# Patient Record
Sex: Female | Born: 1995 | Race: White | Hispanic: No | Marital: Single | Smoking: Former smoker
Health system: Southern US, Community
[De-identification: ages and names within clinical notes are randomized; demographics above are authoritative.]

## PROBLEM LIST (undated history)

## (undated) VITALS — BP 103/64 | HR 104 | Temp 98.4°F | Resp 16 | Ht 63.5 in | Wt 119.0 lb

## (undated) DIAGNOSIS — F32A Depression, unspecified: Secondary | ICD-10-CM

## (undated) DIAGNOSIS — K219 Gastro-esophageal reflux disease without esophagitis: Secondary | ICD-10-CM

## (undated) DIAGNOSIS — J45909 Unspecified asthma, uncomplicated: Secondary | ICD-10-CM

## (undated) DIAGNOSIS — F329 Major depressive disorder, single episode, unspecified: Secondary | ICD-10-CM

## (undated) DIAGNOSIS — N809 Endometriosis, unspecified: Secondary | ICD-10-CM

## (undated) DIAGNOSIS — T7840XA Allergy, unspecified, initial encounter: Secondary | ICD-10-CM

## (undated) HISTORY — PX: MYRINGOTOMY: SUR874

## (undated) HISTORY — PX: ADENOIDECTOMY: SUR15

## (undated) HISTORY — PX: WRIST SURGERY: SHX841

## (undated) HISTORY — PX: TONSILLECTOMY AND ADENOIDECTOMY: SUR1326

---
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
## 2016-01-20 DIAGNOSIS — Z7189 Other specified counseling: Secondary | ICD-10-CM

## 2016-01-20 HISTORY — DX: Other specified counseling: Z71.89

## 2016-03-12 DIAGNOSIS — O99333 Smoking (tobacco) complicating pregnancy, third trimester: Secondary | ICD-10-CM

## 2016-03-12 DIAGNOSIS — O99323 Drug use complicating pregnancy, third trimester: Secondary | ICD-10-CM

## 2016-03-12 DIAGNOSIS — O283 Abnormal ultrasonic finding on antenatal screening of mother: Secondary | ICD-10-CM

## 2016-03-12 HISTORY — DX: Drug use complicating pregnancy, third trimester: O99.323

## 2016-03-12 HISTORY — DX: Smoking (tobacco) complicating pregnancy, third trimester: O99.333

## 2016-03-12 HISTORY — DX: Abnormal ultrasonic finding on antenatal screening of mother: O28.3

## 2017-08-18 DIAGNOSIS — F419 Anxiety disorder, unspecified: Secondary | ICD-10-CM

## 2017-08-18 HISTORY — DX: Anxiety disorder, unspecified: F41.9

## 2019-03-15 ENCOUNTER — Other Ambulatory Visit: Payer: Self-pay

## 2019-03-15 ENCOUNTER — Encounter (HOSPITAL_COMMUNITY): Payer: Self-pay

## 2019-03-15 ENCOUNTER — Emergency Department (HOSPITAL_COMMUNITY)
Admission: EM | Admit: 2019-03-15 | Discharge: 2019-03-15 | Disposition: A | Discharge status: Home / Self Care | Payer: PRIVATE HEALTH INSURANCE | Attending: Emergency Medicine | Admitting: Emergency Medicine

## 2019-03-15 DIAGNOSIS — F17210 Nicotine dependence, cigarettes, uncomplicated: Secondary | ICD-10-CM | POA: Insufficient documentation

## 2019-03-15 DIAGNOSIS — A6000 Herpesviral infection of urogenital system, unspecified: Secondary | ICD-10-CM

## 2019-03-15 DIAGNOSIS — N760 Acute vaginitis: Secondary | ICD-10-CM | POA: Insufficient documentation

## 2019-03-15 DIAGNOSIS — B9689 Other specified bacterial agents as the cause of diseases classified elsewhere: Secondary | ICD-10-CM

## 2019-03-15 DIAGNOSIS — J45909 Unspecified asthma, uncomplicated: Secondary | ICD-10-CM | POA: Insufficient documentation

## 2019-03-15 DIAGNOSIS — Z79899 Other long term (current) drug therapy: Secondary | ICD-10-CM | POA: Insufficient documentation

## 2019-03-15 LAB — URINALYSIS, ROUTINE W REFLEX MICROSCOPIC
Bilirubin Urine: NEGATIVE
Glucose, UA: NEGATIVE mg/dL
Ketones, ur: NEGATIVE mg/dL
Nitrite: NEGATIVE
Protein, ur: NEGATIVE mg/dL
Specific Gravity, Urine: 1.016 (ref 1.005–1.030)
pH: 6 (ref 5.0–8.0)

## 2019-03-15 LAB — WET PREP, GENITAL
Sperm: NONE SEEN
Trich, Wet Prep: NONE SEEN
Yeast Wet Prep HPF POC: NONE SEEN

## 2019-03-15 LAB — POC URINE PREG, ED: Preg Test, Ur: NEGATIVE

## 2019-03-15 MED ORDER — CEFTRIAXONE SODIUM 250 MG IJ SOLR
250.0000 mg | Freq: Once | INTRAMUSCULAR | Status: AC
Start: 2019-03-15 — End: 2019-03-15
  Administered 2019-03-15: 250 mg via INTRAMUSCULAR
  Filled 2019-03-15: qty 250

## 2019-03-15 MED ORDER — METRONIDAZOLE 500 MG PO TABS: 500.0000 mg | ORAL_TABLET | Freq: Two times a day (BID) | ORAL | 0 refills | Status: DC

## 2019-03-15 MED ORDER — LIDOCAINE HCL (PF) 1 % IJ SOLN
INTRAMUSCULAR | Status: AC
  Administered 2019-03-15: 0.9 mL
  Filled 2019-03-15: qty 5

## 2019-03-15 MED ORDER — AZITHROMYCIN 250 MG PO TABS
1000.0000 mg | ORAL_TABLET | Freq: Once | ORAL | Status: AC
  Administered 2019-03-15: 1000 mg via ORAL
  Filled 2019-03-15: qty 4

## 2019-03-15 MED ORDER — ACYCLOVIR 400 MG PO TABS: 400.0000 mg | ORAL_TABLET | Freq: Four times a day (QID) | ORAL | 0 refills | Status: DC

## 2019-03-15 NOTE — Discharge Instructions (Signed)
Your rash is likely due to genital herpes.  Take acyclovir as prescribed for the full duration. Take flagyl for bacterial vaginosis.  Avoid sexual activity until symptoms resolved.

## 2019-03-15 NOTE — ED Triage Notes (Signed)
Pt from home c/o vaginal irritation, anal itching; started period Sunday, then next am vaginal noted irritation, bumps, burning while urinating; cold chills, migraine x 2 days; pt endorses darker than normal urine; denies sick contacts, denies cough

## 2019-03-15 NOTE — ED Notes (Signed)
Patient verbalizes understanding of discharge instructions. Opportunity for questioning and answers were provided. Pt discharged from ED. 

## 2019-03-15 NOTE — ED Provider Notes (Signed)
[facility] Provider Note   CSN: [id number] Arrival date & time: 03/15/19  1111     History   Chief Complaint Chief Complaint  Patient presents with  . Vaginal Itching    HPI Tracy Guzman is a 23 y.o. female.     The history is provided by the patient. No language interpreter was used.  Vaginal Itching     23 year old female presenting with complaints of vaginal discomfort.  Patient report for the past 2 to 3 days she has noticed some irritation around her vaginal area in her rectal area.  States she noticed several bumps as long as the itching sensation.  She also endorsed occasional bouts of cold chills and mild throbbing headache.  She is currently on her menstrual.  And did use a different types of tampons and pads.  She does not complain of any fever, nausea, vomiting, diarrhea, light/sound sensitivity, URI symptoms, burning with urination or rectal bleeding.  She is currently sexually active and using protection each time.  She however has had 5 different sexual partners within the past 6 months.  She is a G2 P2.  She did report remote history of pelvic inflammatory disease.   Past Medical History:  Diagnosis Date  . Allergy    Smoke, dust, Pollen  . Asthma   . Depression   . Endometriosis   . GERD (gastroesophageal reflux disease)     Patient Active Problem List   Diagnosis Date Noted  . MDD (major depressive disorder), recurrent episode, moderate (HCC) 08/16/2013  . PTSD (post-traumatic stress disorder) 08/15/2013  . Oppositional defiant disorder 06/06/2012  . Suicidal ideation 02/12/2012  . Polysubstance abuse (HCC) 02/12/2012  . BACK PAIN 10/16/2010  . ASTHMA 06/23/2010  . GERD 06/23/2010    Past Surgical History:  Procedure Laterality Date  . MYRINGOTOMY    . TONSILLECTOMY AND ADENOIDECTOMY  as infant  . WRIST SURGERY  age 23     OB History   No obstetric history on file.      Home Medications     Prior to Admission medications   Medication Sig Start Date End Date Taking? Authorizing Provider  ARIPiprazole (ABILIFY) 5 MG tablet Take 1 tablet (5 mg total) by mouth at bedtime. 08/22/13   Winson, Louie BunKim B, NP  doxycycline (ORACEA) 40 MG capsule Take 1 capsule (40 mg total) by mouth daily with breakfast. Please return home supply to patient/family.Patient may resume home supply. 08/22/13   Winson, Louie BunKim B, NP  mometasone-formoterol (DULERA) 100-5 MCG/ACT AERO Inhale 2 puffs into the lungs 2 (two) times daily.    [provider]  ondansetron (ZOFRAN ODT) 4 MG disintegrating tablet Take 1 tablet (4 mg total) by mouth every 8 (eight) hours as needed for nausea or vomiting. 05/14/14   Renne CriglerGeiple, Joshua, PA-C  venlafaxine (EFFEXOR) 75 MG tablet Take 1 tablet (75 mg total) by mouth daily with breakfast. 08/22/13   Winson, Louie BunKim B, NP    Family History Family History  Problem Relation Age of Onset  . Depression Mother   . Depression Maternal Grandmother   . Drug abuse Cousin   . Drug abuse Maternal Uncle     Social History Social History   Tobacco Use  . Smoking status: Current Every Day Smoker    Packs/day: 1.00    Years: 1.00    Pack years: 1.00    Types: Cigarettes  . Smokeless tobacco: Never Used  Substance Use Topics  . Alcohol  use: Yes    Comment: occasional  . Drug use: Not Currently    Types: Other-see comments, MDMA (Ecstacy)     Allergies   Lamotrigine   Review of Systems Review of Systems  All other systems reviewed and are negative.    Physical Exam Updated Vital Signs BP 116/71 (BP Location: Right Arm)   Pulse 80   Temp 98.4 F (36.9 C) (Oral)   Resp 16   Ht 5\' 4"  (1.626 m)   Wt 72.6 kg   LMP 03/12/2019   SpO2 98%   BMI 27.46 kg/m   Physical Exam Vitals signs and nursing note reviewed.  Constitutional:      General: She is not in acute distress.    Appearance: She is well-developed. She is not ill-appearing.  HENT:     Head: Atraumatic.  Eyes:      Conjunctiva/sclera: Conjunctivae normal.  Neck:     Musculoskeletal: Neck supple.  Cardiovascular:     Rate and Rhythm: Normal rate and regular rhythm.     Pulses: Normal pulses.     Heart sounds: Normal heart sounds.  Pulmonary:     Effort: Pulmonary effort is normal.     Breath sounds: Normal breath sounds.  Abdominal:     Palpations: Abdomen is soft.     Tenderness: There is no abdominal tenderness.  Genitourinary:    Comments: Chaperone present during exam.  No inguinal lymphadenopathy or inguinal hernia noted.  Multiple vesicular lesion noted around the vaginal labia region bilaterally mildly tender to palpation.  No significant discomfort with speculum insertion.  Cervical os is visualized and closed.  Dystrophic skin changes noted about the with friable skin changes.  Small amount of blood noted in vaginal vault. Mild discharge.  On bimanual examination, bilateral adnexal tenderness without cervical motion tenderness Skin:    Findings: No rash.  Neurological:     Mental Status: She is alert.      ED Treatments / Results  Labs (all labs ordered are listed, but only abnormal results are displayed) Labs Reviewed  WET PREP, GENITAL - Abnormal; Notable for the following components:      Result Value   Clue Cells Wet Prep HPF POC PRESENT (*)    WBC, Wet Prep HPF POC MANY (*)    All other components within normal limits  URINALYSIS, ROUTINE W REFLEX MICROSCOPIC - Abnormal; Notable for the following components:   Hgb urine dipstick LARGE (*)    Leukocytes,Ua SMALL (*)    Bacteria, UA RARE (*)    All other components within normal limits  RPR  HIV ANTIBODY (ROUTINE TESTING W REFLEX)  POC URINE PREG, ED  GC/CHLAMYDIA PROBE AMP ([place]) NOT AT [facility]    EKG None  Radiology No results found.  Procedures Procedures (including critical care time)  Medications Ordered in ED Medications  cefTRIAXone (ROCEPHIN) injection 250 mg (250 mg Intramuscular Given 03/15/19  1333)  azithromycin (ZITHROMAX) tablet 1,000 mg (1,000 mg Oral Given 03/15/19 1332)  lidocaine (PF) (XYLOCAINE) 1 % injection (0.9 mLs  Given 03/15/19 1334)     Initial Impression / Assessment and Plan / ED Course  I have reviewed the triage vital signs and the nursing notes.  Pertinent labs & imaging results that were available during my care of the patient were reviewed by me and considered in my medical decision making (see chart for details).        BP 106/71 (BP Location: Right Arm)   Pulse 69  Temp 98.4 F (36.9 C) (Oral)   Resp 16   Ht 5\' 4"  (1.626 m)   Wt 72.6 kg   LMP 03/12/2019   SpO2 98%   BMI 27.46 kg/m    Final Clinical Impressions(s) / ED Diagnoses   Final diagnoses:  Bacterial vaginosis  Genital herpes simplex, unspecified site    ED Discharge Orders         Ordered    acyclovir (ZOVIRAX) 400 MG tablet  4 times daily     03/15/19 1415    metroNIDAZOLE (FLAGYL) 500 MG tablet  2 times daily     03/15/19 1415         12:57 PM Patient here complaining of vaginal irritation and bumps around her vaginal and rectal region.  She is currently using a new pad for her menstruation which may contribute to her irritation.  Patient however has risky sexual behaviors as she has had 5 sexual partners within the past 6 months.  Therefore, will perform cover examination and evaluate for potential STI  1:13 PM Patient has multiple vesicular lesions that labial region concerning for genital herpes.  She also has discomfort on examination.  Will treat for potential STI with Rocephin and Zithromax.  Patient discharged home with a prescription for the treatment for genital herpes.   Fayrene Helperran, Stacie Templin, PA-C 03/15/19 1417    Gerhard MunchLockwood, Robert, MD 03/18/19 [phone]

## 2019-03-16 LAB — RPR: RPR Ser Ql: NONREACTIVE

## 2019-03-16 LAB — HIV ANTIBODY (ROUTINE TESTING W REFLEX): HIV Screen 4th Generation wRfx: NONREACTIVE

## 2019-03-17 LAB — GC/CHLAMYDIA PROBE AMP (~~LOC~~) NOT AT ARMC
Chlamydia: POSITIVE — AB
Neisseria Gonorrhea: NEGATIVE

## 2019-07-19 ENCOUNTER — Emergency Department (HOSPITAL_COMMUNITY): Admission: EM | Admit: 2019-07-19 | Discharge: 2019-07-19 | Discharge status: Against Medical Advice | Payer: Self-pay

## 2019-07-19 ENCOUNTER — Other Ambulatory Visit: Payer: Self-pay

## 2019-07-30 ENCOUNTER — Other Ambulatory Visit: Payer: Self-pay

## 2019-07-30 ENCOUNTER — Encounter (HOSPITAL_COMMUNITY): Payer: Self-pay | Admitting: Emergency Medicine

## 2019-07-30 ENCOUNTER — Emergency Department (HOSPITAL_COMMUNITY)
Admission: EM | Admit: 2019-07-30 | Discharge: 2019-07-31 | Disposition: A | Discharge status: Home / Self Care | Payer: Self-pay | Attending: Emergency Medicine | Admitting: Emergency Medicine

## 2019-07-30 DIAGNOSIS — E860 Dehydration: Secondary | ICD-10-CM | POA: Insufficient documentation

## 2019-07-30 DIAGNOSIS — R1115 Cyclical vomiting syndrome unrelated to migraine: Secondary | ICD-10-CM | POA: Insufficient documentation

## 2019-07-30 DIAGNOSIS — F17210 Nicotine dependence, cigarettes, uncomplicated: Secondary | ICD-10-CM | POA: Insufficient documentation

## 2019-07-30 DIAGNOSIS — Z79899 Other long term (current) drug therapy: Secondary | ICD-10-CM | POA: Insufficient documentation

## 2019-07-30 DIAGNOSIS — K5900 Constipation, unspecified: Secondary | ICD-10-CM | POA: Insufficient documentation

## 2019-07-30 DIAGNOSIS — J45909 Unspecified asthma, uncomplicated: Secondary | ICD-10-CM | POA: Insufficient documentation

## 2019-07-30 LAB — I-STAT BETA HCG BLOOD, ED (MC, WL, AP ONLY): I-stat hCG, quantitative: <5 m[IU]/mL (ref <5)

## 2019-07-30 LAB — COMPREHENSIVE METABOLIC PANEL
ALT: 19 U/L (ref 0–44)
AST: 19 U/L (ref 15–41)
Albumin: 4.6 g/dL (ref 3.5–5.0)
Alkaline Phosphatase: 56 U/L (ref 38–126)
Anion gap: 14 (ref 5–15)
BUN: <5 mg/dL — ABNORMAL LOW (ref 6–20)
CO2: 25 mmol/L (ref 22–32)
Calcium: 9.9 mg/dL (ref 8.9–10.3)
Chloride: 99 mmol/L (ref 98–111)
Creatinine, Ser: 0.9 mg/dL (ref 0.44–1.00)
GFR calc Af Amer: >60 mL/min (ref >60)
GFR calc non Af Amer: >60 mL/min (ref >60)
Glucose, Bld: 104 mg/dL — ABNORMAL HIGH (ref 70–99)
Potassium: 3.7 mmol/L (ref 3.5–5.1)
Sodium: 138 mmol/L (ref 135–145)
Total Bilirubin: 1.9 mg/dL — ABNORMAL HIGH (ref 0.3–1.2)
Total Protein: 7.7 g/dL (ref 6.5–8.1)

## 2019-07-30 LAB — CBC
HCT: 48.2 % — ABNORMAL HIGH (ref 36.0–46.0)
Hemoglobin: 15.9 g/dL — ABNORMAL HIGH (ref 12.0–15.0)
MCH: 26.2 pg (ref 26.0–34.0)
MCHC: 33 g/dL (ref 30.0–36.0)
MCV: 79.4 fL — ABNORMAL LOW (ref 80.0–100.0)
Platelets: 273 10*3/uL (ref 150–400)
RBC: 6.07 MIL/uL — ABNORMAL HIGH (ref 3.87–5.11)
RDW: 13.9 % (ref 11.5–15.5)
WBC: 5.1 10*3/uL (ref 4.0–10.5)
nRBC: 0 % (ref 0.0–0.2)

## 2019-07-30 LAB — LIPASE, BLOOD: Lipase: 31 U/L (ref 11–51)

## 2019-07-30 MED ORDER — SODIUM CHLORIDE 0.9 % IV BOLUS
1000.0000 mL | Freq: Once | INTRAVENOUS | Status: AC
  Administered 2019-07-30: 1000 mL via INTRAVENOUS

## 2019-07-30 MED ORDER — CAPSAICIN 0.025 % EX CREA
TOPICAL_CREAM | Freq: Two times a day (BID) | CUTANEOUS | Status: DC
  Filled 2019-07-30 (×2): qty 60

## 2019-07-30 MED ORDER — METOCLOPRAMIDE HCL 5 MG/ML IJ SOLN
10.0000 mg | Freq: Once | INTRAMUSCULAR | Status: AC
  Administered 2019-07-30: 10 mg via INTRAVENOUS
  Filled 2019-07-30: qty 2

## 2019-07-30 MED ORDER — DIPHENHYDRAMINE HCL 50 MG/ML IJ SOLN
12.5000 mg | Freq: Once | INTRAMUSCULAR | Status: AC
  Administered 2019-07-30: 12.5 mg via INTRAVENOUS
  Filled 2019-07-30: qty 1

## 2019-07-30 MED ORDER — SODIUM CHLORIDE 0.9% FLUSH: 3.0000 mL | Freq: Once | INTRAVENOUS | Status: DC

## 2019-07-30 NOTE — ED Triage Notes (Addendum)
C/o constipation, nausea, and vomiting x 2 weeks.  Reports generalized abd pain x 4 days.  Used enema this morning without results.

## 2019-07-30 NOTE — ED Provider Notes (Signed)
[facility] Provider Note   CSN: [id number] Arrival date & time: 07/30/19  1851     History   Chief Complaint Chief Complaint  Patient presents with  . Abdominal Pain  . Constipation    HPI Tracy FootmanKaitlyn Doebler is a 23 y.o. female with a hx of GERD, asthma, polysubstance abuse presents to the Emergency Department complaining of gradual, persistent, progressively worsening constipation onset 3 weeks ago.  Pt reports taking laxative and stool softeners, but is producing only mucous. Pt reports associated vomiting for the last 2+ weeks. She reports she cannot keep any food down.  Pt reports hx of pyelonephritis and completed abx yesterday.  Pt reports NBNB emesis approx 6 times per day.  Pt attempted enema this AM without BM.  She reports lower abd pain onset 3 days ago.  She denies hx of this, but does state hx of IBS.  Pt denies EtOH, any drug usage.  Pt reports 1ppd of cigarettes.  She reports she used to smoke marijuana but stopped 3 weeks ago. Nothing seems to make her symptoms better.  She denies fever, chills, headache, neck pain, chest pain, diarrhea, weakness, dizziness, syncope, dysuria.    History reviewed patient was [facility] on 07/20/2019 and 07/22/2019.  At that time she was diagnosed with pyelonephritis and cannabinoid hyperemesis.  She had a CT stone study on 07/20/2019 which showed a tiny nonobstructing renal stone on the right.  On 07/22/2019 she had a pelvic ultrasound and Doppler which did not show ovarian torsion, TOA or ovarian cyst.  UDS on 07/22/2019 showed THC and opiates.  She was given Rocephin in the emergency department and discharged as she had no additional vomiting.     The history is provided by the patient. No language interpreter was used.    Past Medical History:  Diagnosis Date  . Allergy    Smoke, dust, Pollen  . Asthma   . Depression   . Endometriosis   . GERD  (gastroesophageal reflux disease)     Patient Active Problem List   Diagnosis Date Noted  . MDD (major depressive disorder), recurrent episode, moderate (HCC) 08/16/2013  . PTSD (post-traumatic stress disorder) 08/15/2013  . Oppositional defiant disorder 06/06/2012  . Suicidal ideation 02/12/2012  . Polysubstance abuse (HCC) 02/12/2012  . BACK PAIN 10/16/2010  . ASTHMA 06/23/2010  . GERD 06/23/2010    Past Surgical History:  Procedure Laterality Date  . MYRINGOTOMY    . TONSILLECTOMY AND ADENOIDECTOMY  as infant  . WRIST SURGERY  age 23     OB History   No obstetric history on file.      Home Medications    Prior to Admission medications   Medication Sig Start Date End Date Taking? Authorizing Provider  acyclovir (ZOVIRAX) 400 MG tablet Take 1 tablet (400 mg total) by mouth 4 (four) times daily. 03/15/19   Fayrene Helperran, [place], PA-C  ARIPiprazole (ABILIFY) 5 MG tablet Take 1 tablet (5 mg total) by mouth at bedtime. 08/22/13   Winson, Louie BunKim B, NP  doxycycline (ORACEA) 40 MG capsule Take 1 capsule (40 mg total) by mouth daily with breakfast. Please return home supply to patient/family.Patient may resume home supply. 08/22/13   Winson, Louie BunKim B, NP  metoCLOPramide (REGLAN) 10 MG tablet Take 1 tablet (10 mg total) by mouth every 6 (six) hours as needed for nausea or vomiting. 07/31/19   Zonnique Norkus, Dahlia ClientHannah, PA-C  metroNIDAZOLE (FLAGYL) 500 MG tablet Take 1 tablet (  500 mg total) by mouth 2 (two) times daily. 03/15/19   Fayrene Helper, PA-C  mometasone-formoterol (DULERA) 100-5 MCG/ACT AERO Inhale 2 puffs into the lungs 2 (two) times daily.    [provider]  omeprazole (PRILOSEC) 20 MG capsule Take 1 capsule (20 mg total) by mouth daily. 07/31/19   Jaydee Conran, Dahlia Client, PA-C  ondansetron (ZOFRAN ODT) 4 MG disintegrating tablet Take 1 tablet (4 mg total) by mouth every 8 (eight) hours as needed for nausea or vomiting. 05/14/14   Renne Crigler, PA-C  promethazine (PHENERGAN) 25 MG suppository  Place 1 suppository (25 mg total) rectally every 6 (six) hours as needed for nausea or vomiting. 07/31/19   Peirce Deveney, Dahlia Client, PA-C  sucralfate (CARAFATE) 1 g tablet Take 1 tablet (1 g total) by mouth 4 (four) times daily -  with meals and at bedtime. 07/31/19   Iam Lipson, Dahlia Client, PA-C  venlafaxine (EFFEXOR) 75 MG tablet Take 1 tablet (75 mg total) by mouth daily with breakfast. 08/22/13   Jolene Schimke, NP    Family History Family History  Problem Relation Age of Onset  . Depression Mother   . Depression Maternal Grandmother   . Drug abuse Cousin   . Drug abuse Maternal Uncle     Social History Social History   Tobacco Use  . Smoking status: Current Every Day Smoker    Packs/day: 1.00    Years: 1.00    Pack years: 1.00    Types: Cigarettes  . Smokeless tobacco: Never Used  Substance Use Topics  . Alcohol use: Yes    Comment: occasional  . Drug use: Not Currently    Types: Other-see comments, MDMA (Ecstacy)     Allergies   Lamotrigine   Review of Systems Review of Systems  Constitutional: Negative for appetite change, diaphoresis, fatigue, fever and unexpected weight change.  HENT: Negative for mouth sores.   Eyes: Negative for visual disturbance.  Respiratory: Negative for cough, chest tightness, shortness of breath and wheezing.   Cardiovascular: Negative for chest pain.  Gastrointestinal: Positive for abdominal pain, constipation, nausea and vomiting. Negative for diarrhea.  Endocrine: Negative for polydipsia, polyphagia and polyuria.  Genitourinary: Negative for dysuria, frequency, hematuria and urgency.  Musculoskeletal: Negative for back pain and neck stiffness.  Skin: Negative for rash.  Allergic/Immunologic: Negative for immunocompromised state.  Neurological: Negative for syncope, light-headedness and headaches.  Hematological: Does not bruise/bleed easily.  Psychiatric/Behavioral: Negative for sleep disturbance. The patient is not nervous/anxious.       Physical Exam Updated Vital Signs BP 116/70   Pulse 69   Temp 98.8 F (37.1 C) (Oral)   Resp 17   LMP 07/09/2019   SpO2 100%   Physical Exam Vitals signs and nursing note reviewed.  Constitutional:      General: She is not in acute distress.    Appearance: She is not diaphoretic.  HENT:     Head: Normocephalic.  Eyes:     General: No scleral icterus.    Conjunctiva/sclera: Conjunctivae normal.  Neck:     Musculoskeletal: Normal range of motion.  Cardiovascular:     Rate and Rhythm: Normal rate and regular rhythm.     Pulses: Normal pulses.          Radial pulses are 2+ on the right side and 2+ on the left side.  Pulmonary:     Effort: No tachypnea, accessory muscle usage, prolonged expiration, respiratory distress or retractions.     Breath sounds: No stridor.  Comments: Equal chest rise. No increased work of breathing. Abdominal:     General: There is no distension.     Palpations: Abdomen is soft.     Tenderness: There is no abdominal tenderness. There is no right CVA tenderness, left CVA tenderness, guarding or rebound. Negative signs include Murphy's sign and McBurney's sign.  Musculoskeletal:     Comments: Moves all extremities equally and without difficulty.  Skin:    General: Skin is warm and dry.     Capillary Refill: Capillary refill takes less than 2 seconds.  Neurological:     Mental Status: She is alert.     GCS: GCS eye subscore is 4. GCS verbal subscore is 5. GCS motor subscore is 6.     Comments: Speech is clear and goal oriented.  Psychiatric:        Mood and Affect: Mood normal.      ED Treatments / Results  Labs (all labs ordered are listed, but only abnormal results are displayed) Labs Reviewed  COMPREHENSIVE METABOLIC PANEL - Abnormal; Notable for the following components:      Result Value   Glucose, Bld 104 (*)    BUN <5 (*)    Total Bilirubin 1.9 (*)    All other components within normal limits  CBC - Abnormal; Notable for  the following components:   RBC 6.07 (*)    Hemoglobin 15.9 (*)    HCT 48.2 (*)    MCV 79.4 (*)    All other components within normal limits  URINALYSIS, ROUTINE W REFLEX MICROSCOPIC - Abnormal; Notable for the following components:   Color, Urine AMBER (*)    APPearance HAZY (*)    Ketones, ur 80 (*)    Protein, ur 100 (*)    All other components within normal limits  RAPID URINE DRUG SCREEN, HOSP PERFORMED - Abnormal; Notable for the following components:   Tetrahydrocannabinol POSITIVE (*)    All other components within normal limits  LIPASE, BLOOD  I-STAT BETA HCG BLOOD, ED ([facility], [facility], [facility] ONLY)     Radiology Dg Abd Acute W/chest  Result Date: 07/31/2019 CLINICAL DATA:  Abdominal pain with constipation and vomiting EXAM: DG ABDOMEN ACUTE W/ 1V CHEST COMPARISON:  None. FINDINGS: There is no evidence of dilated bowel loops or free intraperitoneal air. No radiopaque calculi or other significant radiographic abnormality is seen. Heart size and mediastinal contours are within normal limits. Both lungs are clear. IMPRESSION: Negative abdominal radiographs.  No acute cardiopulmonary disease. Electronically Signed   By: Deatra Robinson M.D.   On: 07/31/2019 00:40    Procedures Procedures (including critical care time)  Medications Ordered in ED Medications  sodium chloride flush (NS) 0.9 % injection 3 mL (3 mLs Intravenous Not Given 07/31/19 0005)  capsaicin (ZOSTRIX) 0.025 % cream ( Topical Not Given 07/31/19 0040)  metoCLOPramide (REGLAN) injection 10 mg (10 mg Intravenous Given 07/30/19 2350)  diphenhydrAMINE (BENADRYL) injection 12.5 mg (12.5 mg Intravenous Given 07/30/19 2351)  sodium chloride 0.9 % bolus 1,000 mL (0 mLs Intravenous Stopped 07/31/19 0057)  sodium chloride 0.9 % bolus 500 mL (0 mLs Intravenous Stopped 07/31/19 0203)     Initial Impression / Assessment and Plan / ED Course  I have reviewed the triage vital signs and the nursing notes.  Pertinent labs & imaging  results that were available during my care of the patient were reviewed by me and considered in my medical decision making (see chart for details).  Clinical Course as of  Jul 31 203  Mon Jul 31, 2019  0045 Linward Foster. Will give additional fluids  Ketones, ur(!): 80 [HM]  0048 Proteinuria.  Noted on previous UAs  Protein(!): 100 [HM]  0048 Tetrahydrocannabinol(!): POSITIVE [HM]  0048 hemoconcentrated  Hemoglobin(!): 15.9 [HM]  0049 WNL  Potassium: 3.7 [HM]  0105 Plain films without evidence of bowel obstruction.  Chest x-ray clear.  I personally evaluated these images.  DG Abd Acute W/Chest [HM]  0202 Patient has tolerated p.o. without difficulty.  On repeat exam her abdomen is soft and nontender.   [HM]    Clinical Course User Index [HM] Shahira Fiske, Jarrett Soho, [place]        Patient presents with complaints of constipation and emesis for several weeks.  She has been evaluated several times in the last few weeks at [facility].  On exam, abdomen soft and nontender.  Highly doubt diverticulitis, colitis, appendicitis, cholecystitis.  Initial labs are overall reassuring.  No electrolyte abnormalities, elevation in AST ALT or elevated anion gap.  Doubt cholecystitis, appendicitis.  Patient does appear to be hemoconcentrated.  Will give fluids.  No leukocytosis.  Patient is not pregnant.  UA and UDS pending.  Plain films without evidence of bowel obstruction.  Clinical picture not consistent with SBO.  I personally evaluated the images.  2:04 AM UA without evidence of urinary tract infection.  No flank pain to suggest recurrent pyelonephritis.  UDS is positive for THC.  Long discussion with patient about cannabinoid induced hyperemesis versus other causes of cyclic vomiting.  She has received fluids here in the emergency department.  No emesis while under my care.  She has tolerated p.o. without difficulty.  Also discussed slowly advancing diet.  She will need GI  follow-up.  Discussed reasons to return to the emergency department.  Final Clinical Impressions(s) / ED Diagnoses   Final diagnoses:  Cyclical vomiting  Dehydration    ED Discharge Orders         Ordered    metoCLOPramide (REGLAN) 10 MG tablet  Every 6 hours PRN     07/31/19 0201    omeprazole (PRILOSEC) 20 MG capsule  Daily     07/31/19 0201    promethazine (PHENERGAN) 25 MG suppository  Every 6 hours PRN     07/31/19 0201    sucralfate (CARAFATE) 1 g tablet  3 times daily with meals & bedtime     07/31/19 0201           Jamari Moten, Jarrett Soho, PA-C 07/31/19 0205    Fatima Blank, MD 07/31/19 [phone]

## 2019-07-31 ENCOUNTER — Emergency Department (HOSPITAL_COMMUNITY): Payer: Self-pay

## 2019-07-31 LAB — URINALYSIS, ROUTINE W REFLEX MICROSCOPIC
Bacteria, UA: NONE SEEN
Bilirubin Urine: NEGATIVE
Glucose, UA: NEGATIVE mg/dL
Hgb urine dipstick: NEGATIVE
Ketones, ur: 80 mg/dL — AB
Leukocytes,Ua: NEGATIVE
Nitrite: NEGATIVE
Protein, ur: 100 mg/dL — AB
Specific Gravity, Urine: 1.029 (ref 1.005–1.030)
pH: 6 (ref 5.0–8.0)

## 2019-07-31 LAB — RAPID URINE DRUG SCREEN, HOSP PERFORMED
Amphetamines: NOT DETECTED
Barbiturates: NOT DETECTED
Benzodiazepines: NOT DETECTED
Cocaine: NOT DETECTED
Opiates: NOT DETECTED
Tetrahydrocannabinol: POSITIVE — AB

## 2019-07-31 MED ORDER — SUCRALFATE 1 G PO TABS: 1.0000 g | ORAL_TABLET | Freq: Three times a day (TID) | ORAL | 0 refills | Status: DC

## 2019-07-31 MED ORDER — SODIUM CHLORIDE 0.9 % IV BOLUS
500.0000 mL | Freq: Once | INTRAVENOUS | Status: AC
  Administered 2019-07-31: 500 mL via INTRAVENOUS

## 2019-07-31 MED ORDER — OMEPRAZOLE 20 MG PO CPDR: 20.0000 mg | DELAYED_RELEASE_CAPSULE | Freq: Every day | ORAL | 0 refills | Status: DC

## 2019-07-31 MED ORDER — PROMETHAZINE HCL 25 MG RE SUPP: 25.0000 mg | Freq: Four times a day (QID) | RECTAL | 0 refills | Status: DC | PRN

## 2019-07-31 MED ORDER — METOCLOPRAMIDE HCL 10 MG PO TABS: 10.0000 mg | ORAL_TABLET | Freq: Four times a day (QID) | ORAL | 0 refills | Status: DC | PRN

## 2019-07-31 NOTE — Discharge Instructions (Signed)
1. Medications: Phenergan suppository, Reglan, Carafate, omeprazole, usual home medications 2. Treatment: rest, drink plenty of fluids, advance diet slowly starting with clear liquids for several days 3. Follow Up: Please followup with your primary doctor in 2 days for discussion of your diagnoses and further evaluation after today's visit; if you do not have a primary care doctor use the resource guide provided to find one; Please return to the ER for persistent vomiting, high fevers or worsening symptoms

## 2019-09-19 ENCOUNTER — Other Ambulatory Visit: Payer: Self-pay

## 2019-09-19 ENCOUNTER — Ambulatory Visit
Admission: EM | Admit: 2019-09-19 | Discharge: 2019-09-19 | Disposition: A | Discharge status: Home / Self Care | Payer: Self-pay | Attending: Emergency Medicine | Admitting: Emergency Medicine

## 2019-09-19 ENCOUNTER — Encounter: Payer: Self-pay | Admitting: Emergency Medicine

## 2019-09-19 DIAGNOSIS — F17200 Nicotine dependence, unspecified, uncomplicated: Secondary | ICD-10-CM

## 2019-09-19 DIAGNOSIS — H65194 Other acute nonsuppurative otitis media, recurrent, right ear: Secondary | ICD-10-CM

## 2019-09-19 DIAGNOSIS — Z20822 Contact with and (suspected) exposure to covid-19: Secondary | ICD-10-CM

## 2019-09-19 DIAGNOSIS — R0981 Nasal congestion: Secondary | ICD-10-CM

## 2019-09-19 MED ORDER — OFLOXACIN 0.3 % OT SOLN: 3.0000 [drp] | Freq: Two times a day (BID) | OTIC | 0 refills | Status: AC

## 2019-09-19 MED ORDER — AMOXICILLIN-POT CLAVULANATE 875-125 MG PO TABS: 1.0000 | ORAL_TABLET | Freq: Two times a day (BID) | ORAL | 0 refills | Status: DC

## 2019-09-19 MED ORDER — FLUCONAZOLE 200 MG PO TABS: 200.0000 mg | ORAL_TABLET | Freq: Once | ORAL | 0 refills | Status: AC

## 2019-09-19 NOTE — ED Triage Notes (Signed)
Pt presents to [facility] for assessment of right ear pain x 3 days with discharge, and this morning states she noted blood.  Pt c/o sore throat, nasal congestion x 4 days.

## 2019-09-19 NOTE — ED Provider Notes (Signed)
[facility]    CSN: [id number] Arrival date & time: 09/19/19  0956      History   Chief Complaint Chief Complaint  Patient presents with  . URI    HPI Tracy Guzman is a 24 y.o. female with history of allergies, asthma, history of ear infections presenting for right ear pain for last 3 days.  Has had preceding sore throat, nasal congestion for last 4 days.  Patient taking OTC medications with adequate relief of symptoms.  Patient states she put a cotton ball in her right ear last night, noticed blood this morning.  Does endorse mild decreased hearing right as compared to left.  Denies trauma, recent travel, prolonged water exposure.  No fever, chills, cough, shortness of breath.  No known Covid contacts.   Past Medical History:  Diagnosis Date  . Allergy    Smoke, dust, Pollen  . Asthma   . Depression   . Endometriosis   . GERD (gastroesophageal reflux disease)     Patient Active Problem List   Diagnosis Date Noted  . MDD (major depressive disorder), recurrent episode, moderate ([place]) 08/16/2013  . PTSD (post-traumatic stress disorder) 08/15/2013  . Oppositional defiant disorder 06/06/2012  . Suicidal ideation 02/12/2012  . Polysubstance abuse ([place]) 02/12/2012  . BACK PAIN 10/16/2010  . ASTHMA 06/23/2010  . GERD 06/23/2010    Past Surgical History:  Procedure Laterality Date  . MYRINGOTOMY    . TONSILLECTOMY AND ADENOIDECTOMY  as infant  . WRIST SURGERY  age 36    OB History   No obstetric history on file.      Home Medications    Prior to Admission medications   Medication Sig Start Date End Date Taking? Authorizing Provider  acyclovir (ZOVIRAX) 400 MG tablet Take 1 tablet (400 mg total) by mouth 4 (four) times daily. 03/15/19   Domenic Moras, PA-C  amoxicillin-clavulanate (AUGMENTIN) 875-125 MG tablet Take 1 tablet by mouth every 12 (twelve) hours. 09/19/19   Hall-Potvin, [place], PA-C  ARIPiprazole (ABILIFY) 5 MG tablet Take 1 tablet (5 mg  total) by mouth at bedtime. 08/22/13   Winson, Manus Rudd, NP  fluconazole (DIFLUCAN) 200 MG tablet Take 1 tablet (200 mg total) by mouth once for 1 dose. May repeat in 72 hours if needed 09/19/19 09/19/19  Hall-Potvin, [place], PA-C  metoCLOPramide (REGLAN) 10 MG tablet Take 1 tablet (10 mg total) by mouth every 6 (six) hours as needed for nausea or vomiting. 07/31/19   Muthersbaugh, Jarrett Soho, PA-C  mometasone-formoterol (DULERA) 100-5 MCG/ACT AERO Inhale 2 puffs into the lungs 2 (two) times daily.    [provider]  ofloxacin (FLOXIN) 0.3 % OTIC solution Place 3 drops into the right ear 2 (two) times daily for 7 days. 09/19/19 09/26/19  Hall-Potvin, [place], PA-C  omeprazole (PRILOSEC) 20 MG capsule Take 1 capsule (20 mg total) by mouth daily. 07/31/19   Muthersbaugh, Jarrett Soho, PA-C  ondansetron (ZOFRAN ODT) 4 MG disintegrating tablet Take 1 tablet (4 mg total) by mouth every 8 (eight) hours as needed for nausea or vomiting. 05/14/14   Carlisle Cater, PA-C  promethazine (PHENERGAN) 25 MG suppository Place 1 suppository (25 mg total) rectally every 6 (six) hours as needed for nausea or vomiting. 07/31/19   Muthersbaugh, Jarrett Soho, PA-C  sucralfate (CARAFATE) 1 g tablet Take 1 tablet (1 g total) by mouth 4 (four) times daily -  with meals and at bedtime. 07/31/19   Muthersbaugh, Jarrett Soho, PA-C  venlafaxine (EFFEXOR) 75 MG tablet Take 1 tablet (75  mg total) by mouth daily with breakfast. 08/22/13   Jolene Schimke, NP    Family History Family History  Problem Relation Age of Onset  . Depression Mother   . Depression Maternal Grandmother   . Drug abuse Cousin   . Drug abuse Maternal Uncle     Social History Social History   Tobacco Use  . Smoking status: Current Every Day Smoker    Packs/day: 1.00    Years: 1.00    Pack years: 1.00    Types: Cigarettes  . Smokeless tobacco: Never Used  Substance Use Topics  . Alcohol use: Yes    Comment: occasional  . Drug use: Not Currently    Types:  Other-see comments, MDMA (Ecstacy)     Allergies   Lamotrigine   Review of Systems As per HPI   Physical Exam Triage Vital Signs ED Triage Vitals [09/19/19 1005]  Enc Vitals Group     BP 108/74     Pulse Rate 85     Resp 16     Temp 97.8 F (36.6 C)     Temp Source Temporal     SpO2 99 %     Weight      Height      Head Circumference      Peak Flow      Pain Score 6     Pain Loc      Pain Edu?      Excl. in GC?    No data found.  Updated Vital Signs BP 108/74 (BP Location: Left Arm)   Pulse 85   Temp 97.8 F (36.6 C) (Temporal)   Resp 16   LMP 09/01/2019   SpO2 99%   Visual Acuity Right Eye Distance:   Left Eye Distance:   Bilateral Distance:    Right Eye Near:   Left Eye Near:    Bilateral Near:     Physical Exam Constitutional:      General: She is not in acute distress.    Appearance: She is normal weight. She is not ill-appearing or diaphoretic.  HENT:     Head: Normocephalic and atraumatic.     Jaw: There is normal jaw occlusion. No tenderness or pain on movement.     Right Ear: Hearing and external ear normal. No tenderness. No mastoid tenderness.     Left Ear: Hearing, tympanic membrane, ear canal and external ear normal. No tenderness. No mastoid tenderness.     Ears:     Comments: Negative tragal tenderness bilaterally.  EAC with moderate edema, erythema.  Does have curd-like discharge.  Unable to visualize TM.    Nose: Nose normal. No nasal deformity, septal deviation or nasal tenderness.     Right Turbinates: Not swollen or pale.     Left Turbinates: Not swollen or pale.     Right Sinus: No maxillary sinus tenderness or frontal sinus tenderness.     Left Sinus: No maxillary sinus tenderness or frontal sinus tenderness.     Comments: Negative sinus tenderness bilaterally    Mouth/Throat:     Lips: Pink. No lesions.     Mouth: Mucous membranes are moist. No injury.     Pharynx: Oropharynx is clear. Uvula midline. No posterior  oropharyngeal erythema or uvula swelling.     Comments: no tonsillar exudate or hypertrophy Eyes:     General: No scleral icterus.    Conjunctiva/sclera: Conjunctivae normal.     Pupils: Pupils are equal, round, and reactive to  light.  Neck:     Comments: Right-sided LAD Cardiovascular:     Rate and Rhythm: Normal rate and regular rhythm.  Pulmonary:     Effort: Pulmonary effort is normal. No respiratory distress.     Breath sounds: No wheezing.  Musculoskeletal:     Cervical back: Normal range of motion and neck supple. Tenderness present. No muscular tenderness.  Lymphadenopathy:     Cervical: Cervical adenopathy present.  Neurological:     Mental Status: She is alert and oriented to person, place, and time.      UC Treatments / Results  Labs (all labs ordered are listed, but only abnormal results are displayed) Labs Reviewed  NOVEL CORONAVIRUS, NAA    EKG   Radiology No results found.  Procedures Procedures (including critical care time)  Medications Ordered in UC Medications - No data to display  Initial Impression / Assessment and Plan / UC Course  I have reviewed the triage vital signs and the nursing notes.  Pertinent labs & imaging results that were available during my care of the patient were reviewed by me and considered in my medical decision making (see chart for details).     Patient afebrile, nontoxic.  Due to nasal congestion, sore throat Covid testing was obtained-patient to quarantine till results are back.  H&P more concerning for AOM vs AOE: We will cover for both with suspected perforated TM as outlined below.  Patient does not PCP to follow-up with: Provided contact information as well as return precautions.  Patient verbalized understanding and is agreeable to plan. Final Clinical Impressions(s) / UC Diagnoses   Final diagnoses:  Nasal congestion  Other recurrent acute nonsuppurative otitis media of right ear     Discharge Instructions      Use ear drops as directed. Take antibiotic with food. May take first diflucan tab w/ last dose of antibiotic & repeat in 3 days if needed. Return for worsening pain, swelling, fever.    ED Prescriptions    Medication Sig Dispense Auth. Provider   ofloxacin (FLOXIN) 0.3 % OTIC solution Place 3 drops into the right ear 2 (two) times daily for 7 days. 5 mL Hall-Potvin, [place], PA-C   amoxicillin-clavulanate (AUGMENTIN) 875-125 MG tablet Take 1 tablet by mouth every 12 (twelve) hours. 14 tablet Hall-Potvin, [place], PA-C   fluconazole (DIFLUCAN) 200 MG tablet Take 1 tablet (200 mg total) by mouth once for 1 dose. May repeat in 72 hours if needed 2 tablet Hall-Potvin, [place], PA-C     PDMP not reviewed this encounter.   Hall-Potvin, [place], [place] 09/19/19 1028

## 2019-09-19 NOTE — Discharge Instructions (Addendum)
Use ear drops as directed. Take antibiotic with food. May take first diflucan tab w/ last dose of antibiotic & repeat in 3 days if needed. Return for worsening pain, swelling, fever.

## 2019-09-20 LAB — NOVEL CORONAVIRUS, NAA: SARS-CoV-2, NAA: NOT DETECTED

## 2019-10-23 ENCOUNTER — Other Ambulatory Visit: Payer: Self-pay

## 2019-10-23 ENCOUNTER — Emergency Department (HOSPITAL_COMMUNITY)
Admission: EM | Admit: 2019-10-23 | Discharge: 2019-10-23 | Disposition: A | Discharge status: Home / Self Care | Payer: Self-pay | Attending: Emergency Medicine | Admitting: Emergency Medicine

## 2019-10-23 ENCOUNTER — Encounter (HOSPITAL_COMMUNITY): Payer: Self-pay | Admitting: Emergency Medicine

## 2019-10-23 DIAGNOSIS — Z202 Contact with and (suspected) exposure to infections with a predominantly sexual mode of transmission: Secondary | ICD-10-CM | POA: Insufficient documentation

## 2019-10-23 DIAGNOSIS — N898 Other specified noninflammatory disorders of vagina: Secondary | ICD-10-CM | POA: Insufficient documentation

## 2019-10-23 DIAGNOSIS — F17210 Nicotine dependence, cigarettes, uncomplicated: Secondary | ICD-10-CM | POA: Insufficient documentation

## 2019-10-23 DIAGNOSIS — Z79899 Other long term (current) drug therapy: Secondary | ICD-10-CM | POA: Insufficient documentation

## 2019-10-23 LAB — URINALYSIS, ROUTINE W REFLEX MICROSCOPIC
Bacteria, UA: NONE SEEN
Bilirubin Urine: NEGATIVE
Glucose, UA: NEGATIVE mg/dL
Ketones, ur: NEGATIVE mg/dL
Leukocytes,Ua: NEGATIVE
Nitrite: NEGATIVE
Protein, ur: NEGATIVE mg/dL
Specific Gravity, Urine: 1.02 (ref 1.005–1.030)
pH: 9 — ABNORMAL HIGH (ref 5.0–8.0)

## 2019-10-23 LAB — WET PREP, GENITAL
Sperm: NONE SEEN
Trich, Wet Prep: NONE SEEN
Yeast Wet Prep HPF POC: NONE SEEN

## 2019-10-23 LAB — HIV ANTIBODY (ROUTINE TESTING W REFLEX): HIV Screen 4th Generation wRfx: NONREACTIVE

## 2019-10-23 LAB — POC URINE PREG, ED: Preg Test, Ur: NEGATIVE

## 2019-10-23 MED ORDER — DOXYCYCLINE HYCLATE 100 MG PO CAPS: 100.0000 mg | ORAL_CAPSULE | Freq: Two times a day (BID) | ORAL | 0 refills | Status: DC

## 2019-10-23 MED ORDER — STERILE WATER FOR INJECTION IJ SOLN
INTRAMUSCULAR | Status: AC
  Filled 2019-10-23: qty 10

## 2019-10-23 MED ORDER — CEFTRIAXONE SODIUM 500 MG IJ SOLR
500.0000 mg | Freq: Once | INTRAMUSCULAR | Status: AC
  Administered 2019-10-23: 500 mg via INTRAMUSCULAR
  Filled 2019-10-23: qty 500

## 2019-10-23 NOTE — ED Notes (Signed)
Patient verbalizes understanding of discharge instructions. Opportunity for questioning and answers were provided. Armband removed by staff, pt discharged from ED ambulatory to home.  

## 2019-10-23 NOTE — Discharge Instructions (Signed)
Please read and follow all provided instructions.  Your diagnoses today include:  1. STD exposure   2. Vaginal irritation     Tests performed today include:  Test for gonorrhea and chlamydia.   Wet prep - no trichomonas or yeast  Test for HIV and syphilis.   You will be notified by telephone with any positive results.   Vital signs. See below for your results today.   Medications:  For treatment of gonorrhea: You were treated with a rocephin (shot) today.   For treatment of chlamydia: Please hold the prescription for doxycycline until the results return.  If you test positive for chlamydia you should fill the doxycycline and take the entire prescription.  If you test negative, you can discard the prescription. You can check your results on the internet through your MyChart. Results typically return in 48 hours.   Home care instructions:  Read educational materials contained in this packet and follow any instructions provided.   You should tell your partners about your infection and avoid having sex for one week to allow time for the medicine to work.  Sexually transmitted disease testing also available at:   [facility] [place], [facility]  [address], [place], phone [phone] or [phone]    Monday - Friday, call for an appointment  Return instructions:   Please return to the Emergency Department if you experience worsening symptoms.   Please return if you have any other emergent concerns.  Additional Information:  Your vital signs today were: BP 108/71 (BP Location: Right Arm)   Pulse 80   Temp 98.3 F (36.8 C) (Oral)   Resp 16   Ht 5\' 4"  (1.626 m)   Wt 63.5 kg   SpO2 100%   BMI 24.03 kg/m  If your blood pressure (BP) was elevated above 135/85 this visit, please have this repeated by your doctor within one month. --------------

## 2019-10-23 NOTE — ED Triage Notes (Signed)
Pt reports exposure to STD and has burning when peeing. Vaginal redness and irration.

## 2019-10-23 NOTE — ED Provider Notes (Signed)
[facility] EMERGENCY DEPARTMENT Provider Note   CSN: [id number] Arrival date & time: 10/23/19  1453     History Chief Complaint  Patient presents with  . Exposure to STD    Tracy Guzman is a 24 y.o. female.  Patient presents with complaint of vaginal irritation and dysuria ongoing over the past several days after being told by a previous partner that they tested positive for sexually transmitted infection.  Patient does not know the specific infection.  She denies any significant vaginal discharge.  No fevers, abdominal pain, diarrhea.  No increased frequency urgency, hematuria.  Patient states that she was last tested for HIV and syphilis about 4 months ago and is requesting testing today.  The onset of this condition was acute. The course is constant. Aggravating factors: none. Alleviating factors: none.           Past Medical History:  Diagnosis Date  . Allergy    Smoke, dust, Pollen  . Asthma   . Depression   . Endometriosis   . GERD (gastroesophageal reflux disease)     Patient Active Problem List   Diagnosis Date Noted  . MDD (major depressive disorder), recurrent episode, moderate (HCC) 08/16/2013  . PTSD (post-traumatic stress disorder) 08/15/2013  . Oppositional defiant disorder 06/06/2012  . Suicidal ideation 02/12/2012  . Polysubstance abuse (HCC) 02/12/2012  . BACK PAIN 10/16/2010  . ASTHMA 06/23/2010  . GERD 06/23/2010    Past Surgical History:  Procedure Laterality Date  . MYRINGOTOMY    . TONSILLECTOMY AND ADENOIDECTOMY  as infant  . WRIST SURGERY  age 38     OB History   No obstetric history on file.     Family History  Problem Relation Age of Onset  . Depression Mother   . Depression Maternal Grandmother   . Drug abuse Cousin   . Drug abuse Maternal Uncle     Social History   Tobacco Use  . Smoking status: Current Every Day Smoker    Packs/day: 1.00    Years: 1.00    Pack years: 1.00    Types:  Cigarettes  . Smokeless tobacco: Never Used  Substance Use Topics  . Alcohol use: Yes    Comment: occasional  . Drug use: Not Currently    Types: Other-see comments, MDMA (Ecstacy)    Home Medications Prior to Admission medications   Medication Sig Start Date End Date Taking? Authorizing Provider  acyclovir (ZOVIRAX) 400 MG tablet Take 1 tablet (400 mg total) by mouth 4 (four) times daily. 03/15/19   Fayrene Helper, PA-C  amoxicillin-clavulanate (AUGMENTIN) 875-125 MG tablet Take 1 tablet by mouth every 12 (twelve) hours. 09/19/19   Hall-Potvin, [place], PA-C  ARIPiprazole (ABILIFY) 5 MG tablet Take 1 tablet (5 mg total) by mouth at bedtime. 08/22/13   Winson, Louie Bun, NP  metoCLOPramide (REGLAN) 10 MG tablet Take 1 tablet (10 mg total) by mouth every 6 (six) hours as needed for nausea or vomiting. 07/31/19   Muthersbaugh, Dahlia Client, PA-C  mometasone-formoterol (DULERA) 100-5 MCG/ACT AERO Inhale 2 puffs into the lungs 2 (two) times daily.    [provider]  omeprazole (PRILOSEC) 20 MG capsule Take 1 capsule (20 mg total) by mouth daily. 07/31/19   Muthersbaugh, Dahlia Client, PA-C  ondansetron (ZOFRAN ODT) 4 MG disintegrating tablet Take 1 tablet (4 mg total) by mouth every 8 (eight) hours as needed for nausea or vomiting. 05/14/14   Renne Crigler, PA-C  promethazine (PHENERGAN) 25 MG suppository Place 1 suppository (  25 mg total) rectally every 6 (six) hours as needed for nausea or vomiting. 07/31/19   Muthersbaugh, Dahlia Client, PA-C  sucralfate (CARAFATE) 1 g tablet Take 1 tablet (1 g total) by mouth 4 (four) times daily -  with meals and at bedtime. 07/31/19   Muthersbaugh, Dahlia Client, PA-C  venlafaxine (EFFEXOR) 75 MG tablet Take 1 tablet (75 mg total) by mouth daily with breakfast. 08/22/13   Jolene Schimke, NP    Allergies    Lamotrigine  Review of Systems   Review of Systems  Constitutional: Negative for fever.  HENT: Negative for sore throat.   Eyes: Negative for discharge.  Gastrointestinal:  Negative for rectal pain.  Genitourinary: Positive for dysuria and vaginal pain (irritation). Negative for frequency, genital sores, pelvic pain, vaginal bleeding and vaginal discharge.  Musculoskeletal: Negative for arthralgias.  Skin: Negative for rash.  Hematological: Negative for adenopathy.    Physical Exam Updated Vital Signs BP 108/71 (BP Location: Right Arm)   Pulse 80   Temp 98.3 F (36.8 C) (Oral)   Resp 16   Ht 5\' 4"  (1.626 m)   Wt 63.5 kg   SpO2 100%   BMI 24.03 kg/m   Physical Exam Vitals and nursing note reviewed. Exam conducted with a chaperone present.  Constitutional:      Appearance: She is well-developed.  HENT:     Head: Normocephalic and atraumatic.  Eyes:     General:        Right eye: No discharge.        Left eye: No discharge.     Conjunctiva/sclera: Conjunctivae normal.  Cardiovascular:     Rate and Rhythm: Normal rate and regular rhythm.     Heart sounds: Normal heart sounds.  Pulmonary:     Effort: Pulmonary effort is normal.     Breath sounds: Normal breath sounds.  Abdominal:     Palpations: Abdomen is soft.     Tenderness: There is no abdominal tenderness.  Genitourinary:    Exam position: Lithotomy position.     Labia:        Right: No rash, tenderness or lesion.        Left: No rash, tenderness or lesion.      Vagina: Bleeding present. No vaginal discharge or tenderness.     Cervix: Cervical bleeding present.     Uterus: Not tender.      Adnexa:        Right: No mass or tenderness.         Left: No mass or tenderness.    Musculoskeletal:     Cervical back: Normal range of motion and neck supple.  Skin:    General: Skin is warm and dry.  Neurological:     Mental Status: She is alert.     ED Results / Procedures / Treatments   Labs (all labs ordered are listed, but only abnormal results are displayed) Labs Reviewed  URINALYSIS, ROUTINE W REFLEX MICROSCOPIC - Abnormal; Notable for the following components:      Result  Value   APPearance CLOUDY (*)    pH 9.0 (*)    Hgb urine dipstick SMALL (*)    All other components within normal limits  WET PREP, GENITAL  RPR  HIV ANTIBODY (ROUTINE TESTING W REFLEX)  POC URINE PREG, ED  GC/CHLAMYDIA PROBE AMP ([place]) NOT AT [facility]    EKG None  Radiology No results found.  Procedures Procedures (including critical care time)  Medications Ordered in ED  Medications - No data to display  ED Course  I have reviewed the triage vital signs and the nursing notes.  Pertinent labs & imaging results that were available during my care of the patient were reviewed by me and considered in my medical decision making (see chart for details).  Patient seen and examined.  Will proceed with pelvic exam.    Vital signs reviewed and are as follows: BP 108/71 (BP Location: Right Arm)   Pulse 80   Temp 98.3 F (36.8 C) (Oral)   Resp 16   Ht 5\' 4"  (1.626 m)   Wt 63.5 kg   SpO2 100%   BMI 24.03 kg/m   5:01 PM Pelvic performed with NT Danielle chaperone.   5:32 PM wet prep without signs of trichomonas.  Exam is not really consistent with bacterial vaginosis and will not treat for this.  Patient encouraged to follow-up with chlamydia test and take Doxy if this is positive.  Otherwise follow-up with PCP/OB/GYN as needed for symptoms.      MDM Rules/Calculators/A&P                      Patient with vaginal irritation in setting of recent STD exposure.  Currently on her menstrual period.  No cervical motion tenderness or other signs of PID at this point.  Patient treated for gonorrhea.  Chlamydia testing pending.  No signs of trichomonas.  No abdominal tenderness.  Final Clinical Impression(s) / ED Diagnoses Final diagnoses:  STD exposure  Vaginal irritation    Rx / DC Orders ED Discharge Orders    None       Carlisle Cater, PA-C 10/23/19 1733    Wyvonnia Dusky, MD 10/24/19 1208

## 2019-10-24 LAB — RPR: RPR Ser Ql: NONREACTIVE

## 2019-10-25 LAB — GC/CHLAMYDIA PROBE AMP (~~LOC~~) NOT AT ARMC
Chlamydia: NEGATIVE
Neisseria Gonorrhea: NEGATIVE

## 2020-03-04 ENCOUNTER — Ambulatory Visit
Admission: EM | Admit: 2020-03-04 | Discharge: 2020-03-04 | Disposition: A | Discharge status: Home / Self Care | Payer: Self-pay | Attending: Emergency Medicine | Admitting: Emergency Medicine

## 2020-03-04 ENCOUNTER — Other Ambulatory Visit: Payer: Self-pay

## 2020-03-04 ENCOUNTER — Encounter: Payer: Self-pay | Admitting: Emergency Medicine

## 2020-03-04 DIAGNOSIS — Z7251 High risk heterosexual behavior: Secondary | ICD-10-CM | POA: Insufficient documentation

## 2020-03-04 DIAGNOSIS — N750 Cyst of Bartholin's gland: Secondary | ICD-10-CM | POA: Insufficient documentation

## 2020-03-04 MED ORDER — NAPROXEN 500 MG PO TABS: 500.0000 mg | ORAL_TABLET | Freq: Two times a day (BID) | ORAL | 0 refills | Status: DC

## 2020-03-04 NOTE — Discharge Instructions (Signed)
Naproxen 2 times a day with food. Sitz bath as discussed (or hot compress/rag) 5 times a day for 10 min. Return for worsening swelling, pain, fever, discharge.

## 2020-03-04 NOTE — ED Provider Notes (Signed)
[facility]    CSN: [id number] Arrival date & time: 03/04/20  1429      History   Chief Complaint Chief Complaint  Patient presents with  . Bump to Groin    HPI Tracy Guzman is a 24 y.o. female presenting for lump to right lower labia x4-5 days.  States it was preceded by colitis.  Patient denying pelvic pain, vaginal pain, open wound or discharge.  No vaginal discharge.  Patient does have unprotected intercourse: Wanting STI testing including HIV, syphilis.  Denies history thereof.  No urinary symptoms such as frequency, urgency.  No abdominal pain, back pain, fever.   Past Medical History:  Diagnosis Date  . Allergy    Smoke, dust, Pollen  . Asthma   . Depression   . Endometriosis   . GERD (gastroesophageal reflux disease)     Patient Active Problem List   Diagnosis Date Noted  . MDD (major depressive disorder), recurrent episode, moderate (HCC) 08/16/2013  . PTSD (post-traumatic stress disorder) 08/15/2013  . Oppositional defiant disorder 06/06/2012  . Suicidal ideation 02/12/2012  . Polysubstance abuse (HCC) 02/12/2012  . BACK PAIN 10/16/2010  . ASTHMA 06/23/2010  . GERD 06/23/2010    Past Surgical History:  Procedure Laterality Date  . MYRINGOTOMY    . TONSILLECTOMY AND ADENOIDECTOMY  as infant  . WRIST SURGERY  age 30    OB History   No obstetric history on file.      Home Medications    Prior to Admission medications   Medication Sig Start Date End Date Taking? Authorizing Provider  ARIPiprazole (ABILIFY) 5 MG tablet Take 1 tablet (5 mg total) by mouth at bedtime. 08/22/13   Winson, Louie Bun, NP  metoCLOPramide (REGLAN) 10 MG tablet Take 1 tablet (10 mg total) by mouth every 6 (six) hours as needed for nausea or vomiting. 07/31/19   Muthersbaugh, Dahlia Client, PA-C  mometasone-formoterol (DULERA) 100-5 MCG/ACT AERO Inhale 2 puffs into the lungs 2 (two) times daily.    [provider]  naproxen (NAPROSYN) 500 MG tablet Take 1  tablet (500 mg total) by mouth 2 (two) times daily. 03/04/20   Hall-Potvin, [place], PA-C  omeprazole (PRILOSEC) 20 MG capsule Take 1 capsule (20 mg total) by mouth daily. 07/31/19   Muthersbaugh, Dahlia Client, PA-C  ondansetron (ZOFRAN ODT) 4 MG disintegrating tablet Take 1 tablet (4 mg total) by mouth every 8 (eight) hours as needed for nausea or vomiting. 05/14/14   Renne Crigler, PA-C  sucralfate (CARAFATE) 1 g tablet Take 1 tablet (1 g total) by mouth 4 (four) times daily -  with meals and at bedtime. 07/31/19   Muthersbaugh, Dahlia Client, PA-C  venlafaxine (EFFEXOR) 75 MG tablet Take 1 tablet (75 mg total) by mouth daily with breakfast. 08/22/13   Winson, Louie Bun, NP  promethazine (PHENERGAN) 25 MG suppository Place 1 suppository (25 mg total) rectally every 6 (six) hours as needed for nausea or vomiting. 07/31/19 10/23/19  Muthersbaugh, Dahlia Client, PA-C    Family History Family History  Problem Relation Age of Onset  . Depression Mother   . Depression Maternal Grandmother   . Drug abuse Cousin   . Drug abuse Maternal Uncle     Social History Social History   Tobacco Use  . Smoking status: Current Every Day Smoker    Packs/day: 1.00    Years: 1.00    Pack years: 1.00    Types: Cigarettes  . Smokeless tobacco: Never Used  Substance Use Topics  . Alcohol use:  Yes    Comment: occasional  . Drug use: Not Currently    Types: Other-see comments, MDMA (Ecstacy)     Allergies   Lamotrigine   Review of Systems As per HPI   Physical Exam Triage Vital Signs ED Triage Vitals  Enc Vitals Group     BP 03/04/20 1440 122/79     Pulse Rate 03/04/20 1440 84     Resp 03/04/20 1440 16     Temp 03/04/20 1440 98.6 F (37 C)     Temp Source 03/04/20 1440 Oral     SpO2 03/04/20 1440 99 %     Weight --      Height --      Head Circumference --      Peak Flow --      Pain Score 03/04/20 1441 0     Pain Loc --      Pain Edu? --      Excl. in GC? --    No data found.  Updated Vital Signs BP  122/79 (BP Location: Left Arm)   Pulse 84   Temp 98.6 F (37 C) (Oral)   Resp 16   LMP 02/28/2020   SpO2 99%   Visual Acuity Right Eye Distance:   Left Eye Distance:   Bilateral Distance:    Right Eye Near:   Left Eye Near:    Bilateral Near:     Physical Exam Constitutional:      General: She is not in acute distress. HENT:     Head: Normocephalic and atraumatic.  Eyes:     General: No scleral icterus.    Pupils: Pupils are equal, round, and reactive to light.  Cardiovascular:     Rate and Rhythm: Normal rate.  Pulmonary:     Effort: Pulmonary effort is normal.  Abdominal:     General: Bowel sounds are normal.     Palpations: Abdomen is soft.     Tenderness: There is no abdominal tenderness. There is no right CVA tenderness, left CVA tenderness or guarding.  Genitourinary:    Comments: Right Bartholin cyst noted: Nontender. Skin:    Coloration: Skin is not jaundiced or pale.  Neurological:     Mental Status: She is alert and oriented to person, place, and time.      UC Treatments / Results  Labs (all labs ordered are listed, but only abnormal results are displayed) Labs Reviewed  HIV ANTIBODY (ROUTINE TESTING W REFLEX)  RPR  CERVICOVAGINAL ANCILLARY ONLY    EKG   Radiology No results found.  Procedures Procedures (including critical care time)  Medications Ordered in UC Medications - No data to display  Initial Impression / Assessment and Plan / UC Course  I have reviewed the triage vital signs and the nursing notes.  Pertinent labs & imaging results that were available during my care of the patient were reviewed by me and considered in my medical decision making (see chart for details).     Patient febrile, nontoxic in office today.  I&D not indicated as patient is without significant pain or swelling.  Reviewed supportive care as outlined below.  STI testing pending: We will treat if indicated.  Return precautions discussed, patient verbalized  understanding and is agreeable to plan. Final Clinical Impressions(s) / UC Diagnoses   Final diagnoses:  Cyst of right Bartholin's gland  Unprotected sex     Discharge Instructions     Naproxen 2 times a day with food. Sitz bath as discussed (  or hot compress/rag) 5 times a day for 10 min. Return for worsening swelling, pain, fever, discharge.    ED Prescriptions    Medication Sig Dispense Auth. Provider   naproxen (NAPROSYN) 500 MG tablet Take 1 tablet (500 mg total) by mouth 2 (two) times daily. 30 tablet Hall-Potvin, [place], PA-C     PDMP not reviewed this encounter.   Hall-Potvin, [place], [place] 03/04/20 1542

## 2020-03-04 NOTE — ED Triage Notes (Signed)
Pt presents to [facility] for assessment of a lump to her vaginal area 4-5 days ago.  Patient states she is sexually active.  Denies discharge, c/o mild pain.  Wants full STI testing, including blood work.

## 2020-03-05 ENCOUNTER — Ambulatory Visit
Admission: EM | Admit: 2020-03-05 | Discharge: 2020-03-05 | Disposition: A | Discharge status: Home / Self Care | Payer: Medicaid Other | Attending: Emergency Medicine | Admitting: Emergency Medicine

## 2020-03-05 DIAGNOSIS — A549 Gonococcal infection, unspecified: Secondary | ICD-10-CM

## 2020-03-05 LAB — CERVICOVAGINAL ANCILLARY ONLY
Chlamydia: NEGATIVE
Comment: NEGATIVE
Comment: NEGATIVE
Comment: NORMAL
Neisseria Gonorrhea: POSITIVE — AB
Trichomonas: POSITIVE — AB

## 2020-03-05 LAB — HIV ANTIBODY (ROUTINE TESTING W REFLEX): HIV Screen 4th Generation wRfx: NONREACTIVE

## 2020-03-05 LAB — RPR: RPR Ser Ql: NONREACTIVE

## 2020-03-05 MED ORDER — CEFTRIAXONE SODIUM 500 MG IJ SOLR
500.0000 mg | Freq: Once | INTRAMUSCULAR | Status: AC
  Administered 2020-03-05: 500 mg via INTRAMUSCULAR

## 2020-03-06 ENCOUNTER — Telehealth (HOSPITAL_COMMUNITY): Payer: Self-pay | Admitting: Orthopedic Surgery

## 2020-03-06 MED ORDER — METRONIDAZOLE 500 MG PO TABS: 500.0000 mg | ORAL_TABLET | Freq: Two times a day (BID) | ORAL | 0 refills | Status: DC

## 2020-03-10 IMAGING — DX DG ABDOMEN ACUTE W/ 1V CHEST
3 series · 3 of 3 positions shown · non-contrast
Comparison: None.

CLINICAL DATA: Abdominal pain with constipation and vomiting

EXAM:
DG ABDOMEN ACUTE W/ 1V CHEST

[chest pa]
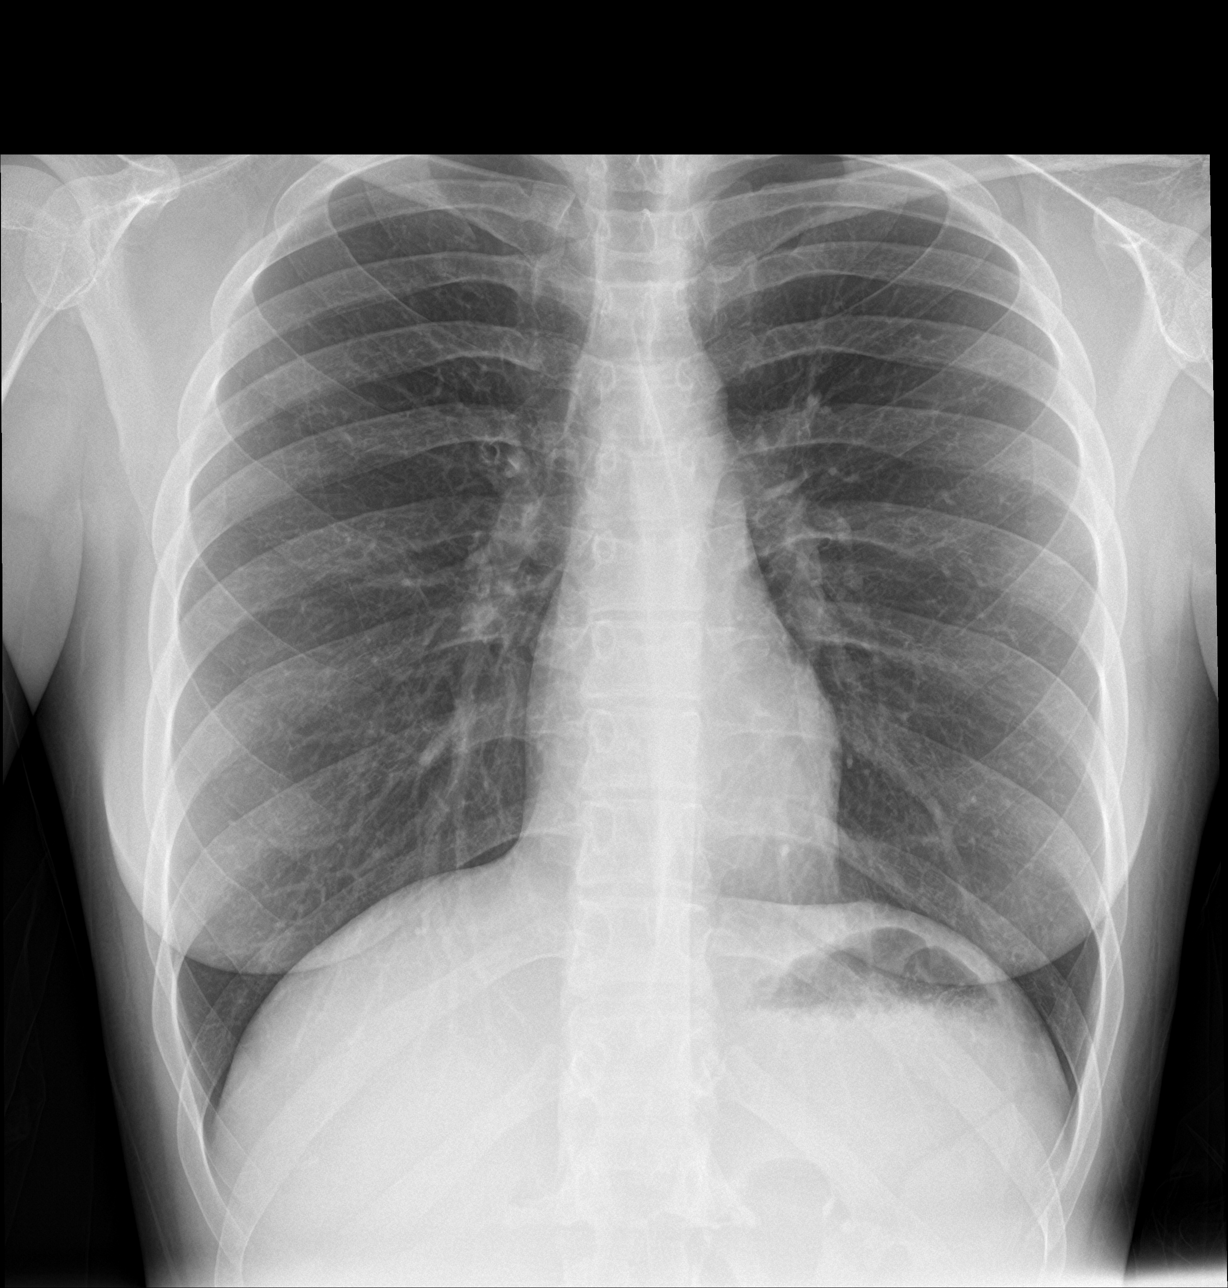

[abdomen erect]
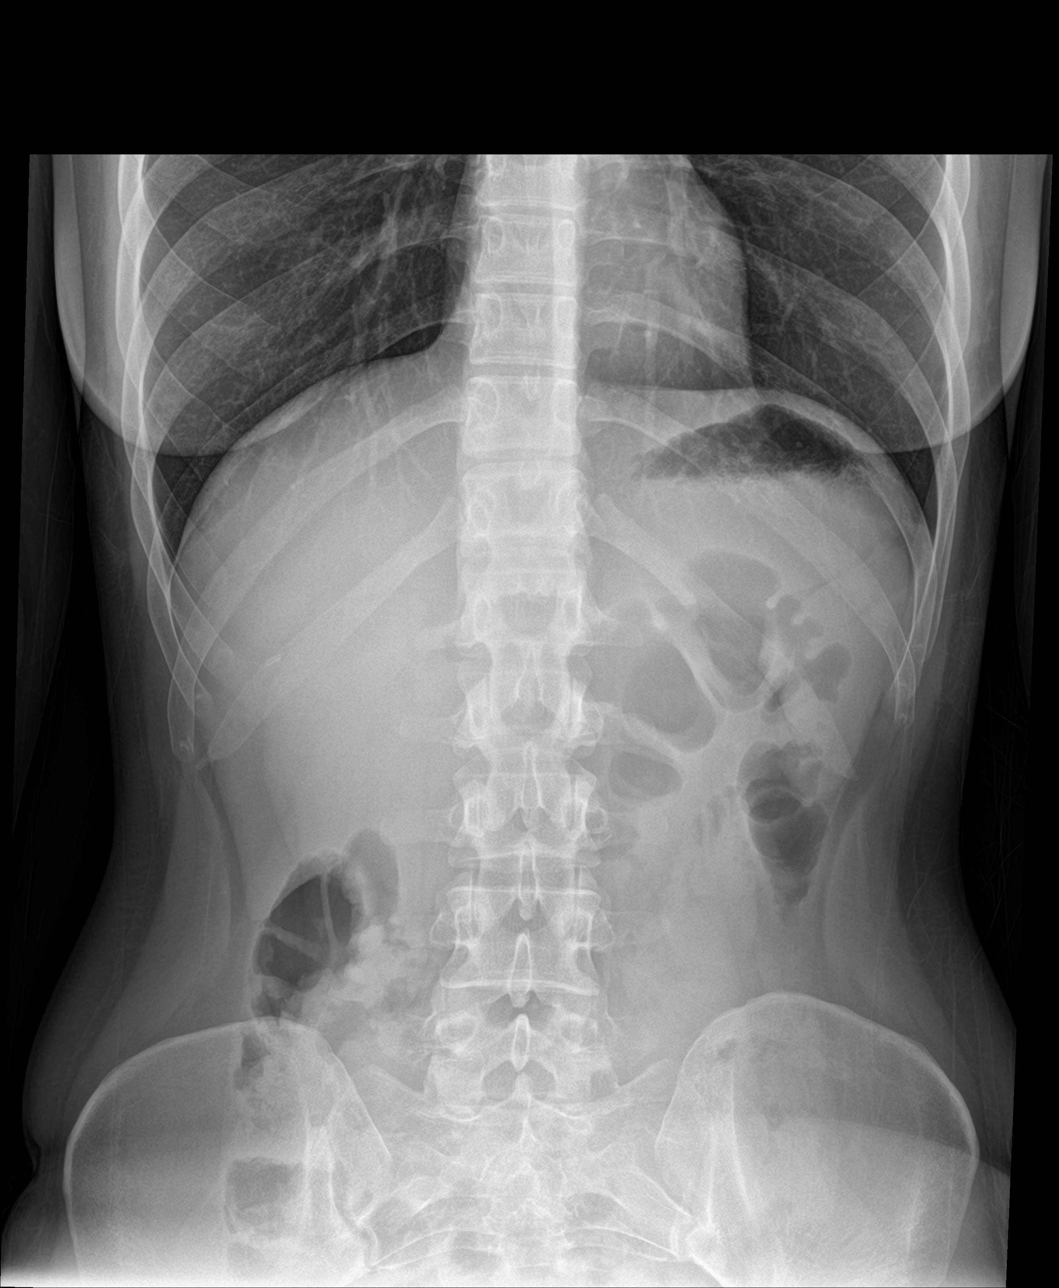

[abdomen supine]
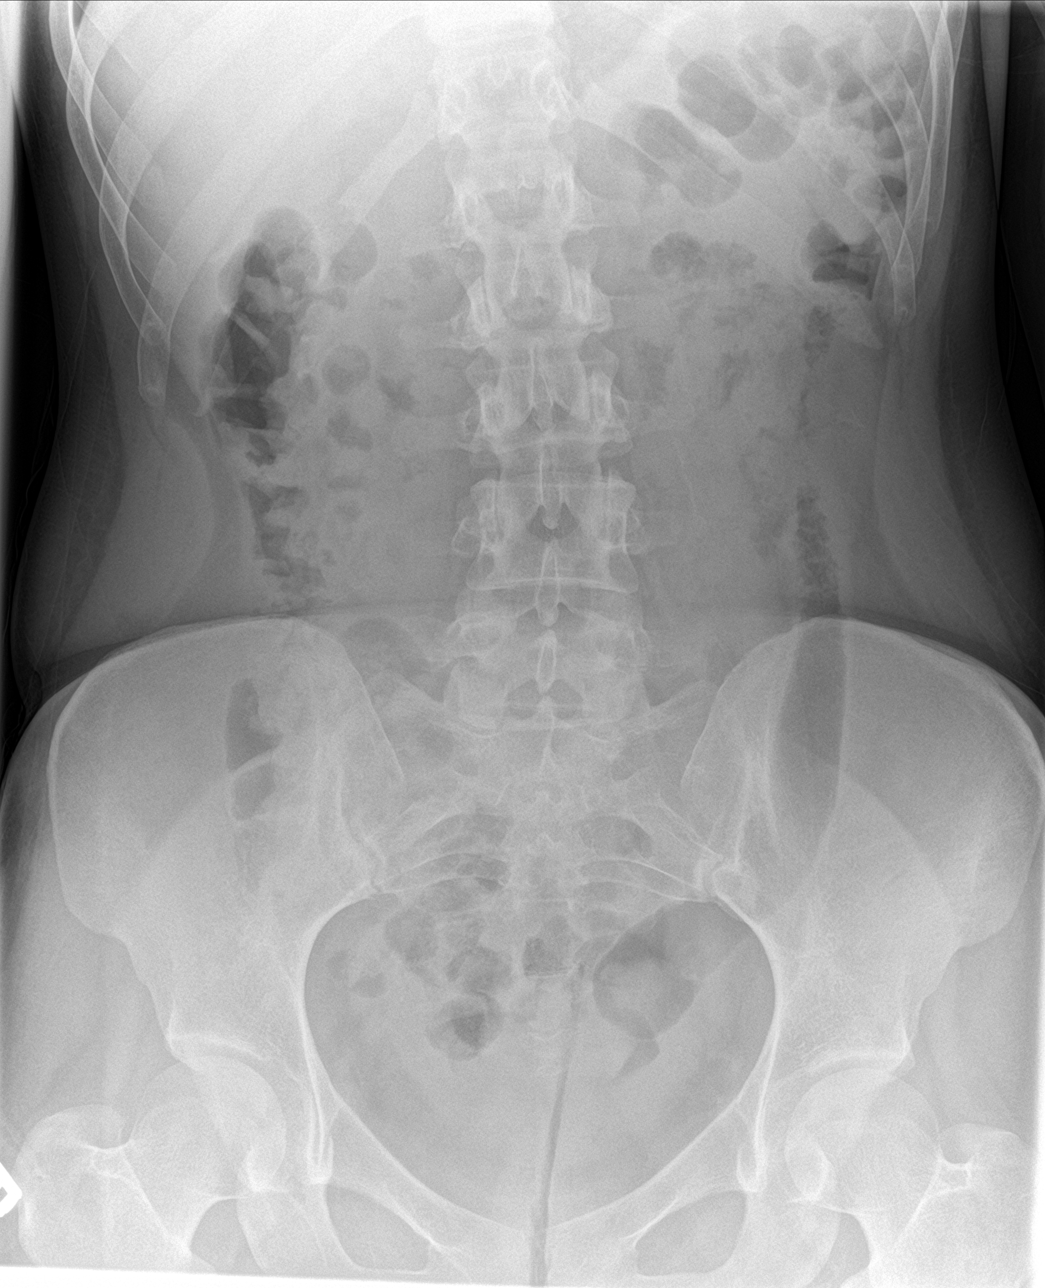

[3 of 3 positions shown; findings below may reference images not displayed]

FINDINGS: There is no evidence of dilated bowel loops or free intraperitoneal
air. No radiopaque calculi or other significant radiographic
abnormality is seen. Heart size and mediastinal contours are within
normal limits. Both lungs are clear.
IMPRESSION: Negative abdominal radiographs.  No acute cardiopulmonary disease.

## 2020-04-14 ENCOUNTER — Emergency Department (HOSPITAL_COMMUNITY)
Admission: EM | Admit: 2020-04-14 | Discharge: 2020-04-14 | Discharge status: Against Medical Advice | Payer: Medicaid Other

## 2020-04-14 NOTE — ED Notes (Signed)
Pt called for triage, no response. 

## 2020-04-14 NOTE — ED Notes (Signed)
Called patient, unable to locate at this time. 

## 2020-04-15 ENCOUNTER — Ambulatory Visit (HOSPITAL_COMMUNITY)
Admission: EM | Admit: 2020-04-15 | Discharge: 2020-04-15 | Disposition: A | Discharge status: Home / Self Care | Payer: Medicaid Other

## 2020-04-15 ENCOUNTER — Other Ambulatory Visit: Payer: Self-pay

## 2020-04-18 ENCOUNTER — Encounter (HOSPITAL_COMMUNITY): Payer: Self-pay

## 2020-04-18 ENCOUNTER — Ambulatory Visit (HOSPITAL_COMMUNITY)
Admission: RE | Admit: 2020-04-18 | Discharge: 2020-04-18 | Disposition: A | Discharge status: Home / Self Care | Payer: Self-pay | Source: Ambulatory Visit | Attending: Family Medicine | Admitting: Family Medicine

## 2020-04-18 ENCOUNTER — Other Ambulatory Visit: Payer: Self-pay

## 2020-04-18 VITALS — BP 116/71 | HR 99 | Temp 99.0°F | Resp 18

## 2020-04-18 DIAGNOSIS — Z3202 Encounter for pregnancy test, result negative: Secondary | ICD-10-CM

## 2020-04-18 DIAGNOSIS — Z1152 Encounter for screening for COVID-19: Secondary | ICD-10-CM

## 2020-04-18 DIAGNOSIS — R0981 Nasal congestion: Secondary | ICD-10-CM

## 2020-04-18 DIAGNOSIS — J069 Acute upper respiratory infection, unspecified: Secondary | ICD-10-CM | POA: Insufficient documentation

## 2020-04-18 DIAGNOSIS — R300 Dysuria: Secondary | ICD-10-CM | POA: Insufficient documentation

## 2020-04-18 DIAGNOSIS — Z20822 Contact with and (suspected) exposure to covid-19: Secondary | ICD-10-CM | POA: Insufficient documentation

## 2020-04-18 LAB — POCT URINALYSIS DIPSTICK, ED / UC
Glucose, UA: NEGATIVE mg/dL
Ketones, ur: NEGATIVE mg/dL
Nitrite: NEGATIVE
Protein, ur: 30 mg/dL — AB
Specific Gravity, Urine: 1.02 (ref 1.005–1.030)
Urobilinogen, UA: 1 mg/dL (ref 0.0–1.0)
pH: 7.5 (ref 5.0–8.0)

## 2020-04-18 LAB — POC URINE PREG, ED: Preg Test, Ur: NEGATIVE

## 2020-04-18 LAB — SARS CORONAVIRUS 2 (TAT 6-24 HRS): SARS Coronavirus 2: NEGATIVE

## 2020-04-18 MED ORDER — CEPHALEXIN 500 MG PO CAPS: 500.0000 mg | ORAL_CAPSULE | Freq: Two times a day (BID) | ORAL | 0 refills | Status: AC

## 2020-04-18 NOTE — ED Triage Notes (Signed)
Pt present with runny nose, sore throat, productive yellow cough, has not taken temp, but states has been "hot and cold on and off" xs 2-3 days. States has been around co-worker that was sick.  Denies chest pain, loss taste or smell, N, V, D. States has taken mucinex, with no relief.   Pt presents with dysuria, blood in urine, abdominal cramping, cyst on outside of vagina with soreness. xs 1 week.  Denies taken OTC medications. States would like to be tested for UTI and STI.

## 2020-04-18 NOTE — ED Provider Notes (Signed)
[facility]    CSN: [id number] Arrival date & time: 04/18/20  0847      History   Chief Complaint Chief Complaint  Patient presents with  . Nasal Congestion  . Dysuria    HPI Tracy Guzman is a 24 y.o. female.   Pt is a 24 year old female that presents with multiple complaints.  First complaint being nasal congestion, rhinorrhea, sore throat, productive cough, hot cold chills.  This is been for 2 to 3 days.  Around a coworker with similar symptoms recently.  Denies any chest pain, loss of taste or smell, nausea, vomiting, diarrhea.  Taking Mucinex  She is also having dysuria, hematuria, abdominal cramping.  She would like to be checked for STDs.  Also feels like she may have a urinary tract infection.  ROS per HPI      Past Medical History:  Diagnosis Date  . Allergy    Smoke, dust, Pollen  . Asthma   . Depression   . Endometriosis   . GERD (gastroesophageal reflux disease)     Patient Active Problem List   Diagnosis Date Noted  . MDD (major depressive disorder), recurrent episode, moderate (HCC) 08/16/2013  . PTSD (post-traumatic stress disorder) 08/15/2013  . Oppositional defiant disorder 06/06/2012  . Suicidal ideation 02/12/2012  . Polysubstance abuse (HCC) 02/12/2012  . BACK PAIN 10/16/2010  . ASTHMA 06/23/2010  . GERD 06/23/2010    Past Surgical History:  Procedure Laterality Date  . MYRINGOTOMY    . TONSILLECTOMY AND ADENOIDECTOMY  as infant  . WRIST SURGERY  age 68    OB History   No obstetric history on file.      Home Medications    Prior to Admission medications   Medication Sig Start Date End Date Taking? Authorizing Provider  ARIPiprazole (ABILIFY) 5 MG tablet Take 1 tablet (5 mg total) by mouth at bedtime. Patient not taking: Reported on 04/18/2020 08/22/13   [name] B, NP  cephALEXin (KEFLEX) 500 MG capsule Take 1 capsule (500 mg total) by mouth 2 (two) times daily for 5 days. 04/18/20 04/23/20  Dahlia Byes A, NP    metoCLOPramide (REGLAN) 10 MG tablet Take 1 tablet (10 mg total) by mouth every 6 (six) hours as needed for nausea or vomiting. 07/31/19   Muthersbaugh, Dahlia Client, PA-C  naproxen (NAPROSYN) 500 MG tablet Take 1 tablet (500 mg total) by mouth 2 (two) times daily. 03/04/20   Hall-Potvin, [place], PA-C  omeprazole (PRILOSEC) 20 MG capsule Take 1 capsule (20 mg total) by mouth daily. 07/31/19   Muthersbaugh, Dahlia Client, PA-C  ondansetron (ZOFRAN ODT) 4 MG disintegrating tablet Take 1 tablet (4 mg total) by mouth every 8 (eight) hours as needed for nausea or vomiting. 05/14/14   Renne Crigler, PA-C  sucralfate (CARAFATE) 1 g tablet Take 1 tablet (1 g total) by mouth 4 (four) times daily -  with meals and at bedtime. 07/31/19   Muthersbaugh, Dahlia Client, PA-C  venlafaxine (EFFEXOR) 75 MG tablet Take 1 tablet (75 mg total) by mouth daily with breakfast. Patient not taking: Reported on 04/18/2020 08/22/13   Jolene Schimke, NP  mometasone-formoterol (DULERA) 100-5 MCG/ACT AERO Inhale 2 puffs into the lungs 2 (two) times daily.  04/18/20  [provider]  promethazine (PHENERGAN) 25 MG suppository Place 1 suppository (25 mg total) rectally every 6 (six) hours as needed for nausea or vomiting. 07/31/19 10/23/19  Muthersbaugh, Dahlia Client, PA-C    Family History Family History  Problem Relation Age of Onset  .  Depression Mother   . Hyperlipidemia Mother   . Hypertension Mother   . Depression Maternal Grandmother   . Drug abuse Cousin   . Drug abuse Maternal Uncle   . Hypertension Father   . Hyperlipidemia Father     Social History Social History   Tobacco Use  . Smoking status: Current Every Day Smoker    Packs/day: 1.00    Years: 1.00    Pack years: 1.00    Types: Cigarettes  . Smokeless tobacco: Never Used  Substance Use Topics  . Alcohol use: Yes    Comment: occasional  . Drug use: Not Currently    Types: Other-see comments, MDMA (Ecstacy)     Allergies   Lamotrigine   Review of  Systems Review of Systems   Physical Exam Triage Vital Signs ED Triage Vitals  Enc Vitals Group     BP 04/18/20 0915 116/71     Pulse Rate 04/18/20 0915 99     Resp 04/18/20 0915 18     Temp 04/18/20 0915 99 F (37.2 C)     Temp Source 04/18/20 0915 Oral     SpO2 04/18/20 0915 100 %     Weight --      Height --      Head Circumference --      Peak Flow --      Pain Score 04/18/20 0911 7     Pain Loc --      Pain Edu? --      Excl. in GC? --    No data found.  Updated Vital Signs BP 116/71 (BP Location: Right Arm)   Pulse 99   Temp 99 F (37.2 C) (Oral)   Resp 18   LMP 03/16/2020 (Approximate)   SpO2 100%   Visual Acuity Right Eye Distance:   Left Eye Distance:   Bilateral Distance:    Right Eye Near:   Left Eye Near:    Bilateral Near:     Physical Exam Constitutional:      General: She is not in acute distress.    Appearance: She is not toxic-appearing.  HENT:     Nose: Congestion and rhinorrhea present.     Mouth/Throat:     Pharynx: Oropharynx is clear.  Cardiovascular:     Rate and Rhythm: Normal rate and regular rhythm.     Pulses: Normal pulses.     Heart sounds: Normal heart sounds.  Pulmonary:     Effort: Pulmonary effort is normal.     Breath sounds: Normal breath sounds.  Musculoskeletal:        General: Normal range of motion.  Skin:    General: Skin is warm and dry.  Neurological:     Mental Status: She is alert.  Psychiatric:        Mood and Affect: Mood normal.      UC Treatments / Results  Labs (all labs ordered are listed, but only abnormal results are displayed) Labs Reviewed  POCT URINALYSIS DIPSTICK, ED / UC - Abnormal; Notable for the following components:      Result Value   Bilirubin Urine SMALL (*)    Hgb urine dipstick MODERATE (*)    Protein, ur 30 (*)    Leukocytes,Ua SMALL (*)    All other components within normal limits  SARS CORONAVIRUS 2 (TAT 6-24 HRS)  URINE CULTURE  POC URINE PREG, ED  CERVICOVAGINAL  ANCILLARY ONLY    EKG   Radiology No results found.  Procedures Procedures (including critical care time)  Medications Ordered in UC Medications - No data to display  Initial Impression / Assessment and Plan / UC Course  I have reviewed the triage vital signs and the nursing notes.  Pertinent labs & imaging results that were available during my care of the patient were reviewed by me and considered in my medical decision making (see chart for details).     Viral URI with cough Covid swab pending. Over-the-counter medicines as needed for symptoms. Hydrate, rest.  Dysuria urine with small leuks and moderate hemoglobin Sending for culture.  We will go ahead and treat for urinary tract infection based on symptoms and urinalysis. Also sending swab for STD testing.  Final Clinical Impressions(s) / UC Diagnoses   Final diagnoses:  Encounter for screening for COVID-19  Dysuria  Viral URI with cough     Discharge Instructions     Continue the Mucinex as needed for upper respiratory symptoms. Make sure you are staying hydrated and drinking plenty of water You do have some bacteria and blood in your urine.  I am sending for culture.  We will can go ahead and treat in the meantime for urinary tract infection Also sending a swab for testing for STDs bacteria and yeast Covid swab pending we will call you if your Covid swab is positive.  You can check your MyChart for all of these results.     ED Prescriptions    Medication Sig Dispense Auth. Provider   cephALEXin (KEFLEX) 500 MG capsule Take 1 capsule (500 mg total) by mouth 2 (two) times daily for 5 days. 10 capsule Dahlia Byes A, NP     PDMP not reviewed this encounter.   Janace Aris, NP 04/19/20 [phone]

## 2020-04-18 NOTE — Discharge Instructions (Addendum)
Continue the Mucinex as needed for upper respiratory symptoms. Make sure you are staying hydrated and drinking plenty of water You do have some bacteria and blood in your urine.  I am sending for culture.  We will can go ahead and treat in the meantime for urinary tract infection Also sending a swab for testing for STDs bacteria and yeast Covid swab pending we will call you if your Covid swab is positive.  You can check your MyChart for all of these results.

## 2020-04-19 ENCOUNTER — Telehealth (HOSPITAL_COMMUNITY): Payer: Self-pay

## 2020-04-19 LAB — CERVICOVAGINAL ANCILLARY ONLY
Bacterial Vaginitis (gardnerella): POSITIVE — AB
Candida Glabrata: NEGATIVE
Candida Vaginitis: NEGATIVE
Chlamydia: NEGATIVE
Comment: NEGATIVE
Comment: NEGATIVE
Comment: NEGATIVE
Comment: NEGATIVE
Comment: NEGATIVE
Comment: NORMAL
Neisseria Gonorrhea: POSITIVE — AB
Trichomonas: POSITIVE — AB

## 2020-04-19 LAB — URINE CULTURE: Culture: <10000 — AB

## 2020-04-19 MED ORDER — METRONIDAZOLE 500 MG PO TABS: 500.0000 mg | ORAL_TABLET | Freq: Two times a day (BID) | ORAL | 0 refills | Status: DC

## 2020-04-20 ENCOUNTER — Encounter (HOSPITAL_COMMUNITY): Payer: Self-pay

## 2020-04-20 ENCOUNTER — Other Ambulatory Visit: Payer: Self-pay

## 2020-04-20 ENCOUNTER — Ambulatory Visit (HOSPITAL_COMMUNITY)
Admission: EM | Admit: 2020-04-20 | Discharge: 2020-04-20 | Disposition: A | Discharge status: Home / Self Care | Payer: Medicaid Other | Attending: Physician Assistant | Admitting: Physician Assistant

## 2020-04-20 DIAGNOSIS — N750 Cyst of Bartholin's gland: Secondary | ICD-10-CM

## 2020-04-20 DIAGNOSIS — B9689 Other specified bacterial agents as the cause of diseases classified elsewhere: Secondary | ICD-10-CM

## 2020-04-20 DIAGNOSIS — A599 Trichomoniasis, unspecified: Secondary | ICD-10-CM

## 2020-04-20 DIAGNOSIS — A549 Gonococcal infection, unspecified: Secondary | ICD-10-CM

## 2020-04-20 MED ORDER — LIDOCAINE-EPINEPHRINE 1 %-1:100000 IJ SOLN
INTRAMUSCULAR | Status: AC
  Filled 2020-04-20: qty 1

## 2020-04-20 MED ORDER — DOXYCYCLINE HYCLATE 100 MG PO CAPS: 100.0000 mg | ORAL_CAPSULE | Freq: Two times a day (BID) | ORAL | 0 refills | Status: DC

## 2020-04-20 MED ORDER — METRONIDAZOLE 500 MG PO TABS: 500.0000 mg | ORAL_TABLET | Freq: Two times a day (BID) | ORAL | 0 refills | Status: DC

## 2020-04-20 MED ORDER — CEFTRIAXONE SODIUM 500 MG IJ SOLR
500.0000 mg | Freq: Once | INTRAMUSCULAR | Status: AC
  Administered 2020-04-20: 500 mg via INTRAMUSCULAR

## 2020-04-20 MED ORDER — CEFTRIAXONE SODIUM 500 MG IJ SOLR
INTRAMUSCULAR | Status: AC
  Filled 2020-04-20: qty 500

## 2020-04-20 NOTE — ED Provider Notes (Signed)
[facility]    CSN: [id number] Arrival date & time: 04/20/20  1707      History   Chief Complaint Chief Complaint  Patient presents with  . Bartholin Cyst  . STD Treatment    HPI Tracy Guzman is a 24 y.o. female.   Patient presents for STD treatment for gonorrhea, trichomonas as well as bacterial vaginosis.  She also states she has a Bartholin cyst on her right side.  She reports she had 1 of these in June.  This is worse and larger.  She reports it became quite painful yesterday.  Is been accumulating for a few days to a week.  Denies drainage.  Patient states she does not have an OB/GYN.     Past Medical History:  Diagnosis Date  . Allergy    Smoke, dust, Pollen  . Asthma   . Depression   . Endometriosis   . GERD (gastroesophageal reflux disease)     Patient Active Problem List   Diagnosis Date Noted  . MDD (major depressive disorder), recurrent episode, moderate (HCC) 08/16/2013  . PTSD (post-traumatic stress disorder) 08/15/2013  . Oppositional defiant disorder 06/06/2012  . Suicidal ideation 02/12/2012  . Polysubstance abuse (HCC) 02/12/2012  . BACK PAIN 10/16/2010  . ASTHMA 06/23/2010  . GERD 06/23/2010    Past Surgical History:  Procedure Laterality Date  . MYRINGOTOMY    . TONSILLECTOMY AND ADENOIDECTOMY  as infant  . WRIST SURGERY  age 28    OB History   No obstetric history on file.      Home Medications    Prior to Admission medications   Medication Sig Start Date End Date Taking? Authorizing Provider  ARIPiprazole (ABILIFY) 5 MG tablet Take 1 tablet (5 mg total) by mouth at bedtime. Patient not taking: Reported on 04/18/2020 08/22/13   [name] B, NP  cephALEXin (KEFLEX) 500 MG capsule Take 1 capsule (500 mg total) by mouth 2 (two) times daily for 5 days. 04/18/20 04/23/20  Dahlia Byes A, NP  doxycycline (VIBRAMYCIN) 100 MG capsule Take 1 capsule (100 mg total) by mouth 2 (two) times daily. 04/20/20   Yama Nielson, Veryl Speak, PA-C    metoCLOPramide (REGLAN) 10 MG tablet Take 1 tablet (10 mg total) by mouth every 6 (six) hours as needed for nausea or vomiting. 07/31/19   Muthersbaugh, Dahlia Client, PA-C  metroNIDAZOLE (FLAGYL) 500 MG tablet Take 1 tablet (500 mg total) by mouth 2 (two) times daily. 04/20/20   Shontae Rosiles, Veryl Speak, PA-C  naproxen (NAPROSYN) 500 MG tablet Take 1 tablet (500 mg total) by mouth 2 (two) times daily. 03/04/20   Hall-Potvin, [place], PA-C  omeprazole (PRILOSEC) 20 MG capsule Take 1 capsule (20 mg total) by mouth daily. 07/31/19   Muthersbaugh, Dahlia Client, PA-C  ondansetron (ZOFRAN ODT) 4 MG disintegrating tablet Take 1 tablet (4 mg total) by mouth every 8 (eight) hours as needed for nausea or vomiting. 05/14/14   Renne Crigler, PA-C  sucralfate (CARAFATE) 1 g tablet Take 1 tablet (1 g total) by mouth 4 (four) times daily -  with meals and at bedtime. 07/31/19   Muthersbaugh, Dahlia Client, PA-C  venlafaxine (EFFEXOR) 75 MG tablet Take 1 tablet (75 mg total) by mouth daily with breakfast. Patient not taking: Reported on 04/18/2020 08/22/13   Jolene Schimke, NP  mometasone-formoterol (DULERA) 100-5 MCG/ACT AERO Inhale 2 puffs into the lungs 2 (two) times daily.  04/18/20  [provider]  promethazine (PHENERGAN) 25 MG suppository Place 1 suppository (25 mg  total) rectally every 6 (six) hours as needed for nausea or vomiting. 07/31/19 10/23/19  Muthersbaugh, Dahlia Client, PA-C    Family History Family History  Problem Relation Age of Onset  . Depression Mother   . Hyperlipidemia Mother   . Hypertension Mother   . Depression Maternal Grandmother   . Drug abuse Cousin   . Drug abuse Maternal Uncle   . Hypertension Father   . Hyperlipidemia Father     Social History Social History   Tobacco Use  . Smoking status: Current Every Day Smoker    Packs/day: 1.00    Years: 1.00    Pack years: 1.00    Types: Cigarettes  . Smokeless tobacco: Never Used  Substance Use Topics  . Alcohol use: Yes    Comment: occasional  .  Drug use: Not Currently    Types: Other-see comments, MDMA (Ecstacy)     Allergies   Lamotrigine   Review of Systems Review of Systems   Physical Exam Triage Vital Signs ED Triage Vitals  Enc Vitals Group     BP 04/20/20 1807 117/75     Pulse Rate 04/20/20 1807 66     Resp 04/20/20 1807 18     Temp 04/20/20 1807 99.2 F (37.3 C)     Temp Source 04/20/20 1807 Oral     SpO2 04/20/20 1807 98 %     Weight --      Height --      Head Circumference --      Peak Flow --      Pain Score 04/20/20 1805 10     Pain Loc --      Pain Edu? --      Excl. in GC? --    No data found.  Updated Vital Signs BP 117/75 (BP Location: Left Arm)   Pulse 66   Temp 99.2 F (37.3 C) (Oral)   Resp 18   LMP 04/17/2020   SpO2 98%   Visual Acuity Right Eye Distance:   Left Eye Distance:   Bilateral Distance:    Right Eye Near:   Left Eye Near:    Bilateral Near:     Physical Exam Vitals and nursing note reviewed.  Constitutional:      Appearance: Normal appearance.  Genitourinary:      Comments: Large swollen and infected right sided Bartholin cyst.  Active vaginal bleeding from menses. Neurological:     Mental Status: She is alert.      UC Treatments / Results  Labs (all labs ordered are listed, but only abnormal results are displayed) Labs Reviewed - No data to display  EKG   Radiology No results found.  Procedures Incision and Drainage  Date/Time: 04/20/2020 6:51 PM Performed by: Hermelinda Medicus, PA-C Authorized by: Hermelinda Medicus, PA-C   Consent:    Consent obtained:  Verbal   Consent given by:  Patient   Risks discussed:  Pain   Alternatives discussed:  Referral Location:    Type:  Bartholin cyst   Location:  Anogenital   Anogenital location:  Bartholin's gland (Right side) Pre-procedure details:    Skin preparation:  Betadine Anesthesia (see MAR for exact dosages):    Anesthesia method:  Local infiltration   Local anesthetic:  Lidocaine 1% WITH  epi Procedure type:    Complexity:  Simple Procedure details:    Incision types:  Single straight   Scalpel blade:  11   Drainage amount:  Copious   Wound treatment:  Wound left open   Packing materials:  None Post-procedure details:    Patient tolerance of procedure:  Tolerated well, no immediate complications   (including critical care time)  Medications Ordered in UC Medications  cefTRIAXone (ROCEPHIN) injection 500 mg (has no administration in time range)    Initial Impression / Assessment and Plan / UC Course  I have reviewed the triage vital signs and the nursing notes.  Pertinent labs & imaging results that were available during my care of the patient were reviewed by me and considered in my medical decision making (see chart for details).     Stag infected Bartholin's gland duct #Gonorrhea #Trichomonas #BV Patient is a 24 year old presenting with trichomonas, gonorrhea, BV as well as an infected right-sided Bartholin gland.  Lance drainage performed.  Patient treated with Rocephin in clinic, metronidazole and doxycycline both sent.  Instructed patient that she needs to follow-up with OB/GYN and instructed her to call Monday morning and take soonest available appointment.  We discussed that this will likely symptomatically resolve the current Bartholin's gland cyst infection however these do return if not monitored closely.  She verbalized understanding. Final Clinical Impressions(s) / UC Diagnoses   Final diagnoses:  Infected cyst of Bartholin's gland duct  Gonorrhea  Trichomonas infection  BV (bacterial vaginosis)     Discharge Instructions     Wear pads   Do warm bath soaks  Take the medications as prescribed with food and plenty of water  Call the Ob/gyn Monday  Some drainage and bleeding is expected for the next 24 hours  Abstain from sex until all treatments are complete      ED Prescriptions    Medication Sig Dispense Auth. Provider    metroNIDAZOLE (FLAGYL) 500 MG tablet Take 1 tablet (500 mg total) by mouth 2 (two) times daily. 14 tablet Caylor Tallarico, Veryl Speak, PA-C   doxycycline (VIBRAMYCIN) 100 MG capsule Take 1 capsule (100 mg total) by mouth 2 (two) times daily. 20 capsule Emaly Boschert, Veryl Speak, PA-C     PDMP not reviewed this encounter.   Hermelinda Medicus, PA-C 04/20/20 [phone]

## 2020-04-20 NOTE — ED Triage Notes (Signed)
Pt presents for recurrent bartholin cyst X 7 days.  Pt also presents for STD treatment after getting a positive cevical swab yesterday.

## 2020-04-20 NOTE — Discharge Instructions (Signed)
Wear pads   Do warm bath soaks  Take the medications as prescribed with food and plenty of water  Call the Ob/gyn Monday  Some drainage and bleeding is expected for the next 24 hours  Abstain from sex until all treatments are complete

## 2020-04-26 MED ORDER — ONDANSETRON HCL 4 MG PO TABS: 4.0000 mg | ORAL_TABLET | Freq: Four times a day (QID) | ORAL | 0 refills | Status: DC

## 2020-04-26 NOTE — Telephone Encounter (Signed)
Patient left a voicemail stating she is having excessive vomiting from her antibiotics.  Discussed with Dr. Delton See who gave verbal for Zofran as well as follow-up precautions.  Reviewed with patient, patient verbalized understanding to return to see [place] or her PCP if the vomiting does not resolve with anti-emetic.

## 2020-06-16 ENCOUNTER — Other Ambulatory Visit: Payer: Self-pay

## 2020-06-16 ENCOUNTER — Ambulatory Visit (HOSPITAL_COMMUNITY)
Admission: EM | Admit: 2020-06-16 | Discharge: 2020-06-16 | Disposition: A | Discharge status: Home / Self Care | Payer: Medicaid Other | Attending: Emergency Medicine | Admitting: Emergency Medicine

## 2020-06-16 ENCOUNTER — Encounter (HOSPITAL_COMMUNITY): Payer: Self-pay

## 2020-06-16 DIAGNOSIS — N898 Other specified noninflammatory disorders of vagina: Secondary | ICD-10-CM

## 2020-06-16 DIAGNOSIS — Z202 Contact with and (suspected) exposure to infections with a predominantly sexual mode of transmission: Secondary | ICD-10-CM

## 2020-06-16 LAB — POC URINE PREG, ED: Preg Test, Ur: NEGATIVE

## 2020-06-16 LAB — POCT URINALYSIS DIPSTICK, ED / UC
Bilirubin Urine: NEGATIVE
Glucose, UA: NEGATIVE mg/dL
Hgb urine dipstick: NEGATIVE
Ketones, ur: NEGATIVE mg/dL
Nitrite: NEGATIVE
Protein, ur: NEGATIVE mg/dL
Specific Gravity, Urine: 1.02 (ref 1.005–1.030)
Urobilinogen, UA: 1 mg/dL (ref 0.0–1.0)
pH: 8.5 — ABNORMAL HIGH (ref 5.0–8.0)

## 2020-06-16 MED ORDER — METRONIDAZOLE 500 MG PO TABS: 500.0000 mg | ORAL_TABLET | Freq: Two times a day (BID) | ORAL | 0 refills | Status: DC

## 2020-06-16 MED ORDER — FLUCONAZOLE 150 MG PO TABS: 150.0000 mg | ORAL_TABLET | Freq: Every day | ORAL | 0 refills | Status: DC

## 2020-06-16 MED ORDER — CEFTRIAXONE SODIUM 500 MG IJ SOLR
500.0000 mg | Freq: Once | INTRAMUSCULAR | Status: AC
  Administered 2020-06-16: 500 mg via INTRAMUSCULAR

## 2020-06-16 MED ORDER — LIDOCAINE HCL (PF) 1 % IJ SOLN
INTRAMUSCULAR | Status: AC
  Filled 2020-06-16: qty 2

## 2020-06-16 MED ORDER — CEFTRIAXONE SODIUM 500 MG IJ SOLR
INTRAMUSCULAR | Status: AC
  Filled 2020-06-16: qty 500

## 2020-06-16 MED ORDER — DOXYCYCLINE HYCLATE 100 MG PO CAPS: 100.0000 mg | ORAL_CAPSULE | Freq: Two times a day (BID) | ORAL | 0 refills | Status: AC

## 2020-06-16 NOTE — ED Provider Notes (Signed)
[facility]    CSN: [id number] Arrival date & time: 06/16/20  1207      History   Chief Complaint Chief Complaint  Patient presents with   Vaginal Discharge    HPI Tracy Guzman is a 24 y.o. female.   Patient presents with white vaginal discharge, vaginal itching, vaginal irritation x3 days.  No treatments attempted at home.  She denies fever, chills, abdominal pain, dysuria, pelvic pain, back pain, lesions, or other symptoms.  Patient had trichomonas and gonorrhea on 03/04/2020.  Her medical history includes depression, PTSD, oppositional defiant disorder, polysubstance abuse, suicidal ideation, back pain, asthma, endometriosis, GERD.  The history is provided by the patient.    Past Medical History:  Diagnosis Date   Allergy    Smoke, dust, Pollen   Asthma    Depression    Endometriosis    GERD (gastroesophageal reflux disease)     Patient Active Problem List   Diagnosis Date Noted   MDD (major depressive disorder), recurrent episode, moderate (HCC) 08/16/2013   PTSD (post-traumatic stress disorder) 08/15/2013   Oppositional defiant disorder 06/06/2012   Suicidal ideation 02/12/2012   Polysubstance abuse (HCC) 02/12/2012   BACK PAIN 10/16/2010   ASTHMA 06/23/2010   GERD 06/23/2010    Past Surgical History:  Procedure Laterality Date   MYRINGOTOMY     TONSILLECTOMY AND ADENOIDECTOMY  as infant   WRIST SURGERY  age 17    OB History   No obstetric history on file.      Home Medications    Prior to Admission medications   Medication Sig Start Date End Date Taking? Authorizing Provider  ARIPiprazole (ABILIFY) 5 MG tablet Take 1 tablet (5 mg total) by mouth at bedtime. Patient not taking: Reported on 04/18/2020 08/22/13   [name] B, NP  doxycycline (VIBRAMYCIN) 100 MG capsule Take 1 capsule (100 mg total) by mouth 2 (two) times daily for 7 days. 06/16/20 06/23/20  Mickie Bail, NP  fluconazole (DIFLUCAN) 150 MG tablet  Take 1 tablet (150 mg total) by mouth daily. Take one tablet today.  May repeat in 3 days. 06/16/20   Mickie Bail, NP  metoCLOPramide (REGLAN) 10 MG tablet Take 1 tablet (10 mg total) by mouth every 6 (six) hours as needed for nausea or vomiting. 07/31/19   Muthersbaugh, Dahlia Client, PA-C  metroNIDAZOLE (FLAGYL) 500 MG tablet Take 1 tablet (500 mg total) by mouth 2 (two) times daily. 06/16/20   Mickie Bail, NP  naproxen (NAPROSYN) 500 MG tablet Take 1 tablet (500 mg total) by mouth 2 (two) times daily. 03/04/20   Hall-Potvin, [place], PA-C  omeprazole (PRILOSEC) 20 MG capsule Take 1 capsule (20 mg total) by mouth daily. 07/31/19   Muthersbaugh, Dahlia Client, PA-C  ondansetron (ZOFRAN ODT) 4 MG disintegrating tablet Take 1 tablet (4 mg total) by mouth every 8 (eight) hours as needed for nausea or vomiting. 05/14/14   Renne Crigler, PA-C  ondansetron (ZOFRAN) 4 MG tablet Take 1-2 tablets (4-8 mg total) by mouth every 6 (six) hours. 04/26/20   Eustace Moore, MD  sucralfate (CARAFATE) 1 g tablet Take 1 tablet (1 g total) by mouth 4 (four) times daily -  with meals and at bedtime. 07/31/19   Muthersbaugh, Dahlia Client, PA-C  venlafaxine (EFFEXOR) 75 MG tablet Take 1 tablet (75 mg total) by mouth daily with breakfast. Patient not taking: Reported on 04/18/2020 08/22/13   Jolene Schimke, NP  mometasone-formoterol (DULERA) 100-5 MCG/ACT AERO Inhale 2 puffs into the  lungs 2 (two) times daily.  04/18/20  [provider]  promethazine (PHENERGAN) 25 MG suppository Place 1 suppository (25 mg total) rectally every 6 (six) hours as needed for nausea or vomiting. 07/31/19 10/23/19  Muthersbaugh, Dahlia Client, PA-C    Family History Family History  Problem Relation Age of Onset   Depression Mother    Hyperlipidemia Mother    Hypertension Mother    Depression Maternal Grandmother    Drug abuse Cousin    Drug abuse Maternal Uncle    Hypertension Father    Hyperlipidemia Father     Social History Social  History   Tobacco Use   Smoking status: Current Every Day Smoker    Packs/day: 1.00    Years: 1.00    Pack years: 1.00    Types: Cigarettes   Smokeless tobacco: Never Used  Substance Use Topics   Alcohol use: Yes    Comment: occasional   Drug use: Not Currently    Types: Other-see comments, MDMA (Ecstacy)     Allergies   Lamotrigine   Review of Systems Review of Systems  Constitutional: Negative for chills and fever.  HENT: Negative for ear pain and sore throat.   Eyes: Negative for pain and visual disturbance.  Respiratory: Negative for cough and shortness of breath.   Cardiovascular: Negative for chest pain and palpitations.  Gastrointestinal: Negative for abdominal pain and vomiting.  Genitourinary: Positive for vaginal discharge. Negative for dysuria, flank pain, hematuria and pelvic pain.  Musculoskeletal: Negative for arthralgias and back pain.  Skin: Negative for color change and rash.  Neurological: Negative for seizures and syncope.  All other systems reviewed and are negative.    Physical Exam Triage Vital Signs ED Triage Vitals  Enc Vitals Group     BP 06/16/20 1319 102/68     Pulse Rate 06/16/20 1319 60     Resp 06/16/20 1319 16     Temp 06/16/20 1319 98.2 F (36.8 C)     Temp Source 06/16/20 1319 Oral     SpO2 06/16/20 1319 100 %     Weight --      Height --      Head Circumference --      Peak Flow --      Pain Score 06/16/20 1318 5     Pain Loc --      Pain Edu? --      Excl. in GC? --    No data found.  Updated Vital Signs BP 102/68 (BP Location: Right Arm)    Pulse 60    Temp 98.2 F (36.8 C) (Oral)    Resp 16    LMP 06/10/2020    SpO2 100%   Visual Acuity Right Eye Distance:   Left Eye Distance:   Bilateral Distance:    Right Eye Near:   Left Eye Near:    Bilateral Near:     Physical Exam   UC Treatments / Results  Labs (all labs ordered are listed, but only abnormal results are displayed) Labs Reviewed  POCT  URINALYSIS DIPSTICK, ED / UC - Abnormal; Notable for the following components:      Result Value   pH 8.5 (*)    Leukocytes,Ua TRACE (*)    All other components within normal limits  URINE CULTURE  POC URINE PREG, ED  CERVICOVAGINAL ANCILLARY ONLY    EKG   Radiology No results found.  Procedures Procedures (including critical care time)  Medications Ordered in UC Medications  cefTRIAXone (  ROCEPHIN) injection 500 mg (has no administration in time range)    Initial Impression / Assessment and Plan / UC Course  I have reviewed the triage vital signs and the nursing notes.  Pertinent labs & imaging results that were available during my care of the patient were reviewed by me and considered in my medical decision making (see chart for details).   Vaginal discharge, potential STD.  Treated with Rocephin, doxycycline, metronidazole, fluconazole.  Instructed patient to abstain from sexual activity for the next 7 days.  Discussed that if her test results are positive, we will call her.  Discussed that she and her sexual partners may require treatment at that time.  Patient agrees to plan of care.   Final Clinical Impressions(s) / UC Diagnoses   Final diagnoses:  Vaginal discharge  Possible exposure to STD   Discharge Instructions   None    ED Prescriptions    Medication Sig Dispense Auth. Provider   metroNIDAZOLE (FLAGYL) 500 MG tablet Take 1 tablet (500 mg total) by mouth 2 (two) times daily. 14 tablet Mickie Bail, NP   doxycycline (VIBRAMYCIN) 100 MG capsule Take 1 capsule (100 mg total) by mouth 2 (two) times daily for 7 days. 14 capsule Mickie Bail, NP   fluconazole (DIFLUCAN) 150 MG tablet Take 1 tablet (150 mg total) by mouth daily. Take one tablet today.  May repeat in 3 days. 2 tablet Mickie Bail, NP     PDMP not reviewed this encounter.   Mickie Bail, NP 06/16/20 1423

## 2020-06-16 NOTE — Discharge Instructions (Addendum)
You were treated with an antibiotic today called Rocephin.  Take metronidazole twice a day for 7 days and doxycycline twice a day for 7 days.  Take the Diflucan as directed.    Your vaginal tests are pending.  If your test results are positive, we will call you.  You and your sexual partner(s) may require treatment at that time.  Do not have sexual activity until the test results are back.

## 2020-06-16 NOTE — ED Triage Notes (Signed)
Pt present vaginal discharge (white) with some itching and burning sensation. Symptoms started 3 days ago.

## 2020-06-17 LAB — CERVICOVAGINAL ANCILLARY ONLY
Bacterial Vaginitis (gardnerella): POSITIVE — AB
Candida Glabrata: POSITIVE — AB
Candida Vaginitis: POSITIVE — AB
Chlamydia: NEGATIVE
Comment: NEGATIVE
Comment: NEGATIVE
Comment: NEGATIVE
Comment: NEGATIVE
Comment: NEGATIVE
Comment: NORMAL
Neisseria Gonorrhea: NEGATIVE
Trichomonas: NEGATIVE

## 2020-06-17 LAB — URINE CULTURE

## 2020-07-03 ENCOUNTER — Encounter (HOSPITAL_COMMUNITY): Payer: Self-pay

## 2020-07-03 ENCOUNTER — Other Ambulatory Visit: Payer: Self-pay

## 2020-07-03 ENCOUNTER — Ambulatory Visit (HOSPITAL_COMMUNITY)
Admission: RE | Admit: 2020-07-03 | Discharge: 2020-07-03 | Disposition: A | Discharge status: Home / Self Care | Payer: Self-pay | Source: Ambulatory Visit | Attending: Family Medicine | Admitting: Family Medicine

## 2020-07-03 VITALS — BP 105/75 | HR 85 | Temp 98.7°F | Resp 18

## 2020-07-03 DIAGNOSIS — F17200 Nicotine dependence, unspecified, uncomplicated: Secondary | ICD-10-CM | POA: Insufficient documentation

## 2020-07-03 DIAGNOSIS — R079 Chest pain, unspecified: Secondary | ICD-10-CM | POA: Insufficient documentation

## 2020-07-03 DIAGNOSIS — J029 Acute pharyngitis, unspecified: Secondary | ICD-10-CM | POA: Insufficient documentation

## 2020-07-03 DIAGNOSIS — Z20822 Contact with and (suspected) exposure to covid-19: Secondary | ICD-10-CM | POA: Insufficient documentation

## 2020-07-03 DIAGNOSIS — R059 Cough, unspecified: Secondary | ICD-10-CM | POA: Insufficient documentation

## 2020-07-03 DIAGNOSIS — J069 Acute upper respiratory infection, unspecified: Secondary | ICD-10-CM | POA: Insufficient documentation

## 2020-07-03 HISTORY — DX: Unspecified asthma, uncomplicated: J45.909

## 2020-07-03 LAB — RESP PANEL BY RT PCR (RSV, FLU A&B, COVID)
Influenza A by PCR: NEGATIVE
Influenza B by PCR: NEGATIVE
Respiratory Syncytial Virus by PCR: NEGATIVE
SARS Coronavirus 2 by RT PCR: NEGATIVE

## 2020-07-03 MED ORDER — ALBUTEROL SULFATE HFA 108 (90 BASE) MCG/ACT IN AERS
1.0000 | INHALATION_SPRAY | Freq: Four times a day (QID) | RESPIRATORY_TRACT | 0 refills | Status: DC | PRN
Start: 2020-07-03 — End: 2022-09-24

## 2020-07-03 MED ORDER — PROMETHAZINE-DM 6.25-15 MG/5ML PO SYRP: 5.0000 mL | ORAL_SOLUTION | Freq: Four times a day (QID) | ORAL | 0 refills | Status: DC | PRN

## 2020-07-03 NOTE — ED Triage Notes (Signed)
Pt presents with cough, chest pain, nasal congestion,headache, ear pain and sore throat. States has hx of asthma.

## 2020-07-03 NOTE — ED Provider Notes (Signed)
[facility]    CSN: [id number] Arrival date & time: 07/03/20  0807      History   Chief Complaint Chief Complaint  Patient presents with  . Chest Pain    APPT   . Sore Throat  . Chills  . Cough    HPI Tracy Guzman is a 24 y.o. female.   Patient presenting today with 1-2 day hx of headache, sore throat, congestion, cough, chest tightness and soreness, fatigue, body aches, chills. Denies SOB, wheezing, N/V/D, abdominal pain, rash. So far taking mucinex without much relief. Does have a hx of asthma, not currently on any treatments. Denies known sick contacts, recent travel. UTD on vaccines.       Past Medical History:  Diagnosis Date  . Asthma     There are no problems to display for this patient.   History reviewed. No pertinent surgical history.  OB History   No obstetric history on file.      Home Medications    Prior to Admission medications   Medication Sig Start Date End Date Taking? Authorizing Provider  albuterol (VENTOLIN HFA) 108 (90 Base) MCG/ACT inhaler Inhale 1-2 puffs into the lungs every 6 (six) hours as needed for wheezing or shortness of breath. 07/03/20   Particia Nearing, PA-C  promethazine-dextromethorphan (PROMETHAZINE-DM) 6.25-15 MG/5ML syrup Take 5 mLs by mouth 4 (four) times daily as needed for cough. 07/03/20   Particia Nearing, PA-C    Family History Family History  Problem Relation Age of Onset  . Hypertension Mother   . Hypertension Father     Social History Social History   Tobacco Use  . Smoking status: Current Every Day Smoker  . Smokeless tobacco: Never Used  Substance Use Topics  . Alcohol use: Never  . Drug use: Not on file     Allergies   Lamictal [lamotrigine]   Review of Systems Review of Systems PER HPI   Physical Exam Triage Vital Signs ED Triage Vitals  Enc Vitals Group     BP 07/03/20 0828 105/75     Pulse Rate 07/03/20 0828 85     Resp 07/03/20 0828 18     Temp  07/03/20 0828 98.7 F (37.1 C)     Temp Source 07/03/20 0828 Oral     SpO2 07/03/20 0828 98 %     Weight --      Height --      Head Circumference --      Peak Flow --      Pain Score 07/03/20 0824 6     Pain Loc --      Pain Edu? --      Excl. in GC? --    No data found.  Updated Vital Signs BP 105/75 (BP Location: Left Arm)   Pulse 85   Temp 98.7 F (37.1 C) (Oral)   Resp 18   LMP 05/14/2020   SpO2 98%   Visual Acuity Right Eye Distance:   Left Eye Distance:   Bilateral Distance:    Right Eye Near:   Left Eye Near:    Bilateral Near:     Physical Exam Vitals and nursing note reviewed.  Constitutional:      Appearance: Normal appearance. She is not ill-appearing.  HENT:     Head: Atraumatic.     Right Ear: Tympanic membrane normal.     Left Ear: Tympanic membrane normal.     Nose: Congestion present.     Mouth/Throat:  Mouth: Mucous membranes are moist.     Pharynx: Posterior oropharyngeal erythema present.  Eyes:     Extraocular Movements: Extraocular movements intact.     Conjunctiva/sclera: Conjunctivae normal.  Cardiovascular:     Rate and Rhythm: Normal rate and regular rhythm.     Heart sounds: Normal heart sounds.  Pulmonary:     Effort: Pulmonary effort is normal.     Breath sounds: Normal breath sounds.  Abdominal:     General: Bowel sounds are normal. There is no distension.     Palpations: Abdomen is soft.     Tenderness: There is no abdominal tenderness. There is no guarding.  Musculoskeletal:        General: Normal range of motion.     Cervical back: Normal range of motion and neck supple.  Skin:    General: Skin is warm and dry.  Neurological:     Mental Status: She is alert and oriented to person, place, and time.  Psychiatric:        Mood and Affect: Mood normal.        Thought Content: Thought content normal.        Judgment: Judgment normal.      UC Treatments / Results  Labs (all labs ordered are listed, but only  abnormal results are displayed) Labs Reviewed  RESP PANEL BY RT PCR (RSV, FLU A&B, COVID)    EKG   Radiology No results found.  Procedures Procedures (including critical care time)  Medications Ordered in UC Medications - No data to display  Initial Impression / Assessment and Plan / UC Course  I have reviewed the triage vital signs and the nursing notes.  Pertinent labs & imaging results that were available during my care of the patient were reviewed by me and considered in my medical decision making (see chart for details).     Suspicious for COVID or flu, resp panel pending, work note given with isolation protocol reviewed. Phenergan DM, albuterol inhaler sent and discussed OTC medications and supportive care. F/u if not resolving.   Final Clinical Impressions(s) / UC Diagnoses   Final diagnoses:  Viral URI with cough   Discharge Instructions   None    ED Prescriptions    Medication Sig Dispense Auth. Provider   albuterol (VENTOLIN HFA) 108 (90 Base) MCG/ACT inhaler Inhale 1-2 puffs into the lungs every 6 (six) hours as needed for wheezing or shortness of breath. 18 g Roosvelt Maser [place], [place]   promethazine-dextromethorphan (PROMETHAZINE-DM) 6.25-15 MG/5ML syrup Take 5 mLs by mouth 4 (four) times daily as needed for cough. 100 mL Particia Nearing, [place]     PDMP not reviewed this encounter.   Roosvelt Maser [place], [place] 07/03/20 [phone]

## 2020-08-20 ENCOUNTER — Encounter (HOSPITAL_COMMUNITY): Payer: Self-pay | Admitting: Urgent Care

## 2020-08-20 ENCOUNTER — Ambulatory Visit (HOSPITAL_COMMUNITY)
Admission: EM | Admit: 2020-08-20 | Discharge: 2020-08-20 | Disposition: A | Discharge status: Home / Self Care | Payer: Self-pay | Attending: Urgent Care | Admitting: Urgent Care

## 2020-08-20 ENCOUNTER — Other Ambulatory Visit: Payer: Self-pay

## 2020-08-20 DIAGNOSIS — R102 Pelvic and perineal pain: Secondary | ICD-10-CM | POA: Insufficient documentation

## 2020-08-20 DIAGNOSIS — S0512XA Contusion of eyeball and orbital tissues, left eye, initial encounter: Secondary | ICD-10-CM | POA: Insufficient documentation

## 2020-08-20 DIAGNOSIS — H5712 Ocular pain, left eye: Secondary | ICD-10-CM | POA: Insufficient documentation

## 2020-08-20 DIAGNOSIS — R350 Frequency of micturition: Secondary | ICD-10-CM | POA: Insufficient documentation

## 2020-08-20 DIAGNOSIS — R3915 Urgency of urination: Secondary | ICD-10-CM | POA: Insufficient documentation

## 2020-08-20 LAB — POCT URINALYSIS DIPSTICK, ED / UC
Bilirubin Urine: NEGATIVE
Glucose, UA: NEGATIVE mg/dL
Hgb urine dipstick: NEGATIVE
Ketones, ur: NEGATIVE mg/dL
Leukocytes,Ua: NEGATIVE
Nitrite: NEGATIVE
Protein, ur: NEGATIVE mg/dL
Specific Gravity, Urine: 1.025 (ref 1.005–1.030)
Urobilinogen, UA: 0.2 mg/dL (ref 0.0–1.0)
pH: 7 (ref 5.0–8.0)

## 2020-08-20 LAB — POC URINE PREG, ED: Preg Test, Ur: NEGATIVE

## 2020-08-20 MED ORDER — NAPROXEN 375 MG PO TABS: 375.0000 mg | ORAL_TABLET | Freq: Two times a day (BID) | ORAL | 0 refills | Status: DC

## 2020-08-20 NOTE — ED Triage Notes (Signed)
Pt presents with pain, blurry vision and floaters in the left eye 4 x days. Reports she tripped and hit the corner of a table and hit the left eyebrow.  Reports having burning when urinating x 5 days.

## 2020-08-20 NOTE — Discharge Instructions (Addendum)
Make sure you hydrate very well with plain water and a quantity of 64 ounces of water a day.  Please limit drinks that are considered urinary irritants such as soda, sweet tea, coffee, energy drinks, alcohol.  These can worsen your urinary symptoms and also be the source of them.  I will let you know about your urine culture results through MyChart to see if we need to prescribe antibiotics based off of those results. ° °

## 2020-08-20 NOTE — ED Provider Notes (Signed)
[facility]   MRN: [id number] DOB: 1996-03-31  Subjective:   Tracy Guzman is a 24 y.o. female presenting for 2-day history of left eye pain, left eyebrow laceration and intermittent floaters.  Patient states that she fell accidentally when she woke up in the middle of the night.  She put on her shoes and tripped, fell onto the corner of a table.  She did not lose consciousness but noticed that she had a laceration that bled for approximately an hour before it stopped with pressure dressing.  Patient states that she did use peroxide and keep her wound clean.  Last Tdap was in 2017.  Denies headache, confusion, weakness, numbness or tingling.  She has also had recurrent urinary frequency and urgency, pelvic pain.  Patient is sexually active with one female partner, does not use condoms for protection.  She does have a history of BV and yeast infections, just finished a course of medications for both at the end of November.  Denies vaginal discharge, dysuria, hematuria, flank pain.  She does have a history of UTIs.  Admits that she does not drink water.  She drinks coffee and lemonade.  No current facility-administered medications for this encounter.  Current Outpatient Medications:  .  albuterol (VENTOLIN HFA) 108 (90 Base) MCG/ACT inhaler, Inhale 1-2 puffs into the lungs every 6 (six) hours as needed for wheezing or shortness of breath., Disp: 18 g, Rfl: 0 .  ARIPiprazole (ABILIFY) 5 MG tablet, Take 1 tablet (5 mg total) by mouth at bedtime. (Patient not taking: No sig reported), Disp: 30 tablet, Rfl: 2 .  fluconazole (DIFLUCAN) 150 MG tablet, Take 1 tablet (150 mg total) by mouth daily. Take one tablet today.  May repeat in 3 days., Disp: 2 tablet, Rfl: 0 .  metoCLOPramide (REGLAN) 10 MG tablet, Take 1 tablet (10 mg total) by mouth every 6 (six) hours as needed for nausea or vomiting., Disp: 12 tablet, Rfl: 0 .  metroNIDAZOLE (FLAGYL) 500 MG tablet, Take 1 tablet (500 mg  total) by mouth 2 (two) times daily., Disp: 14 tablet, Rfl: 0 .  naproxen (NAPROSYN) 500 MG tablet, Take 1 tablet (500 mg total) by mouth 2 (two) times daily., Disp: 30 tablet, Rfl: 0 .  omeprazole (PRILOSEC) 20 MG capsule, Take 1 capsule (20 mg total) by mouth daily., Disp: 30 capsule, Rfl: 0 .  ondansetron (ZOFRAN ODT) 4 MG disintegrating tablet, Take 1 tablet (4 mg total) by mouth every 8 (eight) hours as needed for nausea or vomiting., Disp: 10 tablet, Rfl: 0 .  ondansetron (ZOFRAN) 4 MG tablet, Take 1-2 tablets (4-8 mg total) by mouth every 6 (six) hours., Disp: 20 tablet, Rfl: 0 .  promethazine-dextromethorphan (PROMETHAZINE-DM) 6.25-15 MG/5ML syrup, Take 5 mLs by mouth 4 (four) times daily as needed for cough., Disp: 100 mL, Rfl: 0 .  sucralfate (CARAFATE) 1 g tablet, Take 1 tablet (1 g total) by mouth 4 (four) times daily -  with meals and at bedtime., Disp: 30 tablet, Rfl: 0 .  venlafaxine (EFFEXOR) 75 MG tablet, Take 1 tablet (75 mg total) by mouth daily with breakfast. (Patient not taking: No sig reported), Disp: 30 tablet, Rfl: 2   Allergies  Allergen Reactions  . Lamotrigine Rash    Past Medical History:  Diagnosis Date  . Allergy    Smoke, dust, Pollen  . Asthma   . Asthma   . Depression   . Endometriosis   . GERD (gastroesophageal reflux disease)  Past Surgical History:  Procedure Laterality Date  . MYRINGOTOMY    . TONSILLECTOMY AND ADENOIDECTOMY  as infant  . WRIST SURGERY  age 56    Family History  Problem Relation Age of Onset  . Hypertension Mother   . Hypertension Father   . Depression Mother   . Hyperlipidemia Mother   . Depression Maternal Grandmother   . Drug abuse Cousin   . Drug abuse Maternal Uncle   . Hyperlipidemia Father     Social History   Tobacco Use  . Smoking status: Current Every Day Smoker    Packs/day: 1.00    Years: 1.00    Pack years: 1.00    Types: Cigarettes  . Smokeless tobacco: Never Used  Substance Use Topics  .  Alcohol use: Never    Comment: occasional  . Drug use: Not Currently    Types: Other-see comments, MDMA (Ecstacy)    ROS   Objective:   Vitals: BP 116/72 (BP Location: Right Arm)   Pulse 81   Temp 98.2 F (36.8 C) (Oral)   Resp 17   LMP 08/02/2020 (Exact Date)   SpO2 97%   Physical Exam Constitutional:      General: She is not in acute distress.    Appearance: Normal appearance. She is well-developed. She is not ill-appearing, toxic-appearing or diaphoretic.  HENT:     Head: Normocephalic and atraumatic.     Nose: Nose normal.     Mouth/Throat:     Mouth: Mucous membranes are moist.     Pharynx: Oropharynx is clear.  Eyes:     General: No scleral icterus.       Right eye: No foreign body, discharge or hordeolum.        Left eye: No foreign body, discharge or hordeolum.     Extraocular Movements: Extraocular movements intact.     Conjunctiva/sclera: Conjunctivae normal.     Right eye: Right conjunctiva is not injected. No chemosis, exudate or hemorrhage.    Left eye: Left conjunctiva is not injected. No chemosis, exudate or hemorrhage.    Pupils: Pupils are equal, round, and reactive to light.   Cardiovascular:     Rate and Rhythm: Normal rate.  Pulmonary:     Effort: Pulmonary effort is normal.  Abdominal:     General: Bowel sounds are normal. There is no distension.     Palpations: Abdomen is soft. There is no mass.     Tenderness: There is no abdominal tenderness. There is no right CVA tenderness, left CVA tenderness, guarding or rebound.  Skin:    General: Skin is warm and dry.  Neurological:     General: No focal deficit present.     Mental Status: She is alert and oriented to person, place, and time.  Psychiatric:        Mood and Affect: Mood normal.        Behavior: Behavior normal.        Thought Content: Thought content normal.        Judgment: Judgment normal.     Results for orders placed or performed during the hospital encounter of 08/20/20  (from the past 24 hour(s))  POC Urinalysis dipstick     Status: None   Collection Time: 08/20/20  8:38 AM  Result Value Ref Range   Glucose, UA NEGATIVE NEGATIVE mg/dL   Bilirubin Urine NEGATIVE NEGATIVE   Ketones, ur NEGATIVE NEGATIVE mg/dL   Specific Gravity, Urine 1.025 1.005 - 1.030   Hgb  urine dipstick NEGATIVE NEGATIVE   pH 7.0 5.0 - 8.0   Protein, ur NEGATIVE NEGATIVE mg/dL   Urobilinogen, UA 0.2 0.0 - 1.0 mg/dL   Nitrite NEGATIVE NEGATIVE   Leukocytes,Ua NEGATIVE NEGATIVE  POC urine pregnancy     Status: None   Collection Time: 08/20/20  8:50 AM  Result Value Ref Range   Preg Test, Ur NEGATIVE NEGATIVE    Assessment and Plan :   PDMP not reviewed this encounter.  1. Contusion of left eye, initial encounter   2. Acute left eye pain   3. Urinary frequency   4. Urinary urgency   5. Acute pelvic pain, female     Counseled on general management of an eye contusion.  Low suspicion for fracture but advised that she come back and recheck with [place] if symptoms persist.  Recommended conservative management with rest, Tylenol and naproxen.  Labs pending for her urinary and pelvic symptoms.  Emphasized need to start hydrating better with water.  Will await for lab results to prescribe medications that she just finished around for BV and yeast infections. Counseled patient on potential for adverse effects with medications prescribed/recommended today, ER and return-to-clinic precautions discussed, patient verbalized understanding.    Wallis Bamberg, [place] 08/20/20 [phone]

## 2020-08-21 LAB — CERVICOVAGINAL ANCILLARY ONLY
Bacterial Vaginitis (gardnerella): NEGATIVE
Candida Glabrata: NEGATIVE
Candida Vaginitis: POSITIVE — AB
Chlamydia: NEGATIVE
Comment: NEGATIVE
Comment: NEGATIVE
Comment: NEGATIVE
Comment: NEGATIVE
Comment: NEGATIVE
Comment: NORMAL
Neisseria Gonorrhea: NEGATIVE
Trichomonas: NEGATIVE

## 2020-08-21 LAB — URINE CULTURE: Culture: <10000 — AB

## 2020-08-22 ENCOUNTER — Telehealth (HOSPITAL_COMMUNITY): Payer: Self-pay | Admitting: Emergency Medicine

## 2020-08-22 MED ORDER — FLUCONAZOLE 150 MG PO TABS: 150.0000 mg | ORAL_TABLET | Freq: Once | ORAL | 0 refills | Status: AC

## 2020-08-30 ENCOUNTER — Encounter: Payer: Self-pay | Admitting: Emergency Medicine

## 2020-08-30 ENCOUNTER — Ambulatory Visit
Admission: EM | Admit: 2020-08-30 | Discharge: 2020-08-30 | Disposition: A | Discharge status: Home / Self Care | Payer: Medicaid Other | Attending: Emergency Medicine | Admitting: Emergency Medicine

## 2020-08-30 ENCOUNTER — Other Ambulatory Visit: Payer: Self-pay

## 2020-08-30 DIAGNOSIS — Z20822 Contact with and (suspected) exposure to covid-19: Secondary | ICD-10-CM

## 2020-08-30 DIAGNOSIS — J069 Acute upper respiratory infection, unspecified: Secondary | ICD-10-CM

## 2020-08-30 NOTE — ED Triage Notes (Signed)
Pt here for cough and nasal congestion with pain with cough x 2 days

## 2020-08-30 NOTE — Discharge Instructions (Signed)
Covid test pending Please use over-the-counter medicines safe in pregnancy Rest and fluids Tylenol for fevers headaches body aches Follow-up if not improving or worsening

## 2020-08-30 NOTE — ED Provider Notes (Addendum)
[facility]    CSN: [id number] Arrival date & time: 08/30/20  1159      History   Chief Complaint Chief Complaint  Patient presents with  . Cough    HPI Tracy Guzman is a 24 y.o. female reported [redacted] weeks pregnant presenting today for evaluation of a cough.  Reports 2 days of cough and congestion.  Also feeling achy and fatigued.  Denies known exposure to Covid.  Denies known fevers.  HPI  Past Medical History:  Diagnosis Date  . Allergy    Smoke, dust, Pollen  . Asthma   . Asthma   . Depression   . Endometriosis   . GERD (gastroesophageal reflux disease)     Patient Active Problem List   Diagnosis Date Noted  . MDD (major depressive disorder), recurrent episode, moderate (HCC) 08/16/2013  . PTSD (post-traumatic stress disorder) 08/15/2013  . Oppositional defiant disorder 06/06/2012  . Suicidal ideation 02/12/2012  . Polysubstance abuse (HCC) 02/12/2012  . BACK PAIN 10/16/2010  . ASTHMA 06/23/2010  . GERD 06/23/2010    Past Surgical History:  Procedure Laterality Date  . MYRINGOTOMY    . TONSILLECTOMY AND ADENOIDECTOMY  as infant  . WRIST SURGERY  age 66    OB History    Gravida  1   Para      Term      Preterm      AB      Living        SAB      IAB      Ectopic      Multiple      Live Births               Home Medications    Prior to Admission medications   Medication Sig Start Date End Date Taking? Authorizing Provider  albuterol (VENTOLIN HFA) 108 (90 Base) MCG/ACT inhaler Inhale 1-2 puffs into the lungs every 6 (six) hours as needed for wheezing or shortness of breath. 07/03/20   Particia Nearing, PA-C  ARIPiprazole (ABILIFY) 5 MG tablet Take 1 tablet (5 mg total) by mouth at bedtime. Patient not taking: No sig reported 08/22/13   [name] B, NP  metoCLOPramide (REGLAN) 10 MG tablet Take 1 tablet (10 mg total) by mouth every 6 (six) hours as needed for nausea or vomiting. Patient not taking:  Reported on 08/30/2020 07/31/19   Muthersbaugh, Dahlia Client, PA-C  metroNIDAZOLE (FLAGYL) 500 MG tablet Take 1 tablet (500 mg total) by mouth 2 (two) times daily. Patient not taking: Reported on 08/30/2020 06/16/20   Mickie Bail, NP  naproxen (NAPROSYN) 375 MG tablet Take 1 tablet (375 mg total) by mouth 2 (two) times daily with a meal. 08/20/20   Wallis Bamberg, PA-C  omeprazole (PRILOSEC) 20 MG capsule Take 1 capsule (20 mg total) by mouth daily. 07/31/19   Muthersbaugh, Dahlia Client, PA-C  ondansetron (ZOFRAN ODT) 4 MG disintegrating tablet Take 1 tablet (4 mg total) by mouth every 8 (eight) hours as needed for nausea or vomiting. 05/14/14   Renne Crigler, PA-C  ondansetron (ZOFRAN) 4 MG tablet Take 1-2 tablets (4-8 mg total) by mouth every 6 (six) hours. 04/26/20   Eustace Moore, MD  promethazine-dextromethorphan (PROMETHAZINE-DM) 6.25-15 MG/5ML syrup Take 5 mLs by mouth 4 (four) times daily as needed for cough. Patient not taking: Reported on 08/30/2020 07/03/20   Particia Nearing, PA-C  sucralfate (CARAFATE) 1 g tablet Take 1 tablet (1 g total) by mouth 4 (four)  times daily -  with meals and at bedtime. 07/31/19   Muthersbaugh, Dahlia Client, PA-C  venlafaxine (EFFEXOR) 75 MG tablet Take 1 tablet (75 mg total) by mouth daily with breakfast. Patient not taking: No sig reported 08/22/13   Winson, Selena Batten B, NP  mometasone-formoterol (DULERA) 100-5 MCG/ACT AERO Inhale 2 puffs into the lungs 2 (two) times daily.  04/18/20  [provider]  promethazine (PHENERGAN) 25 MG suppository Place 1 suppository (25 mg total) rectally every 6 (six) hours as needed for nausea or vomiting. 07/31/19 10/23/19  Muthersbaugh, Dahlia Client, PA-C    Family History Family History  Problem Relation Age of Onset  . Hypertension Mother   . Hypertension Father   . Depression Mother   . Hyperlipidemia Mother   . Depression Maternal Grandmother   . Drug abuse Cousin   . Drug abuse Maternal Uncle   . Hyperlipidemia Father      Social History Social History   Tobacco Use  . Smoking status: Current Every Day Smoker    Packs/day: 1.00    Years: 1.00    Pack years: 1.00    Types: Cigarettes  . Smokeless tobacco: Never Used  Substance Use Topics  . Alcohol use: Never    Comment: occasional  . Drug use: Not Currently    Types: Other-see comments, MDMA (Ecstacy)     Allergies   Lamotrigine   Review of Systems Review of Systems  Constitutional: Positive for fatigue. Negative for activity change, appetite change, chills and fever.  HENT: Positive for congestion and rhinorrhea. Negative for ear pain, sinus pressure, sore throat and trouble swallowing.   Eyes: Negative for discharge and redness.  Respiratory: Positive for cough. Negative for chest tightness and shortness of breath.   Cardiovascular: Negative for chest pain.  Gastrointestinal: Negative for abdominal pain, diarrhea, nausea and vomiting.  Musculoskeletal: Negative for myalgias.  Skin: Negative for rash.  Neurological: Negative for dizziness, light-headedness and headaches.     Physical Exam Triage Vital Signs ED Triage Vitals [08/30/20 1251]  Enc Vitals Group     BP 108/73     Pulse Rate 88     Resp 18     Temp 98.6 F (37 C)     Temp Source Oral     SpO2 99 %     Weight      Height      Head Circumference      Peak Flow      Pain Score      Pain Loc      Pain Edu?      Excl. in GC?    No data found.  Updated Vital Signs BP 108/73 (BP Location: Left Arm)   Pulse 88   Temp 98.6 F (37 C) (Oral)   Resp 18   LMP 08/02/2020 (Exact Date)   SpO2 99%   Visual Acuity Right Eye Distance:   Left Eye Distance:   Bilateral Distance:    Right Eye Near:   Left Eye Near:    Bilateral Near:     Physical Exam Vitals and nursing note reviewed.  Constitutional:      Appearance: She is well-developed and well-nourished.     Comments: No acute distress  HENT:     Head: Normocephalic and atraumatic.     Ears:      Comments: Bilateral ears without tenderness to palpation of external auricle, tragus and mastoid, EAC's without erythema or swelling, TM's with good bony landmarks and cone of light. Non  erythematous.     Nose: Nose normal.     Mouth/Throat:     Comments: Oral mucosa pink and moist, no tonsillar enlargement or exudate. Posterior pharynx patent and nonerythematous, no uvula deviation or swelling. Normal phonation. Eyes:     Conjunctiva/sclera: Conjunctivae normal.  Cardiovascular:     Rate and Rhythm: Normal rate.  Pulmonary:     Effort: Pulmonary effort is normal. No respiratory distress.     Comments: Breathing comfortably at rest, CTABL, no wheezing, rales or other adventitious sounds auscultated Abdominal:     General: There is no distension.  Musculoskeletal:        General: Normal range of motion.     Cervical back: Neck supple.  Skin:    General: Skin is warm and dry.  Neurological:     Mental Status: She is alert and oriented to person, place, and time.  Psychiatric:        Mood and Affect: Mood and affect normal.      UC Treatments / Results  Labs (all labs ordered are listed, but only abnormal results are displayed) Labs Reviewed  NOVEL CORONAVIRUS, NAA    EKG   Radiology No results found.  Procedures Procedures (including critical care time)  Medications Ordered in UC Medications - No data to display  Initial Impression / Assessment and Plan / UC Course  I have reviewed the triage vital signs and the nursing notes.  Pertinent labs & imaging results that were available during my care of the patient were reviewed by me and considered in my medical decision making (see chart for details).     URI symptoms x2 days, exam reassuring, Covid test pending.  Recommending symptomatic and supportive care.  Discussed strict return precautions. Patient verbalized understanding and is agreeable with plan.  Final Clinical Impressions(s) / UC Diagnoses   Final  diagnoses:  Encounter for screening laboratory testing for COVID-19 virus  Viral URI with cough     Discharge Instructions     Covid test pending Please use over-the-counter medicines safe in pregnancy Rest and fluids Tylenol for fevers headaches body aches Follow-up if not improving or worsening    ED Prescriptions    None     PDMP not reviewed this encounter.   Lew Dawes, PA-C 08/30/20 1338    Lew Dawes, PA-C 08/30/20 1338

## 2020-09-04 LAB — NOVEL CORONAVIRUS, NAA: SARS-CoV-2, NAA: NOT DETECTED

## 2020-09-07 NOTE — L&D Delivery Note (Addendum)
OB/GYN Faculty Practice Delivery Note  Cina Klumpp is a 25 y.o. G3P1102 at [redacted]w[redacted]d. She was admitted for elective IOL.   ROM: 3h 3m with clear fluid GBS Status: positive, received adequate PCN ppx Maximum Maternal Temperature: 98.2*F  Labor Progress: Patient received cytotec x1 and FB. Then started pitocin and progressed to complete.  Delivery Date/Time: 05/04/21 at 1826 Delivery: Called to room and patient was complete and pushing. Head delivered ROA. No nuchal cord present. Shoulder and body delivered in usual fashion. Infant with spontaneous cry, placed on mother's abdomen, dried and stimulated. Cord clamped x 2 after 1-minute delay, and cut by doula. Cord blood drawn. Placenta delivered spontaneously with gentle cord traction. Fundus firm with massage and Pitocin. Labia, perineum, vagina, and cervix inspected with no lacerations.   Placenta: spontaneous, intact, 3VC Complications: none Lacerations: none EBL: 154 Analgesia: epidural  Postpartum Planning [ ]  message to sent to schedule follow-up  [ ]  vaccines UTD  Infant: viable female  APGARs 9,9  weight pending medical chart  , MD [facility], PGY-3 05/04/2021, 6:47 PM   I was gloved and present for entire delivery SVD without incident No difficulty with shoulders No lacerations  Postpartum visit request sent by [facility], CNM on 05/04/2021  Thalia Bloodgood, MSN, CNM Certified Nurse Midwife, [facility] [place] [facility], [facility] 05/04/21 8:00 PM

## 2020-09-16 ENCOUNTER — Ambulatory Visit: Payer: Medicaid Other | Admitting: Obstetrics and Gynecology

## 2020-09-18 ENCOUNTER — Other Ambulatory Visit: Payer: Self-pay

## 2020-09-18 ENCOUNTER — Other Ambulatory Visit (HOSPITAL_COMMUNITY)
Admission: RE | Admit: 2020-09-18 | Discharge: 2020-09-18 | Disposition: A | Discharge status: Home / Self Care | Payer: Medicaid Other | Source: Ambulatory Visit | Attending: Obstetrics and Gynecology | Admitting: Obstetrics and Gynecology

## 2020-09-18 ENCOUNTER — Encounter: Payer: Self-pay | Admitting: Women's Health

## 2020-09-18 ENCOUNTER — Ambulatory Visit (INDEPENDENT_AMBULATORY_CARE_PROVIDER_SITE_OTHER): Payer: Medicaid Other

## 2020-09-18 VITALS — BP 101/62 | HR 61 | Ht 64.0 in | Wt 138.0 lb

## 2020-09-18 DIAGNOSIS — N898 Other specified noninflammatory disorders of vagina: Secondary | ICD-10-CM | POA: Diagnosis present

## 2020-09-18 DIAGNOSIS — O26891 Other specified pregnancy related conditions, first trimester: Secondary | ICD-10-CM | POA: Diagnosis not present

## 2020-09-18 DIAGNOSIS — R300 Dysuria: Secondary | ICD-10-CM | POA: Diagnosis not present

## 2020-09-18 LAB — POCT URINALYSIS DIPSTICK
Bilirubin, UA: NEGATIVE
Blood, UA: NEGATIVE
Glucose, UA: NEGATIVE
Ketones, UA: NEGATIVE
Nitrite, UA: NEGATIVE
Protein, UA: POSITIVE — AB
Spec Grav, UA: 1.015 (ref 1.010–1.025)
Urobilinogen, UA: 0.2 E.U./dL
pH, UA: 8.5 — AB (ref 5.0–8.0)

## 2020-09-18 MED ORDER — NITROFURANTOIN MONOHYD MACRO 100 MG PO CAPS: 100.0000 mg | ORAL_CAPSULE | Freq: Two times a day (BID) | ORAL | 0 refills | Status: DC

## 2020-09-18 NOTE — Progress Notes (Signed)
Patient was assessed and managed by nursing staff during this encounter. I have reviewed the chart and agree with the documentation and plan as patient stated she did not wish to be assessed by a provider today. I have also made any necessary editorial changes.  Marylen Ponto, NP 09/18/2020 9:50 AM

## 2020-09-18 NOTE — Progress Notes (Addendum)
   SUBJECTIVE: Tracy Guzman is a 25 y.o. female who complains of urinary frequency, urgency and dysuria x 5 days, with flank pain, vaginal discharge. Denies fever, chills,  OBJECTIVE: Appears well, in no apparent distress.  Vital signs are normal. Urine dipstick shows positive for protein and positive for leukocytes.    ASSESSMENT: Dysuria  PLAN: Treatment per orders.  Call or return to clinic prn if these symptoms worsen or fail to improve as anticipated.

## 2020-09-19 LAB — CERVICOVAGINAL ANCILLARY ONLY
Candida Glabrata: NEGATIVE
Candida Vaginitis: NEGATIVE
Chlamydia: NEGATIVE
Comment: NEGATIVE
Comment: NEGATIVE
Comment: NEGATIVE
Comment: NEGATIVE
Comment: NORMAL
Neisseria Gonorrhea: NEGATIVE
Trichomonas: NEGATIVE

## 2020-09-20 LAB — URINE CULTURE

## 2020-10-02 ENCOUNTER — Ambulatory Visit
Admission: EM | Admit: 2020-10-02 | Discharge: 2020-10-02 | Disposition: A | Discharge status: Home / Self Care | Payer: Medicaid Other | Attending: Urgent Care | Admitting: Urgent Care

## 2020-10-02 ENCOUNTER — Ambulatory Visit: Payer: Self-pay

## 2020-10-02 ENCOUNTER — Other Ambulatory Visit: Payer: Self-pay

## 2020-10-02 DIAGNOSIS — Z3A08 8 weeks gestation of pregnancy: Secondary | ICD-10-CM

## 2020-10-02 DIAGNOSIS — N949 Unspecified condition associated with female genital organs and menstrual cycle: Secondary | ICD-10-CM

## 2020-10-02 DIAGNOSIS — K5900 Constipation, unspecified: Secondary | ICD-10-CM

## 2020-10-02 DIAGNOSIS — R1010 Upper abdominal pain, unspecified: Secondary | ICD-10-CM

## 2020-10-02 LAB — POCT URINALYSIS DIP (MANUAL ENTRY)
Bilirubin, UA: NEGATIVE
Blood, UA: NEGATIVE
Glucose, UA: NEGATIVE mg/dL
Ketones, POC UA: NEGATIVE mg/dL
Nitrite, UA: NEGATIVE
Protein Ur, POC: NEGATIVE mg/dL
Spec Grav, UA: 1.02 (ref 1.010–1.025)
Urobilinogen, UA: 1 E.U./dL
pH, UA: 7 (ref 5.0–8.0)

## 2020-10-02 NOTE — ED Triage Notes (Signed)
Patient presents to Urgent Care with complaints of vaginal discomfort and abdominal aching pain since 01/12. Pt is [redacted] weeks pregnant and spoke to her  OB-GYN this morning who instructed her to go to ED or urgent care for further eval. Pt c/o of vaginal irritation, discharge. Hs a hx of recurrent BV.    Denies fever, vaginal bleeding, or hematuria.

## 2020-10-02 NOTE — ED Provider Notes (Signed)
[facility]   MRN: [id number] DOB: 1996-02-17  Subjective:   [name] is a 25 y.o. female presenting for 2-week history of persistent upper abdominal cramping, intermittent vaginal discomfort.  Patient has a history of constipation, has gotten a little worse since she became pregnant.  She is at 8 weeks right now.  Last bowel movement was 7 days ago.  She did contact her obstetrician to ask for advice and may counsel her management of her constipation and pregnancy.  They did recommend she go to the maternal admission unit or in urgent care for further evaluation.  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  She did get checked for STIs but is not opposed to getting this done again.  Denies fever, vomiting, dysuria, hematuria.  She has had urinary urgency but is trying to hydrate much better.  No current facility-administered medications for this encounter.  Current Outpatient Medications:  .  albuterol (VENTOLIN HFA) 108 (90 Base) MCG/ACT inhaler, Inhale 1-2 puffs into the lungs every 6 (six) hours as needed for wheezing or shortness of breath., Disp: 18 g, Rfl: 0 .  ARIPiprazole (ABILIFY) 5 MG tablet, Take 1 tablet (5 mg total) by mouth at bedtime. (Patient not taking: No sig reported), Disp: 30 tablet, Rfl: 2 .  metoCLOPramide (REGLAN) 10 MG tablet, Take 1 tablet (10 mg total) by mouth every 6 (six) hours as needed for nausea or vomiting. (Patient not taking: Reported on 08/30/2020), Disp: 12 tablet, Rfl: 0 .  metroNIDAZOLE (FLAGYL) 500 MG tablet, Take 1 tablet (500 mg total) by mouth 2 (two) times daily. (Patient not taking: Reported on 08/30/2020), Disp: 14 tablet, Rfl: 0 .  naproxen (NAPROSYN) 375 MG tablet, Take 1 tablet (375 mg total) by mouth 2 (two) times daily with a meal., Disp: 30 tablet, Rfl: 0 .  nitrofurantoin, macrocrystal-monohydrate, (MACROBID) 100 MG capsule, Take 1 capsule (100 mg total) by mouth 2 (two) times  daily., Disp: 14 capsule, Rfl: 0 .  omeprazole (PRILOSEC) 20 MG capsule, Take 1 capsule (20 mg total) by mouth daily., Disp: 30 capsule, Rfl: 0 .  ondansetron (ZOFRAN ODT) 4 MG disintegrating tablet, Take 1 tablet (4 mg total) by mouth every 8 (eight) hours as needed for nausea or vomiting., Disp: 10 tablet, Rfl: 0 .  ondansetron (ZOFRAN) 4 MG tablet, Take 1-2 tablets (4-8 mg total) by mouth every 6 (six) hours., Disp: 20 tablet, Rfl: 0 .  promethazine-dextromethorphan (PROMETHAZINE-DM) 6.25-15 MG/5ML syrup, Take 5 mLs by mouth 4 (four) times daily as needed for cough. (Patient not taking: Reported on 08/30/2020), Disp: 100 mL, Rfl: 0 .  sucralfate (CARAFATE) 1 g tablet, Take 1 tablet (1 g total) by mouth 4 (four) times daily -  with meals and at bedtime., Disp: 30 tablet, Rfl: 0 .  venlafaxine (EFFEXOR) 75 MG tablet, Take 1 tablet (75 mg total) by mouth daily with breakfast. (Patient not taking: No sig reported), Disp: 30 tablet, Rfl: 2   Allergies  Allergen Reactions  . Lamotrigine Rash    Past Medical History:  Diagnosis Date  . Allergy    Smoke, dust, Pollen  . Asthma   . Asthma   . Depression   . Endometriosis   . GERD (gastroesophageal reflux disease)      Past Surgical History:  Procedure Laterality Date  . MYRINGOTOMY    . TONSILLECTOMY AND ADENOIDECTOMY  as infant  . WRIST SURGERY  age 26  Family History  Problem Relation Age of Onset  . Hypertension Mother   . Hypertension Father   . Depression Mother   . Hyperlipidemia Mother   . Depression Maternal Grandmother   . Drug abuse Cousin   . Drug abuse Maternal Uncle   . Hyperlipidemia Father     Social History   Tobacco Use  . Smoking status: Current Every Day Smoker    Packs/day: 1.00    Years: 1.00    Pack years: 1.00    Types: Cigarettes  . Smokeless tobacco: Never Used  Substance Use Topics  . Alcohol use: Never    Comment: occasional  . Drug use: Not Currently    Types: Other-see comments, MDMA  (Ecstacy)    ROS   Objective:   Vitals: BP 101/66 (BP Location: Left Arm)   Pulse 78   Temp 98.3 F (36.8 C) (Temporal)   Resp 16   Wt 145 lb (65.8 kg)   LMP 08/02/2020 (Exact Date)   SpO2 98%   BMI 24.89 kg/m   Physical Exam Constitutional:      General: She is not in acute distress.    Appearance: Normal appearance. She is well-developed. She is not ill-appearing, toxic-appearing or diaphoretic.  HENT:     Head: Normocephalic and atraumatic.     Nose: Nose normal.     Mouth/Throat:     Mouth: Mucous membranes are moist.     Pharynx: Oropharynx is clear.  Eyes:     General: No scleral icterus.       Right eye: No discharge.        Left eye: No discharge.     Extraocular Movements: Extraocular movements intact.     Conjunctiva/sclera: Conjunctivae normal.     Pupils: Pupils are equal, round, and reactive to light.  Cardiovascular:     Rate and Rhythm: Normal rate.  Pulmonary:     Effort: Pulmonary effort is normal.  Abdominal:     General: Bowel sounds are normal. There is no distension.     Palpations: Abdomen is soft. There is no mass.     Tenderness: There is abdominal tenderness (mild, upper). There is no right CVA tenderness, left CVA tenderness, guarding or rebound.  Skin:    General: Skin is warm and dry.  Neurological:     General: No focal deficit present.     Mental Status: She is alert and oriented to person, place, and time.  Psychiatric:        Mood and Affect: Mood normal.        Behavior: Behavior normal.        Thought Content: Thought content normal.        Judgment: Judgment normal.     Results for orders placed or performed during the hospital encounter of 10/02/20 (from the past 24 hour(s))  POCT urinalysis dipstick     Status: Abnormal   Collection Time: 10/02/20  3:05 PM  Result Value Ref Range   Color, UA yellow yellow   Clarity, UA clear clear   Glucose, UA negative negative mg/dL   Bilirubin, UA negative negative   Ketones,  POC UA negative negative mg/dL   Spec Grav, UA 6.283 6.629 - 1.025   Blood, UA negative negative   pH, UA 7.0 5.0 - 8.0   Protein Ur, POC negative negative mg/dL   Urobilinogen, UA 1.0 0.2 or 1.0 E.U./dL   Nitrite, UA Negative Negative   Leukocytes, UA Trace (A) Negative  Assessment and Plan :   PDMP not reviewed this encounter.  1. Upper abdominal pain   2. Vaginal discomfort   3. [redacted] weeks gestation of pregnancy   4. Constipation, unspecified constipation type     Patient has very mild tenderness on exam of the upper abdomen.  I suspect that constipation is the main source of her symptoms.  Counseled on signs and symptoms warranting an MAU visit.  Patient prefers to wait on this and will try to eat fibrous foods, follow her obstetricians instructions on managing her constipation.  Cervical swab results pending. Counseled patient on potential for adverse effects with medications prescribed/recommended today, ER and return-to-clinic precautions discussed, patient verbalized understanding.    Wallis Bamberg, [place] 10/02/20 1523

## 2020-10-03 ENCOUNTER — Telehealth (HOSPITAL_COMMUNITY): Payer: Self-pay | Admitting: Emergency Medicine

## 2020-10-03 LAB — CERVICOVAGINAL ANCILLARY ONLY
Bacterial Vaginitis (gardnerella): POSITIVE — AB
Candida Glabrata: NEGATIVE
Candida Vaginitis: POSITIVE — AB
Chlamydia: NEGATIVE
Comment: NEGATIVE
Comment: NEGATIVE
Comment: NEGATIVE
Comment: NEGATIVE
Comment: NEGATIVE
Comment: NORMAL
Neisseria Gonorrhea: NEGATIVE
Trichomonas: NEGATIVE

## 2020-10-03 MED ORDER — TERCONAZOLE 0.4 % VA CREA: 1.0000 | TOPICAL_CREAM | Freq: Every day | VAGINAL | 0 refills | Status: DC

## 2020-10-03 MED ORDER — METRONIDAZOLE 0.75 % VA GEL: 1.0000 | Freq: Every day | VAGINAL | 0 refills | Status: DC

## 2020-10-05 ENCOUNTER — Encounter (HOSPITAL_COMMUNITY): Payer: Self-pay | Admitting: Obstetrics and Gynecology

## 2020-10-05 ENCOUNTER — Inpatient Hospital Stay (HOSPITAL_COMMUNITY)
Admission: EM | Admit: 2020-10-05 | Discharge: 2020-10-06 | Disposition: A | Discharge status: Home / Self Care | Payer: Medicaid Other | Attending: Obstetrics and Gynecology | Admitting: Obstetrics and Gynecology

## 2020-10-05 ENCOUNTER — Other Ambulatory Visit: Payer: Self-pay

## 2020-10-05 DIAGNOSIS — O418X10 Other specified disorders of amniotic fluid and membranes, first trimester, not applicable or unspecified: Secondary | ICD-10-CM

## 2020-10-05 DIAGNOSIS — Z6790 Unspecified blood type, Rh positive: Secondary | ICD-10-CM

## 2020-10-05 DIAGNOSIS — F32A Depression, unspecified: Secondary | ICD-10-CM | POA: Insufficient documentation

## 2020-10-05 DIAGNOSIS — Z87891 Personal history of nicotine dependence: Secondary | ICD-10-CM | POA: Insufficient documentation

## 2020-10-05 DIAGNOSIS — O99341 Other mental disorders complicating pregnancy, first trimester: Secondary | ICD-10-CM | POA: Diagnosis not present

## 2020-10-05 DIAGNOSIS — Z79899 Other long term (current) drug therapy: Secondary | ICD-10-CM | POA: Insufficient documentation

## 2020-10-05 DIAGNOSIS — O99511 Diseases of the respiratory system complicating pregnancy, first trimester: Secondary | ICD-10-CM | POA: Diagnosis not present

## 2020-10-05 DIAGNOSIS — O458X1 Other premature separation of placenta, first trimester: Secondary | ICD-10-CM | POA: Diagnosis not present

## 2020-10-05 DIAGNOSIS — O4691 Antepartum hemorrhage, unspecified, first trimester: Secondary | ICD-10-CM | POA: Diagnosis not present

## 2020-10-05 DIAGNOSIS — O208 Other hemorrhage in early pregnancy: Secondary | ICD-10-CM | POA: Diagnosis not present

## 2020-10-05 DIAGNOSIS — J45909 Unspecified asthma, uncomplicated: Secondary | ICD-10-CM | POA: Diagnosis not present

## 2020-10-05 DIAGNOSIS — K5909 Other constipation: Secondary | ICD-10-CM

## 2020-10-05 DIAGNOSIS — Z3A09 9 weeks gestation of pregnancy: Secondary | ICD-10-CM | POA: Insufficient documentation

## 2020-10-05 DIAGNOSIS — O4690 Antepartum hemorrhage, unspecified, unspecified trimester: Secondary | ICD-10-CM

## 2020-10-05 LAB — ABO/RH: ABO/RH(D): B POS

## 2020-10-05 LAB — CBC
HCT: 37.2 % (ref 36.0–46.0)
Hemoglobin: 12.8 g/dL (ref 12.0–15.0)
MCH: 27.4 pg (ref 26.0–34.0)
MCHC: 34.4 g/dL (ref 30.0–36.0)
MCV: 79.5 fL — ABNORMAL LOW (ref 80.0–100.0)
Platelets: 190 10*3/uL (ref 150–400)
RBC: 4.68 MIL/uL (ref 3.87–5.11)
RDW: 13.8 % (ref 11.5–15.5)
WBC: 6.3 10*3/uL (ref 4.0–10.5)
nRBC: 0 % (ref 0.0–0.2)

## 2020-10-05 LAB — I-STAT BETA HCG BLOOD, ED (MC, WL, AP ONLY): I-stat hCG, quantitative: >2000 m[IU]/mL — ABNORMAL HIGH (ref <5)

## 2020-10-05 NOTE — ED Triage Notes (Signed)
Emergency Medicine Provider OB Triage Evaluation Note  Tracy Guzman is a 25 y.o. female, G1P0, at [redacted]w[redacted]d gestation who presents to the emergency department with complaints of vaginal bleeding.  Briefly this is a 25 year old female presenting with abdominal pain and vaginal bleeding.  Patient states she has had generalized aching abdominal pain x3 days.  She is currently being treated for BV and yeast infection with topical medication.  Vaginal bleeding started tonight.  She describes vaginal bleeding as light.  She called her OB triage line and they recommended she be seen in the emergency room.  Review of  Systems  Positive: vaginal bleeding, abdominal pain Negative: fever, chills  Physical Exam  BP 126/83 (BP Location: Right Arm)   Pulse 91   Temp 98.3 F (36.8 C) (Oral)   Resp 18   LMP 08/02/2020 (Exact Date)   SpO2 99%  General: Awake, no distress  HEENT: Atraumatic  Resp: Normal effort  Cardiac: Normal rate Abd: Nondistended, nontender  MSK: Moves all extremities without difficulty Neuro: Speech clear  Medical Decision Making  Pt evaluated for pregnancy concern and is stable for transfer to MAU. Pt is in agreement with plan for transfer.  10:47 PM Discussed with MAU APP, Shawna Orleans, who accepts patient in transfer.  Clinical Impression  No diagnosis found.     Shanon Ace, PA-C 10/05/20 2247

## 2020-10-05 NOTE — ED Notes (Signed)
Report called to MAU charge 

## 2020-10-06 ENCOUNTER — Inpatient Hospital Stay (HOSPITAL_COMMUNITY): Payer: Medicaid Other

## 2020-10-06 DIAGNOSIS — O458X1 Other premature separation of placenta, first trimester: Secondary | ICD-10-CM

## 2020-10-06 DIAGNOSIS — O4691 Antepartum hemorrhage, unspecified, first trimester: Secondary | ICD-10-CM

## 2020-10-06 DIAGNOSIS — Z3A09 9 weeks gestation of pregnancy: Secondary | ICD-10-CM

## 2020-10-06 LAB — URINALYSIS, ROUTINE W REFLEX MICROSCOPIC
Bilirubin Urine: NEGATIVE
Glucose, UA: NEGATIVE mg/dL
Hgb urine dipstick: NEGATIVE
Ketones, ur: NEGATIVE mg/dL
Leukocytes,Ua: NEGATIVE
Nitrite: NEGATIVE
Protein, ur: NEGATIVE mg/dL
Specific Gravity, Urine: 1.02 (ref 1.005–1.030)
pH: 5 (ref 5.0–8.0)

## 2020-10-06 LAB — HCG, QUANTITATIVE, PREGNANCY: hCG, Beta Chain, Quant, S: 182233 m[IU]/mL — ABNORMAL HIGH (ref <5)

## 2020-10-06 NOTE — MAU Provider Note (Signed)
History     CSN: [id number]  Arrival date and time: 10/05/20 2216   Event Date/Time   First Provider Initiated Contact with Patient 10/05/20 2352      Chief Complaint  Patient presents with  . Abdominal Pain  . Vaginal Bleeding   25 y.o. [id number] @[redacted]w[redacted]d  by sure LMP presenting with abdominal pain and spotting. Abdominal pain started 3 days ago. Describes as intermittent and cramping. Rates 4/10. She also noticed spotting tonight. After removing an applicator of Terazol she saw blood on it. She also inserted Metrogel tonight. She also c/o constipation and no BM for 2 weeks. She is using stool softeners and Miralax. Denies fevers and N/V.    OB History    Gravida  3   Para  2   Term  2   Preterm      AB      Living  2     SAB      IAB      Ectopic      Multiple      Live Births              Past Medical History:  Diagnosis Date  . Allergy    Smoke, dust, Pollen  . Asthma   . Asthma   . Depression   . Endometriosis   . GERD (gastroesophageal reflux disease)     Past Surgical History:  Procedure Laterality Date  . MYRINGOTOMY    . TONSILLECTOMY AND ADENOIDECTOMY  as infant  . WRIST SURGERY  age 51    Family History  Problem Relation Age of Onset  . Hypertension Mother   . Hypertension Father   . Depression Mother   . Hyperlipidemia Mother   . Depression Maternal Grandmother   . Drug abuse Cousin   . Drug abuse Maternal Uncle   . Hyperlipidemia Father     Social History   Tobacco Use  . Smoking status: Former Smoker    Packs/day: 1.00    Years: 1.00    Pack years: 1.00    Types: Cigarettes  . Smokeless tobacco: Never Used  Substance Use Topics  . Alcohol use: Never    Comment: occasional  . Drug use: Not Currently    Types: Other-see comments, MDMA (Ecstacy)    Allergies:  Allergies  Allergen Reactions  . Lamotrigine Rash    Medications Prior to Admission  Medication Sig Dispense Refill Last Dose  . albuterol (VENTOLIN HFA)  108 (90 Base) MCG/ACT inhaler Inhale 1-2 puffs into the lungs every 6 (six) hours as needed for wheezing or shortness of breath. 18 g 0 Past Month at Unknown time  . metroNIDAZOLE (METROGEL VAGINAL) 0.75 % vaginal gel Place 1 Applicatorful vaginally at bedtime for 5 days. 50 g 0 10/05/2020 at Unknown time  . terconazole (TERAZOL 7) 0.4 % vaginal cream Place 1 applicator vaginally at bedtime. 45 g 0 10/05/2020 at Unknown time  . ARIPiprazole (ABILIFY) 5 MG tablet Take 1 tablet (5 mg total) by mouth at bedtime. (Patient not taking: No sig reported) 30 tablet 2   . metoCLOPramide (REGLAN) 10 MG tablet Take 1 tablet (10 mg total) by mouth every 6 (six) hours as needed for nausea or vomiting. (Patient not taking: Reported on 08/30/2020) 12 tablet 0   . naproxen (NAPROSYN) 375 MG tablet Take 1 tablet (375 mg total) by mouth 2 (two) times daily with a meal. 30 tablet 0   . nitrofurantoin, macrocrystal-monohydrate, (MACROBID) 100 MG capsule Take 1  capsule (100 mg total) by mouth 2 (two) times daily. 14 capsule 0   . omeprazole (PRILOSEC) 20 MG capsule Take 1 capsule (20 mg total) by mouth daily. 30 capsule 0   . ondansetron (ZOFRAN ODT) 4 MG disintegrating tablet Take 1 tablet (4 mg total) by mouth every 8 (eight) hours as needed for nausea or vomiting. 10 tablet 0   . ondansetron (ZOFRAN) 4 MG tablet Take 1-2 tablets (4-8 mg total) by mouth every 6 (six) hours. 20 tablet 0   . promethazine-dextromethorphan (PROMETHAZINE-DM) 6.25-15 MG/5ML syrup Take 5 mLs by mouth 4 (four) times daily as needed for cough. (Patient not taking: Reported on 08/30/2020) 100 mL 0   . sucralfate (CARAFATE) 1 g tablet Take 1 tablet (1 g total) by mouth 4 (four) times daily -  with meals and at bedtime. 30 tablet 0   . venlafaxine (EFFEXOR) 75 MG tablet Take 1 tablet (75 mg total) by mouth daily with breakfast. (Patient not taking: No sig reported) 30 tablet 2     Review of Systems  Constitutional: Negative for chills and fever.   Gastrointestinal: Positive for abdominal pain and constipation. Negative for diarrhea, nausea and vomiting.  Genitourinary: Positive for vaginal bleeding. Negative for dysuria, frequency, hematuria, urgency and vaginal discharge.  Musculoskeletal: Positive for back pain.   Physical Exam   Blood pressure (!) 110/52, pulse 71, temperature 98.8 F (37.1 C), resp. rate 16, height 5\' 4"  (1.626 m), weight 65.8 kg, last menstrual period 08/02/2020, SpO2 100 %.  Physical Exam Constitutional:      General: She is not in acute distress.    Appearance: Normal appearance.  HENT:     Head: Normocephalic and atraumatic.  Cardiovascular:     Rate and Rhythm: Normal rate.  Pulmonary:     Effort: Pulmonary effort is normal. No respiratory distress.  Abdominal:     General: There is no distension.     Palpations: Abdomen is soft. There is no mass.     Tenderness: There is no abdominal tenderness. There is no guarding or rebound.     Hernia: No hernia is present.  Genitourinary:    Comments: External: no lesions or erythema Vagina: rugated, pink, moist, thick white discharge Uterus: + enlarged, anteverted, non tender, no CMT Adnexae: no masses, no tenderness left, no tenderness right Cervix closed  Musculoskeletal:        General: Normal range of motion.     Cervical back: Normal range of motion.  Skin:    General: Skin is warm and dry.  Neurological:     General: No focal deficit present.     Mental Status: She is alert and oriented to person, place, and time.  Psychiatric:        Mood and Affect: Mood normal.        Behavior: Behavior normal.    Results for orders placed or performed during the hospital encounter of 10/05/20 (from the past 24 hour(s))  I-Stat Beta hCG blood, ED ([facility], [facility], [facility] only)     Status: Abnormal   Collection Time: 10/05/20 10:34 PM  Result Value Ref Range   I-stat hCG, quantitative >2,000.0 (H) <5 mIU/mL   Comment 3          ABO/Rh     Status: None    Collection Time: 10/05/20 11:32 PM  Result Value Ref Range   ABO/RH(D) B POS    No rh immune globuloin      NOT A RH IMMUNE GLOBULIN  CANDIDATE, PT RH POSITIVE Performed at [facility], [address], [place], [place] [address]   CBC     Status: Abnormal   Collection Time: 10/05/20 11:32 PM  Result Value Ref Range   WBC 6.3 4.0 - 10.5 K/uL   RBC 4.68 3.87 - 5.11 MIL/uL   Hemoglobin 12.8 12.0 - 15.0 g/dL   HCT 44.9 67.5 - 91.6 %   MCV 79.5 (L) 80.0 - 100.0 fL   MCH 27.4 26.0 - 34.0 pg   MCHC 34.4 30.0 - 36.0 g/dL   RDW 38.4 66.5 - 99.3 %   Platelets 190 150 - 400 K/uL   nRBC 0.0 0.0 - 0.2 %  Urinalysis, Routine w reflex microscopic Urine, Clean Catch     Status: Abnormal   Collection Time: 10/05/20 11:45 PM  Result Value Ref Range   Color, Urine YELLOW YELLOW   APPearance CLOUDY (A) CLEAR   Specific Gravity, Urine 1.020 1.005 - 1.030   pH 5.0 5.0 - 8.0   Glucose, UA NEGATIVE NEGATIVE mg/dL   Hgb urine dipstick NEGATIVE NEGATIVE   Bilirubin Urine NEGATIVE NEGATIVE   Ketones, ur NEGATIVE NEGATIVE mg/dL   Protein, ur NEGATIVE NEGATIVE mg/dL   Nitrite NEGATIVE NEGATIVE   Leukocytes,Ua NEGATIVE NEGATIVE   US OB Comp Less 14 Wks  Result Date: 10/06/2020 CLINICAL DATA:  Vaginal bleeding EXAM: OBSTETRIC <14 WK ULTRASOUND TECHNIQUE: Transabdominal ultrasound was performed for evaluation of the gestation as well as the maternal uterus and adnexal regions. COMPARISON:  None. FINDINGS: Intrauterine gestational sac: Present Yolk sac:  Present Embryo:  Present Cardiac Activity: Present Heart Rate: 177 bpm CRL:   25.7 mm   9 w 2 d                  [place] EDC: 05/09/2021 Subchorionic hemorrhage:  Small subchorionic hemorrhage is noted. Maternal uterus/adnexae: Ovaries are well visualized and within normal limits. IMPRESSION: Single live intrauterine gestation at 9 weeks 2 days. Small subchorionic hemorrhage is noted. Electronically Signed   By: Alcide Clever M.D.   On: 10/06/2020 00:29   MAU  Course  Procedures  MDM Labs and [place] ordered and reviewed. Viable IUP on [place] with small SCH, may explain spotting. Discussed findings with pt along with precautions. Stable for discharge home.  Assessment and Plan   1. [redacted] weeks gestation of pregnancy   2. Vaginal bleeding in pregnancy   3. Blood type, Rh positive   4. Subchorionic hematoma in first trimester, single or unspecified fetus    Discharge home Follow [facility] as scheduled Pelvic rest SAB/bleeding precautions Miralax clean out regimen  Allergies as of 10/06/2020      Reactions   Lamotrigine Rash      Medication List    STOP taking these medications   ARIPiprazole 5 MG tablet Commonly known as: ABILIFY   metoCLOPramide 10 MG tablet Commonly known as: Reglan   naproxen 375 MG tablet Commonly known as: NAPROSYN   nitrofurantoin (macrocrystal-monohydrate) 100 MG capsule Commonly known as: MACROBID   omeprazole 20 MG capsule Commonly known as: PRILOSEC   ondansetron 4 MG disintegrating tablet Commonly known as: Zofran ODT   ondansetron 4 MG tablet Commonly known as: ZOFRAN   promethazine-dextromethorphan 6.25-15 MG/5ML syrup Commonly known as: PROMETHAZINE-DM   sucralfate 1 g tablet Commonly known as: Carafate   venlafaxine 75 MG tablet Commonly known as: EFFEXOR     TAKE these medications   albuterol 108 (90 Base) MCG/ACT inhaler Commonly  known as: VENTOLIN HFA Inhale 1-2 puffs into the lungs every 6 (six) hours as needed for wheezing or shortness of breath.   metroNIDAZOLE 0.75 % vaginal gel Commonly known as: METROGEL VAGINAL Place 1 Applicatorful vaginally at bedtime for 5 days.   terconazole 0.4 % vaginal cream Commonly known as: TERAZOL 7 Place 1 applicator vaginally at bedtime.      Donette Larry, CNM 10/06/2020, 12:42 AM

## 2020-10-06 NOTE — Discharge Instructions (Signed)
Subchorionic Hematoma  A hematoma is a collection of blood outside of the blood vessels. A subchorionic hematoma is a collection of blood between the outer wall of the embryo (chorion) and the inner wall of the uterus. This condition can cause vaginal bleeding. Early small hematomas usually shrink on their own and do not affect your baby or pregnancy. When bleeding starts later in pregnancy, or if the hematoma is larger or occurs in older pregnant women, the condition may be more serious. Larger hematomas increase the chances of miscarriage. This condition also increases the risk of:  Premature separation of the placenta from the uterus.  Premature (preterm) labor.  Stillbirth. What are the causes? The exact cause of this condition is not known. It occurs when blood is trapped between the placenta and the uterine wall because the placenta has separated from the original site of implantation. What increases the risk? You are more likely to develop this condition if:  You were treated with fertility medicines.  You became pregnant through in vitro fertilization (IVF). What are the signs or symptoms? Symptoms of this condition include:  Vaginal spotting or bleeding.  Abdominal pain. This is rare. Sometimes you may have no symptoms and the bleeding may only be seen when ultrasound images are taken (transvaginal ultrasound). How is this diagnosed? This condition is diagnosed based on a physical exam. This includes a pelvic exam. You may also have other tests, including:  Blood tests.  Urine tests.  Ultrasound of the abdomen. How is this treated? Treatment for this condition can vary. Treatment may include:  Watchful waiting. You will be monitored closely for any changes in bleeding.  Medicines.  Activity restriction. This may be needed until the bleeding stops.  A medicine called Rh immunoglobulin. This is given if you have an Rh-negative blood type. It prevents Rh  sensitization. Follow these instructions at home:  Stay on bed rest if told to do so by your health care provider.  Do not lift anything that is heavier than 10 lb (4.5 kg), or the limit that you are told by your health care provider.  Track and write down the number of pads you use each day and how soaked (saturated) they are.  Do not use tampons.  Keep all follow-up visits. This is important. Your health care provider may ask you to have follow-up blood tests or ultrasound tests or both. Contact a health care provider if:  You have any vaginal bleeding.  You have a fever. Get help right away if:  You have severe cramps in your stomach, back, abdomen, or pelvis.  You pass large clots or tissue. Save any tissue for your health care provider to look at.  You faint.  You become light-headed or weak. Summary  A subchorionic hematoma is a collection of blood between the outer wall of the embryo (chorion) and the inner wall of the uterus.  This condition can cause vaginal bleeding.  Sometimes you may have no symptoms and the bleeding may only be seen when ultrasound images are taken.  Treatment may include watchful waiting, medicines, or activity restriction.  Keep all follow-up visits. Get help right away if you have severe cramps or heavy vaginal bleeding. This information is not intended to replace advice given to you by your health care provider. Make sure you discuss any questions you have with your health care provider. Document Revised: 05/20/2020 Document Reviewed: 05/20/2020 Elsevier Patient Education  2021 Elsevier Inc.   Abdominal Pain During Pregnancy Belly (  abdominal) pain is common during pregnancy. There are many possible causes. Some causes are more serious than others. Sometimes the cause is not known. Always tell your doctor if you have belly pain. Follow these instructions at home:  Do not have sex or put anything in your vagina until your pain goes away  completely.  Get plenty of rest until your pain gets better.  Drink enough fluid to keep your pee (urine) pale yellow.  Take over-the-counter and prescription medicines only as told by your doctor.  Keep all follow-up visits.   Contact a doctor if:  You keep having pain after resting.  Your pain gets worse after resting.  You have lower belly pain that: ? Comes and goes at regular times. ? Spreads to your back. ? Feels like menstrual cramps.  You have pain or burning when you pee (urinate). Get help right away if:  You have a fever or chills.  You feel like it is hard to breathe.  You have bleeding from your vagina.  You are leaking fluid or tissue from your vagina.  You vomit for more than 24 hours.  You have watery poop (diarrhea) for more than 24 hours.  Your baby is moving less than usual.  You feel very weak or faint.  You have very bad pain in your upper belly. Summary  Belly pain is common during pregnancy. There are many possible causes.  If you have belly pain during pregnancy, tell your doctor right away.  Keep all follow-up visits. This information is not intended to replace advice given to you by your health care provider. Make sure you discuss any questions you have with your health care provider. Document Revised: 05/07/2020 Document Reviewed: 05/07/2020 Elsevier Patient Education  2021 Elsevier Inc.   Constipation, Adult Constipation is when a person has trouble pooping (having a bowel movement). When you have this condition, you may poop fewer than 3 times a week. Your poop (stool) may also be dry, hard, or bigger than normal. Follow these instructions at home: Eating and drinking  Eat foods that have a lot of fiber, such as: ? Fresh fruits and vegetables. ? Whole grains. ? Beans.  Eat less of foods that are low in fiber and high in fat and sugar, such as: ? [place] fries. ? Hamburgers. ? Cookies. ? Candy. ? Soda.  Drink enough  fluid to keep your pee (urine) pale yellow.   General instructions  Exercise regularly or as told by your doctor. Try to do 150 minutes of exercise each week.  Go to the restroom when you feel like you need to poop. Do not hold it in.  Take over-the-counter and prescription medicines only as told by your doctor. These include any fiber supplements.  When you poop: ? Do deep breathing while relaxing your lower belly (abdomen). ? Relax your pelvic floor. The pelvic floor is a group of muscles that support the rectum, bladder, and intestines (as well as the uterus in women).  Watch your condition for any changes. Tell your doctor if you notice any.  Keep all follow-up visits as told by your doctor. This is important. Contact a doctor if:  You have pain that gets worse.  You have a fever.  You have not pooped for 4 days.  You vomit.  You are not hungry.  You lose weight.  You are bleeding from the opening of the butt (anus).  You have thin, pencil-like poop. Get help right away if:  You  have a fever, and your symptoms suddenly get worse.  You leak poop or have blood in your poop.  Your belly feels hard or bigger than normal (bloated).  You have very bad belly pain.  You feel dizzy or you faint. Summary  Constipation is when a person poops fewer than 3 times a week, has trouble pooping, or has poop that is dry, hard, or bigger than normal.  Eat foods that have a lot of fiber.  Drink enough fluid to keep your pee (urine) pale yellow.  Take over-the-counter and prescription medicines only as told by your doctor. These include any fiber supplements. This information is not intended to replace advice given to you by your health care provider. Make sure you discuss any questions you have with your health care provider. Document Revised: 07/12/2019 Document Reviewed: 07/12/2019 Elsevier Patient Education  2021 Elsevier Inc.  You have constipation which is hard stools  that are difficult to pass. It is important to have regular bowel movements every 1-3 days that are soft and easy to pass. Hard stools increase your risk of hemorrhoids and are very uncomfortable.   To prevent constipation you can increase the amount of fiber in your diet. Examples of foods with fiber are leafy greens, whole grain breads, oatmeal and other grains.  It is also important to drink at least eight 8oz glass of water everyday.   If you have not has a bowel movement in 4-5 days you made need to clean out your bowel.  This will have establish normal movement through your bowel.    Miralax Clean out  Take 8 capfuls of miralax in 64 oz of gatorade. You can use any fluid that appeals to you (gatorade, water, juice)  Continue to drink at least eight 8 oz glasses of water throughout the day  You can repeat with another 8 capfuls of miralax in 64 oz of gatorade if you are not having a large amount of stools  You will need to be at home and close to a bathroom for about 8 hours when you do the above as you may need to go to the bathroom frequently.   After you are cleaned out: - Start Colace100mg  twice daily - Start Miralax once daily - Start a daily fiber supplement like metamucil or citrucel - You can safely use enemas in pregnancy  - if you are having diarrhea you can reduce to Colace once a day or miralax every other day or a 1/2 capful daily.

## 2020-10-16 ENCOUNTER — Ambulatory Visit (INDEPENDENT_AMBULATORY_CARE_PROVIDER_SITE_OTHER): Payer: Medicaid Other | Admitting: *Deleted

## 2020-10-16 DIAGNOSIS — Z3480 Encounter for supervision of other normal pregnancy, unspecified trimester: Secondary | ICD-10-CM | POA: Insufficient documentation

## 2020-10-16 DIAGNOSIS — Z3A00 Weeks of gestation of pregnancy not specified: Secondary | ICD-10-CM

## 2020-10-16 NOTE — Progress Notes (Addendum)
I connected with  Tracy Guzman on 10/24/20 by a video enabled telemedicine application and verified that I am speaking with the correct person using two identifiers.   I discussed the limitations of evaluation and management by telemedicine. The patient expressed understanding and agreed to proceed.   PRENATAL INTAKE SUMMARY  Tracy Guzman presents today New OB Nurse Interview. Pt is remote and provider in office.   OB History    Gravida  3   Para  2   Term  2   Preterm      AB      Living  2     SAB      IAB      Ectopic      Multiple      Live Births             I have reviewed the patient's medical, obstetrical, social, and family histories, medications, and available lab results.  SUBJECTIVE She has complaints of Constipation  OBJECTIVE Initial Physical Exam (New OB)  GENERAL APPEARANCE: sounds well via Televisit   ASSESSMENT Normal pregnancy  PLAN Prenatal care at [facility]  Continue Miralax, increase fluids and fiber intake.

## 2020-10-18 NOTE — Progress Notes (Signed)
I have reviewed this chart and agree with the RN/CMA assessment and management.    K. Meryl Aarush Stukey, MD, FACOG Attending [facility] (Faculty [facility])  

## 2020-10-22 ENCOUNTER — Ambulatory Visit (INDEPENDENT_AMBULATORY_CARE_PROVIDER_SITE_OTHER): Payer: Medicaid Other | Admitting: Obstetrics and Gynecology

## 2020-10-22 ENCOUNTER — Other Ambulatory Visit: Payer: Self-pay

## 2020-10-22 ENCOUNTER — Encounter: Payer: Self-pay | Admitting: Obstetrics and Gynecology

## 2020-10-22 ENCOUNTER — Other Ambulatory Visit (HOSPITAL_COMMUNITY)
Admission: RE | Admit: 2020-10-22 | Discharge: 2020-10-22 | Disposition: A | Discharge status: Home / Self Care | Payer: Medicaid Other | Source: Ambulatory Visit

## 2020-10-22 VITALS — Wt 135.7 lb

## 2020-10-22 DIAGNOSIS — F913 Oppositional defiant disorder: Secondary | ICD-10-CM

## 2020-10-22 DIAGNOSIS — Z3480 Encounter for supervision of other normal pregnancy, unspecified trimester: Secondary | ICD-10-CM

## 2020-10-22 DIAGNOSIS — O09892 Supervision of other high risk pregnancies, second trimester: Secondary | ICD-10-CM | POA: Insufficient documentation

## 2020-10-22 DIAGNOSIS — F1910 Other psychoactive substance abuse, uncomplicated: Secondary | ICD-10-CM

## 2020-10-22 DIAGNOSIS — O09291 Supervision of pregnancy with other poor reproductive or obstetric history, first trimester: Secondary | ICD-10-CM

## 2020-10-22 DIAGNOSIS — K219 Gastro-esophageal reflux disease without esophagitis: Secondary | ICD-10-CM

## 2020-10-22 DIAGNOSIS — O09292 Supervision of pregnancy with other poor reproductive or obstetric history, second trimester: Secondary | ICD-10-CM | POA: Insufficient documentation

## 2020-10-22 DIAGNOSIS — O09891 Supervision of other high risk pregnancies, first trimester: Secondary | ICD-10-CM

## 2020-10-22 DIAGNOSIS — O09893 Supervision of other high risk pregnancies, third trimester: Secondary | ICD-10-CM | POA: Insufficient documentation

## 2020-10-22 DIAGNOSIS — F4310 Post-traumatic stress disorder, unspecified: Secondary | ICD-10-CM

## 2020-10-22 DIAGNOSIS — Z3A11 11 weeks gestation of pregnancy: Secondary | ICD-10-CM | POA: Insufficient documentation

## 2020-10-22 DIAGNOSIS — F331 Major depressive disorder, recurrent, moderate: Secondary | ICD-10-CM

## 2020-10-22 MED ORDER — BLOOD PRESSURE MONITOR DEVI: 0 refills | Status: DC

## 2020-10-22 NOTE — Progress Notes (Signed)
INITIAL PRENATAL VISIT NOTE  Subjective:  Tracy Guzman is a 25 y.o. G3P1102 at [redacted]w[redacted]d by LMP c/w 9 week u/s being seen today for her initial prenatal visit. 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 She has a medical history significant for endometriosis, depression, asthma and GERD.  The patient states she also had polyhydramnios in the last pregnancy which they attributed to the TES.  A full diagnosis has yet to be rendered.  Patient reports no complaints.  Contractions: Irritability. Vag. Bleeding: None.   . Denies leaking of fluid.    Past Medical History:  Diagnosis Date  . Allergy    Smoke, dust, Pollen  . Asthma   . Asthma   . Depression   . Endometriosis   . GERD (gastroesophageal reflux disease)     Past Surgical History:  Procedure Laterality Date  . MYRINGOTOMY    . TONSILLECTOMY AND ADENOIDECTOMY  as infant  . WRIST SURGERY  age 68    OB History  Gravida Para Term Preterm AB Living  3 2 1 1   2   SAB IAB Ectopic Multiple Live Births          2    # Outcome Date GA Lbr Len/2nd Weight Sex Delivery Anes PTL Lv  3 Current           2 Preterm 04/23/16 [redacted]w[redacted]d   M Vag-Spont   LIV  1 Term 06/01/15 [redacted]w[redacted]d   F Vag-Spont   LIV    Social History   Socioeconomic History  . Marital status: Single    Spouse name: Not on file  . Number of children: Not on file  . Years of education: Not on file  . Highest education level: Not on file  Occupational History  . Occupation: [redacted]w[redacted]d: UNEMPLOYED    Comment: Home schooled  Tobacco Use  . Smoking status: Former Smoker    Packs/day: 1.00    Years: 1.00    Pack years: 1.00    Types: Cigarettes  . Smokeless tobacco: Never Used  Vaping Use  . Vaping Use: Never used  Substance and Sexual Activity  . Alcohol use: Not Currently    Comment: not since confirmed pregnancy  . Drug  use: Not Currently    Types: Other-see comments, MDMA (Ecstacy), Marijuana    Comment: last used end of December  . Sexual activity: Yes    Partners: Male    Birth control/protection: None  Other Topics Concern  . Not on file  Social History Narrative   ** Merged History Encounter **       Social Determinants of Health   Financial Resource Strain: Not on file  Food Insecurity: Not on file  Transportation Needs: Not on file  Physical Activity: Not on file  Stress: Not on file  Social Connections: Not on file    Family History  Problem Relation Age of Onset  . Hypertension Mother   . Hypertension Father   . Depression Mother   . Hyperlipidemia Mother   . Depression Maternal Grandmother   . Drug abuse Cousin   . Drug abuse Maternal Uncle   . Hyperlipidemia Father      Current Outpatient Medications:  .  albuterol (VENTOLIN HFA) 108 (90 Base) MCG/ACT inhaler, Inhale 1-2 puffs into the lungs every 6 (six) hours as needed for wheezing or shortness of breath., Disp:  18 g, Rfl: 0 .  Blood Pressure Monitor DEVI, Please check blood pressure 1-2 per week, Disp: 1 each, Rfl: 0 .  prenatal vitamin w/FE, FA (PRENATAL 1 + 1) 27-1 MG TABS tablet, Take 1 tablet by mouth daily at 12 noon., Disp: , Rfl:   Allergies  Allergen Reactions  . Lamotrigine Rash    Review of Systems: Negative except for what is mentioned in HPI.  Objective:   Vitals:   10/22/20 0954  Weight: 135 lb 11.2 oz (61.6 kg)    Fetal Status: Fetal Heart Rate (bpm): 172         Physical Exam: Wt 135 lb 11.2 oz (61.6 kg)   LMP 08/02/2020 (Exact Date)   BMI 23.29 kg/m  CONSTITUTIONAL: Well-developed, well-nourished female in no acute distress.  NEUROLOGIC: Alert and oriented to person, place, and time. Normal reflexes, muscle tone coordination. No cranial nerve deficit noted. PSYCHIATRIC: Normal mood and affect. Normal behavior. Normal judgment and thought content. SKIN: Skin is warm and dry. No rash noted.  Not diaphoretic. No erythema. No pallor. HENT:  Normocephalic, atraumatic, External right and left ear normal. Oropharynx is clear and moist EYES: Conjunctivae and EOM are normal. Pupils are equal, round, and reactive to light. No scleral icterus.  NECK: Normal range of motion, supple, no masses CARDIOVASCULAR: Normal heart rate noted, regular rhythm RESPIRATORY: Effort and breath sounds normal, no problems with respiration noted BREASTS: symmetric, non-tender, no masses palpable, bilateral inverted nipples ABDOMEN: Soft, nontender, nondistended, gravid. GU: normal appearing external female genitalia, normal appearing cervix, scant white discharge in vagina, no lesions noted, pap taken without incident Bimanual: 10 weeks sized uterus, no adnexal tenderness or palpable lesions noted MUSCULOSKELETAL: Normal range of motion. EXT:  No edema and no tenderness. 2+ distal pulses.   Assessment and Plan:  Pregnancy: G3P1102 at [redacted]w[redacted]d by LMP  1. Supervision of other normal pregnancy, antepartum Routine labs for today, schedule for anatomy scan at next visit - Culture, OB Urine - CBC/D/Plt+RPR+Rh+ABO+Rub Ab... - Cytology - PAP( [place]) - Babyscripts Schedule Optimization - Blood Pressure Monitor DEVI; Please check blood pressure 1-2 per week  Dispense: 1 each; Refill: 0 - Genetic Screening  2. [redacted] weeks gestation of pregnancy   3. Gastroesophageal reflux disease without esophagitis   4. Polysubstance abuse (HCC)   5. Oppositional defiant disorder   6. PTSD (post-traumatic stress disorder)   7. MDD (major depressive disorder), recurrent episode, moderate (HCC)   8. History of preterm delivery, currently pregnant in first trimester Pt will be valuated with MFM, hold makena for now  9. H/O fetal anomaly in prior pregnancy, currently pregnant, first trimester  - AMB referral to maternal fetal medicine   Preterm labor symptoms and general obstetric precautions including but not  limited to vaginal bleeding, contractions, leaking of fluid and fetal movement were reviewed in detail with the patient.  Please refer to After Visit Summary for other counseling recommendations.   Return in about 4 weeks (around 11/19/2020) for [facility], in person.  Warden Fillers 10/22/2020 10:39 AM

## 2020-10-22 NOTE — Addendum Note (Signed)
Addended by: Hamilton Capri on: 10/22/2020 10:56 AM   Modules accepted: Orders

## 2020-10-22 NOTE — Progress Notes (Signed)
Patient presents for Initial Prenatal visit. Patient has no concerns today. Patient declines flu vaccines at this time.

## 2020-10-23 LAB — CBC/D/PLT+RPR+RH+ABO+RUB AB...
Antibody Screen: NEGATIVE
Basophils Absolute: 0 10*3/uL (ref 0.0–0.2)
Basos: 0 %
EOS (ABSOLUTE): 0 10*3/uL (ref 0.0–0.4)
Eos: 0 %
HCV Ab: <0.1 s/co ratio (ref 0.0–0.9)
HIV Screen 4th Generation wRfx: NONREACTIVE
Hematocrit: 39 % (ref 34.0–46.6)
Hemoglobin: 13.6 g/dL (ref 11.1–15.9)
Hepatitis B Surface Ag: NEGATIVE
Immature Grans (Abs): 0 10*3/uL (ref 0.0–0.1)
Immature Granulocytes: 0 %
Lymphocytes Absolute: 1.7 10*3/uL (ref 0.7–3.1)
Lymphs: 22 %
MCH: 27.9 pg (ref 26.6–33.0)
MCHC: 34.9 g/dL (ref 31.5–35.7)
MCV: 80 fL (ref 79–97)
Monocytes Absolute: 0.4 10*3/uL (ref 0.1–0.9)
Monocytes: 5 %
Neutrophils Absolute: 5.6 10*3/uL (ref 1.4–7.0)
Neutrophils: 73 %
Platelets: 197 10*3/uL (ref 150–450)
RBC: 4.87 x10E6/uL (ref 3.77–5.28)
RDW: 14 % (ref 11.7–15.4)
RPR Ser Ql: NONREACTIVE
Rh Factor: POSITIVE
Rubella Antibodies, IGG: 1.01 index (ref >0.99)
WBC: 7.7 10*3/uL (ref 3.4–10.8)

## 2020-10-23 LAB — CYTOLOGY - PAP: Diagnosis: NEGATIVE

## 2020-10-23 LAB — HCV INTERPRETATION

## 2020-10-24 LAB — CULTURE, OB URINE

## 2020-10-24 LAB — URINE CULTURE, OB REFLEX

## 2020-10-29 ENCOUNTER — Encounter: Payer: Self-pay | Admitting: Obstetrics and Gynecology

## 2020-11-05 ENCOUNTER — Encounter: Payer: Self-pay | Admitting: Obstetrics and Gynecology

## 2020-11-19 ENCOUNTER — Other Ambulatory Visit: Payer: Self-pay

## 2020-11-19 ENCOUNTER — Other Ambulatory Visit (HOSPITAL_COMMUNITY)
Admission: RE | Admit: 2020-11-19 | Discharge: 2020-11-19 | Disposition: A | Discharge status: Home / Self Care | Payer: Medicaid Other | Source: Ambulatory Visit | Attending: Obstetrics and Gynecology | Admitting: Obstetrics and Gynecology

## 2020-11-19 ENCOUNTER — Encounter: Payer: Self-pay | Admitting: Obstetrics and Gynecology

## 2020-11-19 ENCOUNTER — Ambulatory Visit (INDEPENDENT_AMBULATORY_CARE_PROVIDER_SITE_OTHER): Payer: Medicaid Other | Admitting: Obstetrics and Gynecology

## 2020-11-19 VITALS — BP 107/67 | HR 69 | Wt 137.5 lb

## 2020-11-19 DIAGNOSIS — N898 Other specified noninflammatory disorders of vagina: Secondary | ICD-10-CM | POA: Diagnosis present

## 2020-11-19 DIAGNOSIS — O26892 Other specified pregnancy related conditions, second trimester: Secondary | ICD-10-CM | POA: Insufficient documentation

## 2020-11-19 DIAGNOSIS — M549 Dorsalgia, unspecified: Secondary | ICD-10-CM | POA: Insufficient documentation

## 2020-11-19 DIAGNOSIS — O99891 Other specified diseases and conditions complicating pregnancy: Secondary | ICD-10-CM

## 2020-11-19 DIAGNOSIS — F331 Major depressive disorder, recurrent, moderate: Secondary | ICD-10-CM

## 2020-11-19 DIAGNOSIS — F913 Oppositional defiant disorder: Secondary | ICD-10-CM

## 2020-11-19 DIAGNOSIS — F1910 Other psychoactive substance abuse, uncomplicated: Secondary | ICD-10-CM

## 2020-11-19 DIAGNOSIS — Z3A15 15 weeks gestation of pregnancy: Secondary | ICD-10-CM | POA: Insufficient documentation

## 2020-11-19 DIAGNOSIS — F4310 Post-traumatic stress disorder, unspecified: Secondary | ICD-10-CM

## 2020-11-19 DIAGNOSIS — O09892 Supervision of other high risk pregnancies, second trimester: Secondary | ICD-10-CM

## 2020-11-19 DIAGNOSIS — O26893 Other specified pregnancy related conditions, third trimester: Secondary | ICD-10-CM | POA: Insufficient documentation

## 2020-11-19 DIAGNOSIS — R519 Headache, unspecified: Secondary | ICD-10-CM | POA: Insufficient documentation

## 2020-11-19 DIAGNOSIS — Z3480 Encounter for supervision of other normal pregnancy, unspecified trimester: Secondary | ICD-10-CM

## 2020-11-19 MED ORDER — BUTALBITAL-APAP-CAFFEINE 50-325-40 MG PO CAPS
1.0000 | ORAL_CAPSULE | Freq: Four times a day (QID) | ORAL | 0 refills | Status: DC | PRN
Start: 2020-11-19 — End: 2021-05-06

## 2020-11-19 NOTE — Patient Instructions (Signed)
Back Pain in Pregnancy Back pain during pregnancy is common. Back pain may be caused by several factors that are related to changes during your pregnancy. Follow these instructions at home: Managing pain, stiffness, and swelling  If directed, for sudden (acute) back pain, put ice on the painful area. ? Put ice in a plastic bag. ? Place a towel between your skin and the bag. ? Leave the ice on for 20 minutes, 2-3 times per day.  If directed, apply heat to the affected area before you exercise. Use the heat source that your health care provider recommends, such as a moist heat pack or a heating pad. ? Place a towel between your skin and the heat source. ? Leave the heat on for 20-30 minutes. ? Remove the heat if your skin turns bright red. This is especially important if you are unable to feel pain, heat, or cold. You may have a greater risk of getting burned.  If directed, massage the affected area.      Activity  Exercise as told by your health care provider. Gentle exercise is the best way to prevent or manage back pain.  Listen to your body when lifting. If lifting hurts, ask for help or bend your knees. This uses your leg muscles instead of your back muscles.  Squat down when picking up something from the floor. Do not bend over.  Only use bed rest for short periods as told by your health care provider. Bed rest should only be used for the most severe episodes of back pain. Standing, sitting, and lying down  Do not stand in one place for long periods of time.  Use good posture when sitting. Make sure your head rests over your shoulders and is not hanging forward. Use a pillow on your lower back if necessary.  Try sleeping on your side, preferably the left side, with a pregnancy support pillow or 1-2 regular pillows between your legs. ? If you have back pain after a night's rest, your bed may be too soft. ? A firm mattress may provide more support for your back during  pregnancy. General instructions  Do not wear high heels.  Eat a healthy diet. Try to gain weight within your health care provider's recommendations.  Use a maternity girdle, elastic sling, or back brace as told by your health care provider.  Take over-the-counter and prescription medicines only as told by your health care provider.  Work with a physical therapist or massage therapist to find ways to manage back pain. Acupuncture or massage therapy may be helpful.  Keep all follow-up visits as told by your health care provider. This is important. Contact a health care provider if:  Your back pain interferes with your daily activities.  You have increasing pain in other parts of your body. Get help right away if:  You develop numbness, tingling, weakness, or problems with the use of your arms or legs.  You develop severe back pain that is not controlled with medicine.  You have a change in bowel or bladder control.  You develop shortness of breath, dizziness, or you faint.  You develop nausea, vomiting, or sweating.  You have back pain that is a rhythmic, cramping pain similar to labor pains. Labor pain is usually 1-2 minutes apart, lasts for about 1 minute, and involves a bearing down feeling or pressure in your pelvis.  You have back pain and your water breaks or you have vaginal bleeding.  You have back pain or   numbness that travels down your leg.  Your back pain developed after you fell.  You develop pain on one side of your back.  You see blood in your urine.  You develop skin blisters in the area of your back pain. Summary  Back pain may be caused by several factors that are related to changes during your pregnancy.  Follow instructions as told by your health care provider for managing pain, stiffness, and swelling.  Exercise as told by your health care provider. Gentle exercise is the best way to prevent or manage back pain.  Take over-the-counter and  prescription medicines only as told by your health care provider.  Keep all follow-up visits as told by your health care provider. This is important. This information is not intended to replace advice given to you by your health care provider. Make sure you discuss any questions you have with your health care provider. Document Revised: 12/13/2018 Document Reviewed: 02/09/2018 Elsevier Patient Education  2021 Elsevier Inc. Alpha-Fetoprotein Test Why am I having this test? The alpha-fetoprotein test is a lab test most commonly used for pregnant women to help screen for birth defects in their unborn baby. It can be used to screen for chromosome (DNA) abnormalities, problems with the brain or spinal cord, or problems with the abdominal wall of the unborn baby (fetus). The alpha-fetoprotein test may also be done for men or nonpregnant women to check for certain cancers. What is being tested? This test measures the amount of alpha-fetoprotein (AFP) in your blood. AFP is a protein that is made by the liver. Levels can be detected in the mother's blood during pregnancy, starting at 10 weeks and peaking at 16-18 weeks of the pregnancy. Abnormal levels can sometimes be a sign of a birth defect in the baby. Certain cancers can cause a high level of AFP in men and nonpregnant women. What kind of sample is taken? A blood sample is required for this test. It is usually collected by inserting a needle into a blood vessel.   How are the results reported? Your test results will be reported as values. Your health care provider will compare your results to normal ranges that were established after testing a large group of people (reference values). Reference values may vary among labs and hospitals. For this test, common reference values are:  Adult: Less than 40 ng/mL or less than 40 mcg/L (SI units).  Child younger than 1 year: Less than 30 ng/mL. If you are pregnant, the values may also vary based on how long  you have been pregnant. What do the results mean? Results that are above the reference values in pregnant women may indicate the following for the baby:  Neural tube defects, such as abnormalities of the spinal cord or brain.  Abdominal wall defects.  Multiple pregnancy such as twins.  Fetal distress or fetal death. Results that are above the reference values in men or nonpregnant women may indicate:  Reproductive cancers, such as ovarian or testicular cancer.  Liver cancer.  Liver cell death.  Other types of cancer. Very low levels of AFP in pregnant women may indicate Down syndrome for the baby. Talk with your health care provider about what your results mean. Questions to ask your health care provider Ask your health care provider, or the department that is doing the test:  When will my results be ready?  How will I get my results?  What are my treatment options?  What other tests do I need?  What are my next steps? Summary  The alpha-fetoprotein test is done on pregnant women to help screen for birth defects in their unborn baby.  Certain cancers can cause a high level of AFP in men and nonpregnant women.  For this test, a blood sample is usually collected by inserting a needle into a blood vessel.  Talk with your health care provider about what your results mean. This information is not intended to replace advice given to you by your health care provider. Make sure you discuss any questions you have with your health care provider. Document Revised: 03/15/2020 Document Reviewed: 03/15/2020 Elsevier Patient Education  2021 [employer].

## 2020-11-19 NOTE — Progress Notes (Signed)
   PRENATAL VISIT NOTE  Subjective:  Tracy Guzman is a 25 y.o. A2Z3086 at [redacted]w[redacted]d being seen today for ongoing prenatal care.  She is currently monitored for the following issues for this low-risk pregnancy and has ASTHMA; GERD; BACK PAIN; Suicidal ideation; Polysubstance abuse (HCC); Oppositional defiant disorder; PTSD (post-traumatic stress disorder); MDD (major depressive disorder), recurrent episode, moderate (HCC); Supervision of other normal pregnancy, antepartum; [redacted] weeks gestation of pregnancy; History of preterm delivery, currently pregnant in second trimester; H/O fetal anomaly in prior pregnancy, currently pregnant, first trimester; [redacted] weeks gestation of pregnancy; Vaginal discharge during pregnancy in second trimester; Back pain affecting pregnancy in second trimester; and Headache in pregnancy, antepartum, second trimester on their problem list.  Patient doing well with no acute concerns today. She reports backache and headache.  Contractions: Not present. Vag. Bleeding: None.  Movement: Present. Denies leaking of fluid.  The backache and headache are not acute, simply intermittent and somewhat chronic.  The following portions of the patient's history were reviewed and updated as appropriate: allergies, current medications, past family history, past medical history, past social history, past surgical history and problem list. Problem list updated.  Objective:   Vitals:   11/19/20 0943  BP: 107/67  Pulse: 69  Weight: 137 lb 8 oz (62.4 kg)    Fetal Status: Fetal Heart Rate (bpm): 155 Fundal Height: 15 cm Movement: Present     General:  Alert, oriented and cooperative. Patient is in no acute distress.  Skin: Skin is warm and dry. No rash noted.   Cardiovascular: Normal heart rate noted  Respiratory: Normal respiratory effort, no problems with respiration noted  Abdomen: Soft, gravid, appropriate for gestational age.  Pain/Pressure: Absent     Pelvic: Cervical exam deferred         Extremities: Normal range of motion.  Edema: None  Mental Status:  Normal mood and affect. Normal behavior. Normal judgment and thought content.   Assessment and Plan:  Pregnancy: G3P1102 at [redacted]w[redacted]d  1. [redacted] weeks gestation of pregnancy   2. Vaginal discharge during pregnancy in second trimester Pt performed self swab - Cervicovaginal ancillary only( )  3. Oppositional defiant disorder   4. MDD (major depressive disorder), recurrent episode, moderate (HCC)   5. Polysubstance abuse (HCC)   6. PTSD (post-traumatic stress disorder)   7. Supervision of other normal pregnancy, antepartum  - AFP, Serum, Open Spina Bifida  8. History of preterm delivery, currently pregnant in second trimester No s/sx of PTL  9. Back pain affecting pregnancy in second trimester Advised tylenol rest and warm heat  10. Headache in pregnancy, antepartum, second trimester  - Butalbital-APAP-Caffeine 50-325-40 MG capsule; Take 1-2 capsules by mouth every 6 (six) hours as needed for headache.  Dispense: 30 capsule; Refill: 0  Preterm labor symptoms and general obstetric precautions including but not limited to vaginal bleeding, contractions, leaking of fluid and fetal movement were reviewed in detail with the patient.  Please refer to After Visit Summary for other counseling recommendations.   Return in about 4 weeks (around 12/17/2020) for ROB, in person.   Mariel Aloe, MD Faculty Attending Center for [facility]

## 2020-11-19 NOTE — Progress Notes (Signed)
Patient presents for ROB. Patient complains of having vaginal discharge with mild odor. Patient will do a self swab. No other concerns.

## 2020-11-20 LAB — CERVICOVAGINAL ANCILLARY ONLY
Bacterial Vaginitis (gardnerella): NEGATIVE
Candida Glabrata: NEGATIVE
Candida Vaginitis: NEGATIVE
Chlamydia: NEGATIVE
Comment: NEGATIVE
Comment: NEGATIVE
Comment: NEGATIVE
Comment: NEGATIVE
Comment: NEGATIVE
Comment: NORMAL
Neisseria Gonorrhea: NEGATIVE
Trichomonas: NEGATIVE

## 2020-11-21 LAB — AFP, SERUM, OPEN SPINA BIFIDA
AFP MoM: 0.83
AFP Value: 26 ng/mL
Gest. Age on Collection Date: 15 weeks
Maternal Age At EDD: 24.4 yr
OSBR Risk 1 IN: 10000
Test Results:: NEGATIVE
Weight: 137 [lb_av]

## 2020-12-06 ENCOUNTER — Encounter: Payer: Self-pay | Admitting: *Deleted

## 2020-12-06 ENCOUNTER — Ambulatory Visit: Payer: Medicaid Other | Attending: Obstetrics and Gynecology

## 2020-12-06 ENCOUNTER — Ambulatory Visit: Payer: Medicaid Other

## 2020-12-06 ENCOUNTER — Other Ambulatory Visit: Payer: Self-pay | Admitting: *Deleted

## 2020-12-06 ENCOUNTER — Ambulatory Visit: Payer: Medicaid Other | Admitting: *Deleted

## 2020-12-06 ENCOUNTER — Other Ambulatory Visit: Payer: Self-pay

## 2020-12-06 ENCOUNTER — Ambulatory Visit: Payer: Medicaid Other | Attending: Obstetrics and Gynecology | Admitting: Obstetrics and Gynecology

## 2020-12-06 DIAGNOSIS — O09291 Supervision of pregnancy with other poor reproductive or obstetric history, first trimester: Secondary | ICD-10-CM | POA: Diagnosis not present

## 2020-12-06 DIAGNOSIS — O359XX0 Maternal care for (suspected) fetal abnormality and damage, unspecified, not applicable or unspecified: Secondary | ICD-10-CM | POA: Diagnosis not present

## 2020-12-06 DIAGNOSIS — Z3A18 18 weeks gestation of pregnancy: Secondary | ICD-10-CM

## 2020-12-06 DIAGNOSIS — Z3480 Encounter for supervision of other normal pregnancy, unspecified trimester: Secondary | ICD-10-CM | POA: Diagnosis not present

## 2020-12-06 DIAGNOSIS — Z3A11 11 weeks gestation of pregnancy: Secondary | ICD-10-CM | POA: Diagnosis not present

## 2020-12-06 DIAGNOSIS — Z362 Encounter for other antenatal screening follow-up: Secondary | ICD-10-CM

## 2020-12-06 NOTE — Progress Notes (Signed)
Maternal-Fetal Medicine   Name: Tracy Guzman DOB: [birth year] MRN: [id number] Referring Provider: Mariel Aloe, MD  I had the pleasure of seeing [facility].  She was accompanied by her case Production designer, theatre/television/film. She is G3 P1102 at 18-weeks' gestation and is here for ultrasound and consultation.  Her second child had multiple congenital anomalies at birth.   Obstetric history: 04/2016: Spontaneous preterm delivery of a female infant weighing 1,790 grams. The infant had multiple congenital anomalies that were diagnosed at prenatal ultrasound. Significant findings included: single umbilical artery, atrial septal defect, absent radii, horse-shoe kidney, polyhydramnios. 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 Currently, her son is with her grandmother.  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 She informed that geneticists are involved and has no knowledge whether the infant had karyotype/microarray analysis performed.  05/2015: Term vaginal delivery of a female infant weighing 8-14 at birth. Her daughter is in good health.  GYN history: No history of abnormal Pap smears or cervical surgeries.  No history of breast disease.  She had regular menstrual cycles before pregnancy.  Past medical history: No history of diabetes or hypertension or any other chronic medical conditions. Past surgical history: Tonsillectomy in childhood. Medications: Prenatal vitamins. Allergies: Lamictal ("rashes"). Social history: Denies tobacco or drug or alcohol use.  She is single and her partner is African-American who is not the father of her previous 2 children.  He is in good health. Family history: Both parents are in good health.  No history of venous thromboembolism in the family.  She has a sister who had normal pregnancy outcome.  Ultrasound We performed a  fetal anatomy scan. No markers of aneuploidies or fetal structural defects are seen. 3-vessel cord is seen. Long bones appear normal, and radii are seen. Fetal biometry is consistent with her previously established dates. Amniotic fluid is normal and good fetal activity is seen. Patient understands the limitations of ultrasound in detecting fetal anomalies.   Our concerns include: Previous infant with congenital anomalies No clear diagnosis is available. Based on the structural anomalies described by the mother, VATER/VACTERL Association is possible. Anomalies present in VATER include: vertebral defects, anal atresia, TOF with esophageal atresia and renal dysplasia.  Canridal and limb anomalies can also be present. Almost all cases are sporadic and genetic association can be present. The recurrence risk is very low.  I recommended genetic counseling and the patient will be returning to meet with our genetic counselor.  I discussed the significance and limitations of cell-free fetal DNA screening. I discussed amniocentesis to determine fetal karyotype and microarray analysis to identify some genetic conditions. I discussed the procedure and possible complication of miscarriage (1 in 500 procedures). Patient opted not to have amniocentesis.  I recommended fetal echocardiography since the likelihood of her baby having congenital heart malformations is around 3% to 4%. Patient agreed to have fetal echocardiography.  History of spontaneous preterm birth Patient had polyhydramnios from fetal anomaly in her previous pregnancy. Preterm birth is more likely from polyhydramnios and fetal anomaly. On today's transabdominal scan, the cervix appears long and closed. In her first pregnancy, she had term vaginal delivery. I reassured her that the likelihood of having a preterm delivery is not increased.  Recommendations: -An appointment was made for her to return in 4 weeks for completion of  fetal anatomy  scan. -We have requested an appointment for fetal  echocardiography ([facility]). -Genetic counseling in 4 weeks.  Thank you for consultation. Please contact me if you have any questions.  Consultation including face-to-face counseling 30 minutes.

## 2020-12-18 ENCOUNTER — Other Ambulatory Visit: Payer: Self-pay

## 2020-12-18 ENCOUNTER — Ambulatory Visit (INDEPENDENT_AMBULATORY_CARE_PROVIDER_SITE_OTHER): Payer: Medicaid Other | Admitting: Obstetrics and Gynecology

## 2020-12-18 VITALS — BP 107/66 | HR 77 | Wt 145.0 lb

## 2020-12-18 DIAGNOSIS — F1910 Other psychoactive substance abuse, uncomplicated: Secondary | ICD-10-CM

## 2020-12-18 DIAGNOSIS — Z3A19 19 weeks gestation of pregnancy: Secondary | ICD-10-CM | POA: Insufficient documentation

## 2020-12-18 DIAGNOSIS — O99891 Other specified diseases and conditions complicating pregnancy: Secondary | ICD-10-CM

## 2020-12-18 DIAGNOSIS — O09892 Supervision of other high risk pregnancies, second trimester: Secondary | ICD-10-CM

## 2020-12-18 DIAGNOSIS — M549 Dorsalgia, unspecified: Secondary | ICD-10-CM

## 2020-12-18 DIAGNOSIS — F331 Major depressive disorder, recurrent, moderate: Secondary | ICD-10-CM

## 2020-12-18 DIAGNOSIS — Z3480 Encounter for supervision of other normal pregnancy, unspecified trimester: Secondary | ICD-10-CM

## 2020-12-18 MED ORDER — CYCLOBENZAPRINE HCL 10 MG PO TABS: 10.0000 mg | ORAL_TABLET | Freq: Three times a day (TID) | ORAL | 1 refills | Status: DC | PRN

## 2020-12-18 NOTE — Progress Notes (Signed)
   PRENATAL VISIT NOTE  Subjective:  Tracy Guzman is a 25 y.o. [id number] at [redacted]w[redacted]d being seen today for ongoing prenatal care.  She is currently monitored for the following issues for this high-risk pregnancy and has ASTHMA; GERD; BACK PAIN; Suicidal ideation; Polysubstance abuse (HCC); Oppositional defiant disorder; PTSD (post-traumatic stress disorder); MDD (major depressive disorder), recurrent episode, moderate (HCC); Supervision of other normal pregnancy, antepartum; [redacted] weeks gestation of pregnancy; History of preterm delivery, currently pregnant in second trimester; H/O fetal anomaly in prior pregnancy, currently pregnant, second trimester; [redacted] weeks gestation of pregnancy; Vaginal discharge during pregnancy in second trimester; Back pain affecting pregnancy in second trimester; Headache in pregnancy, antepartum, second trimester; and [redacted] weeks gestation of pregnancy on their problem list.  Patient doing well with no acute concerns today. She reports no complaints.  Contractions: Not present. Vag. Bleeding: None.  Movement: Present. Denies leaking of fluid.   The following portions of the patient's history were reviewed and updated as appropriate: allergies, current medications, past family history, past medical history, past social history, past surgical history and problem list. Problem list updated.  Objective:   Vitals:   12/18/20 0943  BP: 107/66  Pulse: 77  Weight: 145 lb (65.8 kg)    Fetal Status: Fetal Heart Rate (bpm): 159 Fundal Height: 19 cm Movement: Present     General:  Alert, oriented and cooperative. Patient is in no acute distress.  Skin: Skin is warm and dry. No rash noted.   Cardiovascular: Normal heart rate noted  Respiratory: Normal respiratory effort, no problems with respiration noted  Abdomen: Soft, gravid, appropriate for gestational age.  Pain/Pressure: Present     Pelvic: Cervical exam deferred        Extremities: Normal range of motion.  Edema: None   Mental Status:  Normal mood and affect. Normal behavior. Normal judgment and thought content.   Assessment and Plan:  Pregnancy: G3P1102 at [redacted]w[redacted]d  1. [redacted] weeks gestation of pregnancy   2. Supervision of other normal pregnancy, antepartum Pt has seen MFM, anatomy scan initially normal, pt has f/u scan 01/02/21 Fetal echo scheduled for5/3/22  3. Polysubstance abuse (HCC)   4. MDD (major depressive disorder), recurrent episode, moderate (HCC) Discussed with patient in detail, she currently denies depressed mood, feelings of guilt or crying spells  5. History of preterm delivery, currently pregnant in second trimester No s/sx of PTL  6. Back pain affecting pregnancy in second trimester  - cyclobenzaprine (FLEXERIL) 10 MG tablet; Take 1 tablet (10 mg total) by mouth 3 (three) times daily as needed for muscle spasms.  Dispense: 30 tablet; Refill: 1  Preterm labor symptoms and general obstetric precautions including but not limited to vaginal bleeding, contractions, leaking of fluid and fetal movement were reviewed in detail with the patient.  Please refer to After Visit Summary for other counseling recommendations.   Return in about 4 weeks (around 01/15/2021) for [facility], in person.   Mariel Aloe, MD Faculty Attending Center for [facility]

## 2020-12-24 ENCOUNTER — Other Ambulatory Visit: Payer: Self-pay

## 2020-12-24 ENCOUNTER — Inpatient Hospital Stay (HOSPITAL_COMMUNITY)
Admission: AD | Admit: 2020-12-24 | Discharge: 2020-12-24 | Disposition: A | Discharge status: Home / Self Care | Payer: Medicaid Other | Attending: Obstetrics and Gynecology | Admitting: Obstetrics and Gynecology

## 2020-12-24 ENCOUNTER — Encounter (HOSPITAL_COMMUNITY): Payer: Self-pay | Admitting: Obstetrics and Gynecology

## 2020-12-24 DIAGNOSIS — O26892 Other specified pregnancy related conditions, second trimester: Secondary | ICD-10-CM | POA: Insufficient documentation

## 2020-12-24 DIAGNOSIS — Z3A20 20 weeks gestation of pregnancy: Secondary | ICD-10-CM | POA: Diagnosis not present

## 2020-12-24 DIAGNOSIS — N898 Other specified noninflammatory disorders of vagina: Secondary | ICD-10-CM | POA: Insufficient documentation

## 2020-12-24 DIAGNOSIS — Z87891 Personal history of nicotine dependence: Secondary | ICD-10-CM | POA: Insufficient documentation

## 2020-12-24 DIAGNOSIS — O23592 Infection of other part of genital tract in pregnancy, second trimester: Secondary | ICD-10-CM | POA: Diagnosis not present

## 2020-12-24 DIAGNOSIS — Z3480 Encounter for supervision of other normal pregnancy, unspecified trimester: Secondary | ICD-10-CM

## 2020-12-24 LAB — WET PREP, GENITAL
Clue Cells Wet Prep HPF POC: NONE SEEN
Sperm: NONE SEEN
Trich, Wet Prep: NONE SEEN
Yeast Wet Prep HPF POC: NONE SEEN

## 2020-12-24 LAB — POCT FERN TEST: POCT Fern Test: NEGATIVE

## 2020-12-24 NOTE — MAU Provider Note (Signed)
History     CSN: [id number]  Arrival date and time: 12/24/20 1359   Event Date/Time   First Provider Initiated Contact with Patient 12/24/20 1439      Chief Complaint  Patient presents with  . Vaginal Discharge   25 y.o. O2V0350 @20 .4 wks presenting with watery discharge. Reports onset yesterday. No gush of fluid. Had IC yesterday. Denies abd pain and cramping. Denies itching or malodor. No VB or spotting.  OB History    Gravida  3   Para  2   Term  1   Preterm  1   AB      Living  2     SAB      IAB      Ectopic      Multiple      Live Births  2           Past Medical History:  Diagnosis Date  . Allergy    Smoke, dust, Pollen  . Asthma   . Asthma   . Depression   . Endometriosis   . GERD (gastroesophageal reflux disease)     Past Surgical History:  Procedure Laterality Date  . MYRINGOTOMY    . TONSILLECTOMY AND ADENOIDECTOMY  as infant  . WRIST SURGERY  age 15    Family History  Problem Relation Age of Onset  . Hypertension Mother   . Hypertension Father   . Depression Mother   . Hyperlipidemia Mother   . Depression Maternal Grandmother   . Drug abuse Cousin   . Drug abuse Maternal Uncle   . Hyperlipidemia Father     Social History   Tobacco Use  . Smoking status: Former Smoker    Packs/day: 1.00    Years: 1.00    Pack years: 1.00    Types: Cigarettes  . Smokeless tobacco: Never Used  Vaping Use  . Vaping Use: Never used  Substance Use Topics  . Alcohol use: Not Currently    Comment: not since confirmed pregnancy  . Drug use: Not Currently    Types: Other-see comments, MDMA (Ecstacy), Marijuana    Comment: last used end of December    Allergies:  Allergies  Allergen Reactions  . Lamotrigine Rash    No medications prior to admission.    Review of Systems  Gastrointestinal: Negative for abdominal pain.  Genitourinary: Positive for vaginal discharge. Negative for vaginal bleeding.   Physical Exam   Blood  pressure (!) 98/52, pulse 95, temperature 98.2 F (36.8 C), temperature source Oral, resp. rate 16, height 5\' 4"  (1.626 m), weight 66.1 kg, last menstrual period 08/02/2020, SpO2 97 %.  Physical Exam Constitutional:      General: She is not in acute distress.    Appearance: Normal appearance.  HENT:     Head: Normocephalic and atraumatic.  Cardiovascular:     Rate and Rhythm: Normal rate.  Pulmonary:     Effort: Pulmonary effort is normal. No respiratory distress.  Abdominal:     Palpations: Abdomen is soft.     Tenderness: There is no abdominal tenderness.  Genitourinary:    Comments: SSE: no pool, fern SVE: closed/thick Musculoskeletal:        General: Normal range of motion.     Cervical back: Normal range of motion.  Skin:    General: Skin is warm and dry.  Neurological:     General: No focal deficit present.     Mental Status: She is alert and oriented to person, place,  and time.  Psychiatric:        Mood and Affect: Mood normal.        Behavior: Behavior normal.   FHT 165  Results for orders placed or performed during the hospital encounter of 12/24/20 (from the past 24 hour(s))  Wet prep, genital     Status: Abnormal   Collection Time: 12/24/20  2:50 PM  Result Value Ref Range   Yeast Wet Prep HPF POC NONE SEEN NONE SEEN   Trich, Wet Prep NONE SEEN NONE SEEN   Clue Cells Wet Prep HPF POC NONE SEEN NONE SEEN   WBC, Wet Prep HPF POC MANY (A) NONE SEEN   Sperm NONE SEEN   Fern Test     Status: None   Collection Time: 12/24/20  3:41 PM  Result Value Ref Range   POCT Fern Test Negative = intact amniotic membranes    MAU Course  Procedures  MDM Labs ordered and reviewed. No signs of PROM or infection. Pt reassurred. Stable for discharge home.   Assessment and Plan   1. [redacted] weeks gestation of pregnancy   2. Supervision of other normal pregnancy, antepartum   3. Vaginal discharge during pregnancy in second trimester    Discharge home Follow up at [facility] as  scheduled PTL precautions  Allergies as of 12/24/2020      Reactions   Lamotrigine Rash      Medication List    TAKE these medications   albuterol 108 (90 Base) MCG/ACT inhaler Commonly known as: VENTOLIN HFA Inhale 1-2 puffs into the lungs every 6 (six) hours as needed for wheezing or shortness of breath.   Blood Pressure Monitor Devi Please check blood pressure 1-2 per week   Butalbital-APAP-Caffeine 50-325-40 MG capsule Take 1-2 capsules by mouth every 6 (six) hours as needed for headache.   cyclobenzaprine 10 MG tablet Commonly known as: FLEXERIL Take 1 tablet (10 mg total) by mouth 3 (three) times daily as needed for muscle spasms.   prenatal vitamin w/FE, FA 27-1 MG Tabs tablet Take 1 tablet by mouth daily at 12 noon.      Donette Larry, CNM 12/24/2020, 3:42 PM

## 2020-12-24 NOTE — MAU Note (Signed)
Tracy Guzman is a 25 y.o. at [redacted]w[redacted]d here in MAU reporting: yesterday started noticing watery discharge. Now is also having some mucus discharge. No bleeding. No pain. Last IC was 2-3 days ago.   Onset of complaint: yesterday  Pain score: 0/10  Vitals:   12/24/20 1414  BP: 118/71  Pulse: 89  Resp: 16  Temp: 98.2 F (36.8 C)  SpO2: 97%     FHT:165  Lab orders placed from triage: none

## 2020-12-25 LAB — GC/CHLAMYDIA PROBE AMP (~~LOC~~) NOT AT ARMC
Chlamydia: NEGATIVE
Comment: NEGATIVE
Comment: NORMAL
Neisseria Gonorrhea: NEGATIVE

## 2020-12-27 ENCOUNTER — Telehealth: Payer: Self-pay

## 2020-12-27 NOTE — Telephone Encounter (Signed)
Per [facility] Patient scheduled for Fetal Echo at: [facility] on 01/07/21 @ 9am

## 2021-01-02 ENCOUNTER — Ambulatory Visit: Payer: Medicaid Other | Attending: Obstetrics and Gynecology

## 2021-01-02 ENCOUNTER — Ambulatory Visit (HOSPITAL_BASED_OUTPATIENT_CLINIC_OR_DEPARTMENT_OTHER): Payer: Medicaid Other | Admitting: Genetic Counselor

## 2021-01-02 ENCOUNTER — Encounter: Payer: Self-pay | Admitting: *Deleted

## 2021-01-02 ENCOUNTER — Ambulatory Visit: Payer: Medicaid Other | Admitting: *Deleted

## 2021-01-02 ENCOUNTER — Other Ambulatory Visit: Payer: Self-pay

## 2021-01-02 DIAGNOSIS — O09212 Supervision of pregnancy with history of pre-term labor, second trimester: Secondary | ICD-10-CM

## 2021-01-02 DIAGNOSIS — Z315 Encounter for genetic counseling: Secondary | ICD-10-CM | POA: Diagnosis not present

## 2021-01-02 DIAGNOSIS — O99342 Other mental disorders complicating pregnancy, second trimester: Secondary | ICD-10-CM

## 2021-01-02 DIAGNOSIS — J45909 Unspecified asthma, uncomplicated: Secondary | ICD-10-CM

## 2021-01-02 DIAGNOSIS — O99322 Drug use complicating pregnancy, second trimester: Secondary | ICD-10-CM | POA: Diagnosis not present

## 2021-01-02 DIAGNOSIS — Z3480 Encounter for supervision of other normal pregnancy, unspecified trimester: Secondary | ICD-10-CM | POA: Diagnosis present

## 2021-01-02 DIAGNOSIS — Z362 Encounter for other antenatal screening follow-up: Secondary | ICD-10-CM | POA: Insufficient documentation

## 2021-01-02 DIAGNOSIS — O359XX0 Maternal care for (suspected) fetal abnormality and damage, unspecified, not applicable or unspecified: Secondary | ICD-10-CM | POA: Diagnosis not present

## 2021-01-02 DIAGNOSIS — O99512 Diseases of the respiratory system complicating pregnancy, second trimester: Secondary | ICD-10-CM

## 2021-01-02 DIAGNOSIS — O09299 Supervision of pregnancy with other poor reproductive or obstetric history, unspecified trimester: Secondary | ICD-10-CM

## 2021-01-02 DIAGNOSIS — F1910 Other psychoactive substance abuse, uncomplicated: Secondary | ICD-10-CM | POA: Diagnosis not present

## 2021-01-02 DIAGNOSIS — F32A Depression, unspecified: Secondary | ICD-10-CM

## 2021-01-02 DIAGNOSIS — Z3A21 21 weeks gestation of pregnancy: Secondary | ICD-10-CM

## 2021-01-02 NOTE — Progress Notes (Addendum)
ADDENDUM 02/04/21: Upon review of Jacobi's records, it does not appear that genetic testing has ever been completed. For this reason, precise risk assessment for the current fetus and future pregnancies remains limited.  ----------------------------------------------------------------------------------------------------------------------  01/02/2021  [name] 02-08-1996 MRN: [id number] DOV: 01/02/2021  Ms. Mella presented to the [facility] for Maternal [facility]etal [facility] for a genetics consultation regarding her history of a previous child with congenital anomalies. Ms. Kantor presented to her appointment alone.   Indication for genetic counseling - Previous child with congenital anomalies  Prenatal history  Ms. Taliaferro is a [id number], 25 y.o. female. Her current pregnancy has completed [redacted]w[redacted]d (Estimated Date of Delivery: 05/09/21). Ms. Takacs has a 51 year old daughter and a 3 year old son from a prior relationship. Of note, Ms. Preece and the father of the current baby are no longer together.   Ms. Hillesheim denied exposure to environmental toxins or chemical agents. She denied the use of alcohol, tobacco or street drugs. She discontinued cigarette use within the first few weeks of her pregnancy. She reported taking prenatal vitamins, cyclobenzaprine, and butalbital-caffeine. She denied significant viral illnesses and fevers during the course of her pregnancy. She experienced bleeding due to a subchorionic hematoma around 9 weeks' gestation. Her medical and surgical histories were noncontributory.  Family History  A three generation pedigree was drafted and reviewed. The family history is remarkable for the following:  - Ms. Mccrystal's son was born with multiple congenital anomalies. See Discussion section for more details.  - Ms. Gassert reported a history of learning disabilities in distant paternal relatives. She was uncertain who exactly in the family  had learning disabilities and did not have further information about the rest of their medical/developmental histories; thus, risk assessment was limited.  - The father of the baby had a stillbirth with a prior partner. Ms. Caradonna had no further details about the cause of this stillbirth.   The remaining family histories were reviewed and found to be noncontributory for birth defects, intellectual disability, recurrent pregnancy loss, and known genetic conditions. Ms. Horsley had limited information about the father of the baby's family history; thus, risk assessment was limited.    The patient's ancestry is [place]. The father of the pregnancy's ancestry is African American. Ashkenazi Jewish ancestry and consanguinity were denied. Pedigree will be scanned under Media.  Discussion  Previous child with congenital anomalies:  Ms. Posa was referred for genetic counseling due to her history of a previous child with multiple congenital anomalies. Ms. Putman son, Kateri Mc, was born with an atrial septal defect (ASD), tracheoesophageal fistula (TEF), horseshoe kidney, clubbed hands, and missing radii. He is now 24 years old and is exclusively tube-fed. He also has developmental delays. Ms. Darcey expressed that her son's diagnosis put a great amount of stress on her relationship with Jacobi's father and that they separated as a result. Rayna Sexton now lives with his grandmother.  Possible explanations:  We discussed that it is possible that a genetic condition could explain Jacobi's features. Oftentimes, genetic conditions present with a variety of medical and developmental abnormalities. I inquired if Rayna Sexton has had any genetic testing performed. Ms. Julson informed me that she believes that genetic testing is just now being pursued, as the waitlist for an appointment with pediatric genetics at [facility] was very long. She gave verbal consent for me to access Jacobi's  medical records to determine which, if any, genetic tests have been ordered.  Ms. Curfman did recall that Rayna Sexton was diagnosed  with VACTERL at one point. However, he has many additional features that are not associated with VACTERL, suggesting there could be a different explanation for his features. VACTERL association is associated with birth defects that affect multiple anatomical structures. The term VACTERL is an acronym with each letter representing one of the common findings seen in affected children (vertebral defects, anal atresia, cardiac defects, TEF, renal anomalies, and limb abnormalities). Individuals with VACTERL association typically have at least three of these characteristic features. We reviewed that VACTERL association is a complex condition that does not have a single identifiable genetic cause. Most cases are sporadic, occurring in individuals with no family history of the condition. The risk of recurrence for siblings of individuals with VACTERL association is low. Familial cases of VACTERL association have been rarely reported but do not follow a clear pattern of inheritance.  We discussed that if Jacobi's anomalies were caused by a genetic condition different than VACTERL association, there are multiple ways it could have been inherited. Firstly, some genetic conditions are inherited in an autosomal dominant fashion. Autosomal dominant conditions only require a pathogenic variant to be present in one copy of a gene to cause the condition. Secondly, some genetic conditions are inherited in an autosomal recessive fashion. Both parents must be carriers of the same recessive condition in order to be at risk of having an affected child. Thirdly, some conditions are inherited in an X-linked fashion. X-linked conditions occur due to a pathogenic variant in a gene located on the X chromosome. X-linked conditions often affect males more severely than females, as males only have one X  chromosome whereas females have two.  Since a genetic cause has not yet been identified for Jacobi's anomalies (to Ms. Castelo's knowledge), risk assessment is limited. Ms. Apple was counseled that the precise risk of recurrence for the current fetus and future pregnancies depends on the underlying etiology for Jacobi's features. Some conditions are inherited from a parent while other genetic conditions are de novo (new), occurring for the first time in an affected individual. If Rayna Sexton has a genetic condition that was inherited, risk of recurrence would depend on the mode of inheritance for that condition. If he has a de novo genetic condition, risk of recurrence for future pregnancies would likely be low, but not fully eliminated due to the possibility of germline mosaicism (which can never be ruled out). If Jacobi's records reveal an underlying etiology for his features, I will contact Ms. Matteo to provide a more accurate risk assessment.  Aneuploidy screening results:  We reviewed that Ms. Ticer had [facility] noninvasive prenatal screening (NIPS) through the laboratory [facility] that was low-risk for fetal aneuploidies. We reviewed that these results showed a less than 1 in 10,000 risk for trisomies 21, 18 and 13, and monosomy X (Turner syndrome). In addition, the risk for triploidy and sex chromosome trisomies (47,XXX and 47,XXY) was also low. Ms. Eddie elected to have cfDNA analysis for 22q11.2 deletion syndrome, which was also low risk (1 in 12,000). We reviewed that while this testing identifies 94-99% of pregnancies with trisomy 20, trisomy 64, trisomy 15, and >70% of cases of sex chromosome aneuploidies, and triploidy, it is NOT diagnostic. A positive test result requires confirmation by CVS or amniocentesis, and a negative test result does not rule out a fetal chromosome abnormality. She also understands that this testing does not identify all genetic  conditions.  Ultrasound:  A complete ultrasound was performed today prior to our visit. The ultrasound report will  be sent under separate cover. There were no visualized fetal anomalies or markers suggestive of aneuploidy.   Ms. Hitt understands that ultrasounds cannot rule out all possible birth defects or genetic syndromes, as many of Jacobi's features were not diagnosed until the postnatal period. Ms. Shingledecker is scheduled for a fetal echocardiogram with [facility] on 01/07/21 given her son's congenital heart defect.  Additional testing options:  Ms. Grajales was also counseled regarding diagnostic testing via amniocentesis. We discussed the technical aspects of the procedure and quoted up to a 1 in 500 (0.2%) risk for spontaneous pregnancy loss or other adverse pregnancy outcomes as a result of amniocentesis. Cultured cells from an amniocentesis sample allow for the visualization of a fetal karyotype, which can detect >99% of large chromosomal aberrations. Chromosomal microarray can also be performed to identify smaller deletions or duplications of fetal chromosomal material. Amniocentesis could also be performed to assess whether the fetus is affected by a single gene condition. However, it is difficult to determine which tests to order without knowing the precise cause of Jacobi's symptoms, especially in the context of a normal ultrasound.   After careful consideration, Ms. Altamura declined amniocentesis at this time. She understands that amniocentesis is available at any point after 16 weeks of pregnancy and that she may opt to undergo the procedure at a later date should she change her mind. She informed me that she would only consider amniocentesis if a major anomaly were identified on ultrasound or if there was a known genetic condition that the current fetus could be at risk for. Otherwise, she does not feel that the benefits from an amniocentesis outweigh the risks.  Plan:  Ms.  Driskill declined further testing today. She feels reassured by her normal-appearing ultrasounds, though understands first-hand the limitations of ultrasound in being able to detect all anomalies/conditions. I will review Jacobi's records to determine if genetic testing has been completed. I will then contact Ms. Pohlman to discuss recurrence risks for the current fetus and future pregnancies if I discover any relevant findings. Ms. Surrette was agreeable with this plan.  I counseled Ms. Kue regarding the above risks and available options. The approximate face-to-face time with the genetic counselor was 30 minutes.  In summary:  Reviewed family history concerns  Son with multiple congenital anomalies. 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, but may have had a further genetic testing work-up completed  Patient provided verbal permission to review son's records to determine if a genetic etiology for his features has been identified  Reviewed results of ultrasound  No fetal anomalies or markers seen  Reduction in risk for fetal aneuploidy  Cannot rule out all birth defects or genetic conditions  Discussed low-risk NIPS result  Reduction in risk for Down syndrome, trisomy 76, trisomy 28, triploidy, sex chromosome aneuploidies, and 22q11.2 deletion syndrome  Does not rule out possibility of genetic condition  Offered additional testing and screening  Declined amniocentesis  Fetal echocardiogram 5/3 at [facility], [place], [facility] Genetic Counselor

## 2021-01-03 ENCOUNTER — Other Ambulatory Visit: Payer: Self-pay | Admitting: *Deleted

## 2021-01-03 DIAGNOSIS — F1910 Other psychoactive substance abuse, uncomplicated: Secondary | ICD-10-CM

## 2021-01-07 ENCOUNTER — Encounter: Payer: Self-pay | Admitting: Pediatric Cardiology

## 2021-01-14 ENCOUNTER — Ambulatory Visit: Payer: Self-pay

## 2021-01-15 ENCOUNTER — Ambulatory Visit (INDEPENDENT_AMBULATORY_CARE_PROVIDER_SITE_OTHER): Payer: Medicaid Other | Admitting: Obstetrics and Gynecology

## 2021-01-15 ENCOUNTER — Other Ambulatory Visit: Payer: Self-pay

## 2021-01-15 ENCOUNTER — Other Ambulatory Visit (HOSPITAL_COMMUNITY)
Admission: RE | Admit: 2021-01-15 | Discharge: 2021-01-15 | Disposition: A | Discharge status: Home / Self Care | Payer: Medicaid Other | Source: Ambulatory Visit | Attending: Obstetrics and Gynecology | Admitting: Obstetrics and Gynecology

## 2021-01-15 ENCOUNTER — Encounter: Payer: Self-pay | Admitting: Obstetrics and Gynecology

## 2021-01-15 VITALS — BP 106/66 | HR 82 | Wt 150.0 lb

## 2021-01-15 DIAGNOSIS — Z3480 Encounter for supervision of other normal pregnancy, unspecified trimester: Secondary | ICD-10-CM | POA: Insufficient documentation

## 2021-01-15 DIAGNOSIS — F331 Major depressive disorder, recurrent, moderate: Secondary | ICD-10-CM

## 2021-01-15 DIAGNOSIS — O09292 Supervision of pregnancy with other poor reproductive or obstetric history, second trimester: Secondary | ICD-10-CM

## 2021-01-15 DIAGNOSIS — O09892 Supervision of other high risk pregnancies, second trimester: Secondary | ICD-10-CM

## 2021-01-15 NOTE — Progress Notes (Signed)
   PRENATAL VISIT NOTE  Subjective:  Tracy Guzman is a 25 y.o. [id number] at [redacted]w[redacted]d being seen today for ongoing prenatal care.  She is currently monitored for the following issues for this high-risk pregnancy and has ASTHMA; GERD; BACK PAIN; Suicidal ideation; Polysubstance abuse (HCC); Oppositional defiant disorder; PTSD (post-traumatic stress disorder); MDD (major depressive disorder), recurrent episode, moderate (HCC); Supervision of other normal pregnancy, antepartum; History of preterm delivery, currently pregnant in second trimester; H/O fetal anomaly in prior pregnancy, currently pregnant, second trimester; Back pain affecting pregnancy in second trimester; and Headache in pregnancy, antepartum, second trimester on their problem list.  Patient reports urinary frequency and dysuria as well as a right bartholin abscess.  Contractions: Not present. Vag. Bleeding: None.  Movement: Present. Denies leaking of fluid.   The following portions of the patient's history were reviewed and updated as appropriate: allergies, current medications, past family history, past medical history, past social history, past surgical history and problem list.   Objective:   Vitals:   01/15/21 0944  BP: 106/66  Pulse: 82  Weight: 150 lb (68 kg)    Fetal Status:     Movement: Present     General:  Alert, oriented and cooperative. Patient is in no acute distress.  Skin: Skin is warm and dry. No rash noted.   Cardiovascular: Normal heart rate noted  Respiratory: Normal respiratory effort, no problems with respiration noted  Abdomen: Soft, gravid, appropriate for gestational age.  Pain/Pressure: Absent     Pelvic: Cervical exam deferred        Extremities: Normal range of motion.  Edema: None  Mental Status: Normal mood and affect. Normal behavior. Normal judgment and thought content.   Assessment and Plan:  Pregnancy: G3P1102 at [redacted]w[redacted]d 1. Supervision of other normal pregnancy, antepartum Patient is  doing well Third trimester labs and glucola next visit Small bartholin gland enlargement, non tender- advised to apply warm compresses and sitz baths Urine cultures and vaginal swab collected  2. History of preterm delivery, currently pregnant in second trimester   3. H/O fetal anomaly in prior pregnancy, currently pregnant, second trimester Normal anatomy Normal fetal echo  4. MDD (major depressive disorder), recurrent episode, moderate (HCC) Stable without medication  Preterm labor symptoms and general obstetric precautions including but not limited to vaginal bleeding, contractions, leaking of fluid and fetal movement were reviewed in detail with the patient. Please refer to After Visit Summary for other counseling recommendations.   Return in about 4 weeks (around 02/12/2021) for in person, ROB, High risk, 2 hr glucola next visit.  Future Appointments  Date Time Provider Department Center  01/15/2021 10:00 AM Latima Hamza, Gigi Gin, MD [facility] None  03/06/2021  9:00 AM [facility] NURSE [facility]  03/06/2021  9:15 AM [facility] US2 [facility]    Catalina Antigua, MD

## 2021-01-15 NOTE — Progress Notes (Signed)
ROB [redacted]w[redacted]d   CC: vaginal irritation and burning w/ urination. Pt thinks she may have bartholin's wants exam. Onset just a few days ago.

## 2021-01-16 ENCOUNTER — Other Ambulatory Visit: Payer: Self-pay | Admitting: Obstetrics and Gynecology

## 2021-01-16 LAB — CERVICOVAGINAL ANCILLARY ONLY
Bacterial Vaginitis (gardnerella): POSITIVE — AB
Candida Glabrata: NEGATIVE
Candida Vaginitis: POSITIVE — AB
Chlamydia: NEGATIVE
Comment: NEGATIVE
Comment: NEGATIVE
Comment: NEGATIVE
Comment: NEGATIVE
Comment: NEGATIVE
Comment: NORMAL
Neisseria Gonorrhea: NEGATIVE
Trichomonas: NEGATIVE

## 2021-01-16 MED ORDER — TERCONAZOLE 0.8 % VA CREA: 1.0000 | TOPICAL_CREAM | Freq: Every day | VAGINAL | 0 refills | Status: DC

## 2021-01-16 MED ORDER — METRONIDAZOLE 500 MG PO TABS: 500.0000 mg | ORAL_TABLET | Freq: Two times a day (BID) | ORAL | 0 refills | Status: DC

## 2021-01-16 NOTE — Addendum Note (Signed)
Addended by: Catalina Antigua on: 01/16/2021 01:05 PM   Modules accepted: Orders

## 2021-01-17 ENCOUNTER — Other Ambulatory Visit: Payer: Self-pay

## 2021-01-17 DIAGNOSIS — N760 Acute vaginitis: Secondary | ICD-10-CM

## 2021-01-17 LAB — CULTURE, OB URINE

## 2021-01-17 LAB — URINE CULTURE, OB REFLEX

## 2021-01-17 MED ORDER — METRONIDAZOLE 0.75 % VA GEL: 1.0000 | Freq: Every day | VAGINAL | 0 refills | Status: DC

## 2021-02-12 ENCOUNTER — Other Ambulatory Visit: Payer: Medicaid Other

## 2021-02-12 ENCOUNTER — Other Ambulatory Visit: Payer: Self-pay | Admitting: Obstetrics

## 2021-02-12 ENCOUNTER — Other Ambulatory Visit (HOSPITAL_COMMUNITY)
Admission: RE | Admit: 2021-02-12 | Discharge: 2021-02-12 | Disposition: A | Discharge status: Home / Self Care | Payer: Medicaid Other | Source: Ambulatory Visit | Attending: Family Medicine | Admitting: Family Medicine

## 2021-02-12 ENCOUNTER — Ambulatory Visit (INDEPENDENT_AMBULATORY_CARE_PROVIDER_SITE_OTHER): Payer: Medicaid Other | Admitting: Family Medicine

## 2021-02-12 ENCOUNTER — Other Ambulatory Visit: Payer: Self-pay

## 2021-02-12 ENCOUNTER — Encounter: Payer: Self-pay | Admitting: Family Medicine

## 2021-02-12 VITALS — BP 106/69 | HR 90 | Wt 157.0 lb

## 2021-02-12 DIAGNOSIS — Z23 Encounter for immunization: Secondary | ICD-10-CM | POA: Diagnosis not present

## 2021-02-12 DIAGNOSIS — Z113 Encounter for screening for infections with a predominantly sexual mode of transmission: Secondary | ICD-10-CM | POA: Diagnosis present

## 2021-02-12 DIAGNOSIS — O09892 Supervision of other high risk pregnancies, second trimester: Secondary | ICD-10-CM

## 2021-02-12 DIAGNOSIS — O09292 Supervision of pregnancy with other poor reproductive or obstetric history, second trimester: Secondary | ICD-10-CM

## 2021-02-12 DIAGNOSIS — Z3480 Encounter for supervision of other normal pregnancy, unspecified trimester: Secondary | ICD-10-CM

## 2021-02-12 NOTE — Progress Notes (Signed)
ROB [redacted]w[redacted]d  Tdap: Will receive today.  CC: Burning w/ urination. pt recently found out her partner has been unfaithful wants STD Screening done pt ok with doing a self swab.

## 2021-02-12 NOTE — Progress Notes (Signed)
   Subjective:  Tracy Guzman is a 25 y.o. [id number] at [redacted]w[redacted]d being seen today for ongoing prenatal care.  She is currently monitored for the following issues for this high-risk pregnancy and has ASTHMA; GERD; BACK PAIN; Suicidal ideation; Polysubstance abuse (HCC); Oppositional defiant disorder; PTSD (post-traumatic stress disorder); MDD (major depressive disorder), recurrent episode, moderate (HCC); Supervision of other normal pregnancy, antepartum; History of preterm delivery, currently pregnant in second trimester; H/O fetal anomaly in prior pregnancy, currently pregnant, second trimester; Back pain affecting pregnancy in second trimester; and Headache in pregnancy, antepartum, second trimester on their problem list.  Patient reports no complaints.  Contractions: Irritability.  .  Movement: Present. Denies leaking of fluid.   The following portions of the patient's history were reviewed and updated as appropriate: allergies, current medications, past family history, past medical history, past social history, past surgical history and problem list. Problem list updated.  Objective:   Vitals:   02/12/21 0830  BP: 106/69  Pulse: 90  Weight: 157 lb (71.2 kg)    Fetal Status:     Movement: Present     General:  Alert, oriented and cooperative. Patient is in no acute distress.  Skin: Skin is warm and dry. No rash noted.   Cardiovascular: Normal heart rate noted  Respiratory: Normal respiratory effort, no problems with respiration noted  Abdomen: Soft, gravid, appropriate for gestational age. Pain/Pressure: Absent     Pelvic:       Cervical exam deferred        Extremities: Normal range of motion.  Edema: None  Mental Status: Normal mood and affect. Normal behavior. Normal judgment and thought content.   Urinalysis:      Assessment and Plan:  Pregnancy: G3P1102 at [redacted]w[redacted]d  1. Supervision of other normal pregnancy, antepartum BP and FHR normal 3rd tri labs and TDaP today  Discussed  contraception, has tried many other options and prefers patch - Glucose Tolerance, 2 Hours w/1 Hour - CBC - RPR - HIV Antibody (routine testing w rflx)  2. Screening for STD (sexually transmitted disease) Concerned about partner infidelity, self swab today - Cervicovaginal ancillary only( [place])  3. Need for Tdap vaccination  - Tdap vaccine greater than or equal to 7yo IM  4. History of preterm delivery, currently pregnant in second trimester Not on makena  5. H/O fetal anomaly in prior pregnancy, currently pregnant, second trimester Last child with ASD Fetal echo normal  Preterm labor symptoms and general obstetric precautions including but not limited to vaginal bleeding, contractions, leaking of fluid and fetal movement were reviewed in detail with the patient. Please refer to After Visit Summary for other counseling recommendations.  Return in 2 weeks (on 02/26/2021) for [facility], ob visit.   Venora Maples, MD

## 2021-02-12 NOTE — Patient Instructions (Signed)
 Contraception Choices Contraception, also called birth control, refers to methods or devices that prevent pregnancy. Hormonal methods Contraceptive implant A contraceptive implant is a thin, plastic tube that contains a hormone that prevents pregnancy. It is different from an intrauterine device (IUD). It is inserted into the upper part of the arm by a health care provider. Implants can be effective for up to 3 years. Progestin-only injections Progestin-only injections are injections of progestin, a synthetic form of the hormone progesterone. They are given every 3 months by a health care provider. Birth control pills Birth control pills are pills that contain hormones that prevent pregnancy. They must be taken once a day, preferably at the same time each day. A prescription is needed to use this method of contraception. Birth control patch The birth control patch contains hormones that prevent pregnancy. It is placed on the skin and must be changed once a week for three weeks and removed on the fourth week. A prescription is needed to use this method of contraception. Vaginal ring A vaginal ring contains hormones that prevent pregnancy. It is placed in the vagina for three weeks and removed on the fourth week. After that, the process is repeated with a new ring. A prescription is needed to use this method of contraception. Emergency contraceptive Emergency contraceptives prevent pregnancy after unprotected sex. They come in pill form and can be taken up to 5 days after sex. They work best the sooner they are taken after having sex. Most emergency contraceptives are available without a prescription. This method should not be used as your only form of birth control.   Barrier methods Female condom A female condom is a thin sheath that is worn over the penis during sex. Condoms keep sperm from going inside a woman's body. They can be used with a sperm-killing substance (spermicide) to increase their  effectiveness. They should be thrown away after one use. Female condom A female condom is a soft, loose-fitting sheath that is put into the vagina before sex. The condom keeps sperm from going inside a woman's body. They should be thrown away after one use. Diaphragm A diaphragm is a soft, dome-shaped barrier. It is inserted into the vagina before sex, along with a spermicide. The diaphragm blocks sperm from entering the uterus, and the spermicide kills sperm. A diaphragm should be left in the vagina for 6-8 hours after sex and removed within 24 hours. A diaphragm is prescribed and fitted by a health care provider. A diaphragm should be replaced every 1-2 years, after giving birth, after gaining more than 15 lb (6.8 kg), and after pelvic surgery. Cervical cap A cervical cap is a round, soft latex or plastic cup that fits over the cervix. It is inserted into the vagina before sex, along with spermicide. It blocks sperm from entering the uterus. The cap should be left in place for 6-8 hours after sex and removed within 48 hours. A cervical cap must be prescribed and fitted by a health care provider. It should be replaced every 2 years. Sponge A sponge is a soft, circular piece of polyurethane foam with spermicide in it. The sponge helps block sperm from entering the uterus, and the spermicide kills sperm. To use it, you make it wet and then insert it into the vagina. It should be inserted before sex, left in for at least 6 hours after sex, and removed and thrown away within 30 hours. Spermicides Spermicides are chemicals that kill or block sperm from entering the   cervix and uterus. They can come as a cream, jelly, suppository, foam, or tablet. A spermicide should be inserted into the vagina with an applicator at least 10-15 minutes before sex to allow time for it to work. The process must be repeated every time you have sex. Spermicides do not require a prescription.   Intrauterine  contraception Intrauterine device (IUD) An IUD is a T-shaped device that is put in a woman's uterus. There are two types:  Hormone IUD.This type contains progestin, a synthetic form of the hormone progesterone. This type can stay in place for 3-5 years.  Copper IUD.This type is wrapped in copper wire. It can stay in place for 10 years. Permanent methods of contraception Female tubal ligation In this method, a woman's fallopian tubes are sealed, tied, or blocked during surgery to prevent eggs from traveling to the uterus. Hysteroscopic sterilization In this method, a small, flexible insert is placed into each fallopian tube. The inserts cause scar tissue to form in the fallopian tubes and block them, so sperm cannot reach an egg. The procedure takes about 3 months to be effective. Another form of birth control must be used during those 3 months. Female sterilization This is a procedure to tie off the tubes that carry sperm (vasectomy). After the procedure, the man can still ejaculate fluid (semen). Another form of birth control must be used for 3 months after the procedure. Natural planning methods Natural family planning In this method, a couple does not have sex on days when the woman could become pregnant. Calendar method In this method, the woman keeps track of the length of each menstrual cycle, identifies the days when pregnancy can happen, and does not have sex on those days. Ovulation method In this method, a couple avoids sex during ovulation. Symptothermal method This method involves not having sex during ovulation. The woman typically checks for ovulation by watching changes in her temperature and in the consistency of cervical mucus. Post-ovulation method In this method, a couple waits to have sex until after ovulation. Where to find more information  Centers for Disease Control and Prevention: www.cdc.gov Summary  Contraception, also called birth control, refers to methods or  devices that prevent pregnancy.  Hormonal methods of contraception include implants, injections, pills, patches, vaginal rings, and emergency contraceptives.  Barrier methods of contraception can include female condoms, female condoms, diaphragms, cervical caps, sponges, and spermicides.  There are two types of IUDs (intrauterine devices). An IUD can be put in a woman's uterus to prevent pregnancy for 3-5 years.  Permanent sterilization can be done through a procedure for males and females. Natural family planning methods involve nothaving sex on days when the woman could become pregnant. This information is not intended to replace advice given to you by your health care provider. Make sure you discuss any questions you have with your health care provider. Document Revised: 01/29/2020 Document Reviewed: 01/29/2020 Elsevier Patient Education  2021 [employer] Inc.   Breastfeeding  Choosing to breastfeed is one of the best decisions you can make for yourself and your baby. A change in hormones during pregnancy causes your breasts to make breast milk in your milk-producing glands. Hormones prevent breast milk from being released before your baby is born. They also prompt milk flow after birth. Once breastfeeding has begun, thoughts of your baby, as well as his or her sucking or crying, can stimulate the release of milk from your milk-producing glands. Benefits of breastfeeding Research shows that breastfeeding offers many health benefits   for infants and mothers. It also offers a cost-free and convenient way to feed your baby. For your baby  Your first milk (colostrum) helps your baby's digestive system to function better.  Special cells in your milk (antibodies) help your baby to fight off infections.  Breastfed babies are less likely to develop asthma, allergies, obesity, or type 2 diabetes. They are also at lower risk for sudden infant death syndrome (SIDS).  Nutrients in breast milk are better  able to meet your baby's needs compared to infant formula.  Breast milk improves your baby's brain development. For you  Breastfeeding helps to create a very special bond between you and your baby.  Breastfeeding is convenient. Breast milk costs nothing and is always available at the correct temperature.  Breastfeeding helps to burn calories. It helps you to lose the weight that you gained during pregnancy.  Breastfeeding makes your uterus return faster to its size before pregnancy. It also slows bleeding (lochia) after you give birth.  Breastfeeding helps to lower your risk of developing type 2 diabetes, osteoporosis, rheumatoid arthritis, cardiovascular disease, and breast, ovarian, uterine, and endometrial cancer later in life. Breastfeeding basics Starting breastfeeding  Find a comfortable place to sit or lie down, with your neck and back well-supported.  Place a pillow or a rolled-up blanket under your baby to bring him or her to the level of your breast (if you are seated). Nursing pillows are specially designed to help support your arms and your baby while you breastfeed.  Make sure that your baby's tummy (abdomen) is facing your abdomen.  Gently massage your breast. With your fingertips, massage from the outer edges of your breast inward toward the nipple. This encourages milk flow. If your milk flows slowly, you may need to continue this action during the feeding.  Support your breast with 4 fingers underneath and your thumb above your nipple (make the letter "C" with your hand). Make sure your fingers are well away from your nipple and your baby's mouth.  Stroke your baby's lips gently with your finger or nipple.  When your baby's mouth is open wide enough, quickly bring your baby to your breast, placing your entire nipple and as much of the areola as possible into your baby's mouth. The areola is the colored area around your nipple. ? More areola should be visible above your  baby's upper lip than below the lower lip. ? Your baby's lips should be opened and extended outward (flanged) to ensure an adequate, comfortable latch. ? Your baby's tongue should be between his or her lower gum and your breast.  Make sure that your baby's mouth is correctly positioned around your nipple (latched). Your baby's lips should create a seal on your breast and be turned out (everted).  It is common for your baby to suck about 2-3 minutes in order to start the flow of breast milk. Latching Teaching your baby how to latch onto your breast properly is very important. An improper latch can cause nipple pain, decreased milk supply, and poor weight gain in your baby. Also, if your baby is not latched onto your nipple properly, he or she may swallow some air during feeding. This can make your baby fussy. Burping your baby when you switch breasts during the feeding can help to get rid of the air. However, teaching your baby to latch on properly is still the best way to prevent fussiness from swallowing air while breastfeeding. Signs that your baby has successfully latched onto   your nipple  Silent tugging or silent sucking, without causing you pain. Infant's lips should be extended outward (flanged).  Swallowing heard between every 3-4 sucks once your milk has started to flow (after your let-down milk reflex occurs).  Muscle movement above and in front of his or her ears while sucking. Signs that your baby has not successfully latched onto your nipple  Sucking sounds or smacking sounds from your baby while breastfeeding.  Nipple pain. If you think your baby has not latched on correctly, slip your finger into the corner of your baby's mouth to break the suction and place it between your baby's gums. Attempt to start breastfeeding again. Signs of successful breastfeeding Signs from your baby  Your baby will gradually decrease the number of sucks or will completely stop sucking.  Your baby  will fall asleep.  Your baby's body will relax.  Your baby will retain a small amount of milk in his or her mouth.  Your baby will let go of your breast by himself or herself. Signs from you  Breasts that have increased in firmness, weight, and size 1-3 hours after feeding.  Breasts that are softer immediately after breastfeeding.  Increased milk volume, as well as a change in milk consistency and color by the fifth day of breastfeeding.  Nipples that are not sore, cracked, or bleeding. Signs that your baby is getting enough milk  Wetting at least 1-2 diapers during the first 24 hours after birth.  Wetting at least 5-6 diapers every 24 hours for the first week after birth. The urine should be clear or pale yellow by the age of 5 days.  Wetting 6-8 diapers every 24 hours as your baby continues to grow and develop.  At least 3 stools in a 24-hour period by the age of 5 days. The stool should be soft and yellow.  At least 3 stools in a 24-hour period by the age of 7 days. The stool should be seedy and yellow.  No loss of weight greater than 10% of birth weight during the first 3 days of life.  Average weight gain of 4-7 oz (113-198 g) per week after the age of 4 days.  Consistent daily weight gain by the age of 5 days, without weight loss after the age of 2 weeks. After a feeding, your baby may spit up a small amount of milk. This is normal. Breastfeeding frequency and duration Frequent feeding will help you make more milk and can prevent sore nipples and extremely full breasts (breast engorgement). Breastfeed when you feel the need to reduce the fullness of your breasts or when your baby shows signs of hunger. This is called "breastfeeding on demand." Signs that your baby is hungry include:  Increased alertness, activity, or restlessness.  Movement of the head from side to side.  Opening of the mouth when the corner of the mouth or cheek is stroked (rooting).  Increased  sucking sounds, smacking lips, cooing, sighing, or squeaking.  Hand-to-mouth movements and sucking on fingers or hands.  Fussing or crying. Avoid introducing a pacifier to your baby in the first 4-6 weeks after your baby is born. After this time, you may choose to use a pacifier. Research has shown that pacifier use during the first year of a baby's life decreases the risk of sudden infant death syndrome (SIDS). Allow your baby to feed on each breast as long as he or she wants. When your baby unlatches or falls asleep while feeding from the   first breast, offer the second breast. Because newborns are often sleepy in the first few weeks of life, you may need to awaken your baby to get him or her to feed. Breastfeeding times will vary from baby to baby. However, the following rules can serve as a guide to help you make sure that your baby is properly fed:  Newborns (babies 4 weeks of age or younger) may breastfeed every 1-3 hours.  Newborns should not go without breastfeeding for longer than 3 hours during the day or 5 hours during the night.  You should breastfeed your baby a minimum of 8 times in a 24-hour period. Breast milk pumping Pumping and storing breast milk allows you to make sure that your baby is exclusively fed your breast milk, even at times when you are unable to breastfeed. This is especially important if you go back to work while you are still breastfeeding, or if you are not able to be present during feedings. Your lactation consultant can help you find a method of pumping that works best for you and give you guidelines about how long it is safe to store breast milk.      Caring for your breasts while you breastfeed Nipples can become dry, cracked, and sore while breastfeeding. The following recommendations can help keep your breasts moisturized and healthy:  Avoid using soap on your nipples.  Wear a supportive bra designed especially for nursing. Avoid wearing underwire-style  bras or extremely tight bras (sports bras).  Air-dry your nipples for 3-4 minutes after each feeding.  Use only cotton bra pads to absorb leaked breast milk. Leaking of breast milk between feedings is normal.  Use lanolin on your nipples after breastfeeding. Lanolin helps to maintain your skin's normal moisture barrier. Pure lanolin is not harmful (not toxic) to your baby. You may also hand express a few drops of breast milk and gently massage that milk into your nipples and allow the milk to air-dry. In the first few weeks after giving birth, some women experience breast engorgement. Engorgement can make your breasts feel heavy, warm, and tender to the touch. Engorgement peaks within 3-5 days after you give birth. The following recommendations can help to ease engorgement:  Completely empty your breasts while breastfeeding or pumping. You may want to start by applying warm, moist heat (in the shower or with warm, water-soaked hand towels) just before feeding or pumping. This increases circulation and helps the milk flow. If your baby does not completely empty your breasts while breastfeeding, pump any extra milk after he or she is finished.  Apply ice packs to your breasts immediately after breastfeeding or pumping, unless this is too uncomfortable for you. To do this: ? Put ice in a plastic bag. ? Place a towel between your skin and the bag. ? Leave the ice on for 20 minutes, 2-3 times a day.  Make sure that your baby is latched on and positioned properly while breastfeeding. If engorgement persists after 48 hours of following these recommendations, contact your health care provider or a lactation consultant. Overall health care recommendations while breastfeeding  Eat 3 healthy meals and 3 snacks every day. Well-nourished mothers who are breastfeeding need an additional 450-500 calories a day. You can meet this requirement by increasing the amount of a balanced diet that you eat.  Drink  enough water to keep your urine pale yellow or clear.  Rest often, relax, and continue to take your prenatal vitamins to prevent fatigue, stress, and low   vitamin and mineral levels in your body (nutrient deficiencies).  Do not use any products that contain nicotine or tobacco, such as cigarettes and e-cigarettes. Your baby may be harmed by chemicals from cigarettes that pass into breast milk and exposure to secondhand smoke. If you need help quitting, ask your health care provider.  Avoid alcohol.  Do not use illegal drugs or marijuana.  Talk with your health care provider before taking any medicines. These include over-the-counter and prescription medicines as well as vitamins and herbal supplements. Some medicines that may be harmful to your baby can pass through breast milk.  It is possible to become pregnant while breastfeeding. If birth control is desired, ask your health care provider about options that will be safe while breastfeeding your baby. Where to find more information: [employer]: www.llli.org Contact a health care provider if:  You feel like you want to stop breastfeeding or have become frustrated with breastfeeding.  Your nipples are cracked or bleeding.  Your breasts are red, tender, or warm.  You have: ? Painful breasts or nipples. ? A swollen area on either breast. ? A fever or chills. ? Nausea or vomiting. ? Drainage other than breast milk from your nipples.  Your breasts do not become full before feedings by the fifth day after you give birth.  You feel sad and depressed.  Your baby is: ? Too sleepy to eat well. ? Having trouble sleeping. ? More than 1 week old and wetting fewer than 6 diapers in a 24-hour period. ? Not gaining weight by 5 days of age.  Your baby has fewer than 3 stools in a 24-hour period.  Your baby's skin or the white parts of his or her eyes become yellow. Get help right away if:  Your baby is overly tired  (lethargic) and does not want to wake up and feed.  Your baby develops an unexplained fever. Summary  Breastfeeding offers many health benefits for infant and mothers.  Try to breastfeed your infant when he or she shows early signs of hunger.  Gently tickle or stroke your baby's lips with your finger or nipple to allow the baby to open his or her mouth. Bring the baby to your breast. Make sure that much of the areola is in your baby's mouth. Offer one side and burp the baby before you offer the other side.  Talk with your health care provider or lactation consultant if you have questions or you face problems as you breastfeed. This information is not intended to replace advice given to you by your health care provider. Make sure you discuss any questions you have with your health care provider. Document Revised: 11/18/2017 Document Reviewed: 09/25/2016 Elsevier Patient Education  2021 Elsevier Inc.  

## 2021-02-13 LAB — CBC
Hematocrit: 34.3 % (ref 34.0–46.6)
Hemoglobin: 11.4 g/dL (ref 11.1–15.9)
MCH: 28 pg (ref 26.6–33.0)
MCHC: 33.2 g/dL (ref 31.5–35.7)
MCV: 84 fL (ref 79–97)
Platelets: 162 10*3/uL (ref 150–450)
RBC: 4.07 x10E6/uL (ref 3.77–5.28)
RDW: 11.8 % (ref 11.7–15.4)
WBC: 6.8 10*3/uL (ref 3.4–10.8)

## 2021-02-13 LAB — CERVICOVAGINAL ANCILLARY ONLY
Bacterial Vaginitis (gardnerella): NEGATIVE
Candida Glabrata: NEGATIVE
Candida Vaginitis: POSITIVE — AB
Chlamydia: NEGATIVE
Comment: NEGATIVE
Comment: NEGATIVE
Comment: NEGATIVE
Comment: NEGATIVE
Comment: NEGATIVE
Comment: NORMAL
Neisseria Gonorrhea: NEGATIVE
Trichomonas: NEGATIVE

## 2021-02-13 LAB — HIV ANTIBODY (ROUTINE TESTING W REFLEX): HIV Screen 4th Generation wRfx: NONREACTIVE

## 2021-02-13 LAB — RPR: RPR Ser Ql: NONREACTIVE

## 2021-02-13 LAB — GLUCOSE TOLERANCE, 2 HOURS W/ 1HR
Glucose, 1 hour: 99 mg/dL (ref 65–179)
Glucose, 2 hour: 84 mg/dL (ref 65–152)
Glucose, Fasting: 70 mg/dL (ref 65–91)

## 2021-02-13 MED ORDER — FLUCONAZOLE 150 MG PO TABS: 150.0000 mg | ORAL_TABLET | Freq: Once | ORAL | 0 refills | Status: AC

## 2021-02-13 NOTE — Addendum Note (Signed)
Addended by: Merian Capron on: 02/13/2021 02:41 PM   Modules accepted: Orders

## 2021-02-26 ENCOUNTER — Ambulatory Visit (INDEPENDENT_AMBULATORY_CARE_PROVIDER_SITE_OTHER): Payer: Medicaid Other | Admitting: Obstetrics and Gynecology

## 2021-02-26 ENCOUNTER — Other Ambulatory Visit: Payer: Self-pay

## 2021-02-26 VITALS — BP 104/69 | HR 76 | Wt 158.0 lb

## 2021-02-26 DIAGNOSIS — Z3480 Encounter for supervision of other normal pregnancy, unspecified trimester: Secondary | ICD-10-CM

## 2021-02-26 DIAGNOSIS — Z3A29 29 weeks gestation of pregnancy: Secondary | ICD-10-CM

## 2021-02-26 DIAGNOSIS — O09292 Supervision of pregnancy with other poor reproductive or obstetric history, second trimester: Secondary | ICD-10-CM

## 2021-02-26 DIAGNOSIS — O09892 Supervision of other high risk pregnancies, second trimester: Secondary | ICD-10-CM

## 2021-02-26 NOTE — Progress Notes (Signed)
   PRENATAL VISIT NOTE  Subjective:  Tracy Guzman is a 25 y.o. [id number] at [redacted]w[redacted]d being seen today for ongoing prenatal care.  She is currently monitored for the following issues for this high-risk pregnancy and has ASTHMA; GERD; BACK PAIN; Suicidal ideation; Polysubstance abuse (HCC); Oppositional defiant disorder; PTSD (post-traumatic stress disorder); MDD (major depressive disorder), recurrent episode, moderate (HCC); Supervision of other normal pregnancy, antepartum; History of preterm delivery, currently pregnant in second trimester; H/O fetal anomaly in prior pregnancy, currently pregnant, second trimester; Back pain affecting pregnancy in second trimester; and Headache in pregnancy, antepartum, second trimester on their problem list.  Patient reports fatigue.  Contractions: Not present. Vag. Bleeding: None.  Movement: Present. Denies leaking of fluid.   The following portions of the patient's history were reviewed and updated as appropriate: allergies, current medications, past family history, past medical history, past social history, past surgical history and problem list.   Objective:   Vitals:   02/26/21 0826  BP: 104/69  Pulse: 76  Weight: 158 lb (71.7 kg)    Fetal Status: Fetal Heart Rate (bpm): 154   Movement: Present     General:  Alert, oriented and cooperative. Patient is in no acute distress.  Skin: Skin is warm and dry. No rash noted.   Cardiovascular: Normal heart rate noted  Respiratory: Normal respiratory effort, no problems with respiration noted  Abdomen: Soft, gravid, appropriate for gestational age.  Pain/Pressure: Absent     Pelvic: Cervical exam deferred        Extremities: Normal range of motion.  Edema: None  Mental Status: Normal mood and affect. Normal behavior. Normal judgment and thought content.   Assessment and Plan:  Pregnancy: [id number] at [redacted]w[redacted]d  1. Supervision of other normal pregnancy, antepartum  2. History of preterm delivery, currently  pregnant in second trimester PTL at 33 weeks Not on Makena  3. H/O fetal anomaly in prior pregnancy, currently pregnant, second trimester Normal echo  4. [redacted] weeks gestation of pregnancy   Preterm labor symptoms and general obstetric precautions including but not limited to vaginal bleeding, contractions, leaking of fluid and fetal movement were reviewed in detail with the patient. Please refer to After Visit Summary for other counseling recommendations.   Return in about 2 weeks (around 03/12/2021) for high OB, in person.  Future Appointments  Date Time Provider Department Center  03/06/2021  9:00 AM [facility] NURSE [facility]  03/06/2021  9:15 AM [facility] US[facility]    Conan Bowens, MD

## 2021-03-01 ENCOUNTER — Encounter (HOSPITAL_COMMUNITY): Payer: Self-pay | Admitting: Obstetrics & Gynecology

## 2021-03-01 ENCOUNTER — Other Ambulatory Visit: Payer: Self-pay

## 2021-03-01 ENCOUNTER — Inpatient Hospital Stay (HOSPITAL_COMMUNITY)
Admission: AD | Admit: 2021-03-01 | Discharge: 2021-03-01 | Disposition: A | Discharge status: Home / Self Care | Payer: Medicaid Other | Attending: Obstetrics & Gynecology | Admitting: Obstetrics & Gynecology

## 2021-03-01 DIAGNOSIS — R102 Pelvic and perineal pain: Secondary | ICD-10-CM | POA: Insufficient documentation

## 2021-03-01 DIAGNOSIS — O368130 Decreased fetal movements, third trimester, not applicable or unspecified: Secondary | ICD-10-CM | POA: Diagnosis not present

## 2021-03-01 DIAGNOSIS — Z3A30 30 weeks gestation of pregnancy: Secondary | ICD-10-CM | POA: Insufficient documentation

## 2021-03-01 DIAGNOSIS — O99891 Other specified diseases and conditions complicating pregnancy: Secondary | ICD-10-CM | POA: Diagnosis not present

## 2021-03-01 DIAGNOSIS — Z87891 Personal history of nicotine dependence: Secondary | ICD-10-CM | POA: Insufficient documentation

## 2021-03-01 DIAGNOSIS — M549 Dorsalgia, unspecified: Secondary | ICD-10-CM | POA: Diagnosis not present

## 2021-03-01 DIAGNOSIS — Z3480 Encounter for supervision of other normal pregnancy, unspecified trimester: Secondary | ICD-10-CM

## 2021-03-01 DIAGNOSIS — Z79899 Other long term (current) drug therapy: Secondary | ICD-10-CM | POA: Insufficient documentation

## 2021-03-01 DIAGNOSIS — O4703 False labor before 37 completed weeks of gestation, third trimester: Secondary | ICD-10-CM | POA: Insufficient documentation

## 2021-03-01 DIAGNOSIS — O479 False labor, unspecified: Secondary | ICD-10-CM

## 2021-03-01 DIAGNOSIS — O26893 Other specified pregnancy related conditions, third trimester: Secondary | ICD-10-CM | POA: Diagnosis not present

## 2021-03-01 LAB — URINALYSIS, ROUTINE W REFLEX MICROSCOPIC
Bilirubin Urine: NEGATIVE
Glucose, UA: NEGATIVE mg/dL
Hgb urine dipstick: NEGATIVE
Ketones, ur: NEGATIVE mg/dL
Leukocytes,Ua: NEGATIVE
Nitrite: NEGATIVE
Protein, ur: NEGATIVE mg/dL
Specific Gravity, Urine: 1.013 (ref 1.005–1.030)
pH: 7 (ref 5.0–8.0)

## 2021-03-01 LAB — WET PREP, GENITAL
Clue Cells Wet Prep HPF POC: NONE SEEN
Sperm: NONE SEEN
Trich, Wet Prep: NONE SEEN
Yeast Wet Prep HPF POC: NONE SEEN

## 2021-03-01 LAB — FETAL FIBRONECTIN: Fetal Fibronectin: NEGATIVE

## 2021-03-01 MED ORDER — NIFEDIPINE 10 MG PO CAPS
10.0000 mg | ORAL_CAPSULE | ORAL | Status: AC
  Administered 2021-03-01: 10 mg via ORAL
  Filled 2021-03-01 (×2): qty 1

## 2021-03-01 MED ORDER — ACETAMINOPHEN 500 MG PO TABS
1000.0000 mg | ORAL_TABLET | Freq: Once | ORAL | Status: AC
  Administered 2021-03-01: 1000 mg via ORAL
  Filled 2021-03-01: qty 2

## 2021-03-01 MED ORDER — CYCLOBENZAPRINE HCL 10 MG PO TABS: 10.0000 mg | ORAL_TABLET | Freq: Three times a day (TID) | ORAL | 1 refills | Status: DC | PRN

## 2021-03-01 MED ORDER — CYCLOBENZAPRINE HCL 5 MG PO TABS
5.0000 mg | ORAL_TABLET | Freq: Once | ORAL | Status: AC
  Administered 2021-03-01: 5 mg via ORAL
  Filled 2021-03-01: qty 1

## 2021-03-01 NOTE — MAU Note (Signed)
Ports she has had increased pelvic pressure, back pain  and cramping for the past 3 -4 hours. Fetal movement is decreased. Denies any vag bleeding or leaking at this time.

## 2021-03-01 NOTE — MAU Provider Note (Signed)
Chief Complaint:  Decreased Fetal Movement and Pelvic Pain   Event Date/Time   First Provider Initiated Contact with Patient 03/01/21 1955     HPI: Tracy Guzman is a 25 y.o. [id number] at [redacted]w[redacted]d who presents to maternity admissions reporting cramping. Patient reports lower abdominal cramping, back pain, and tightening in stomach that started around 3:30pm. Says she tried to lay down and rest as well as drink some water, however the pain has worsened. She also reports decreased fetal movement today, although has felt multiple fetal kicks since her arrival to MAU. She denies vaginal bleeding, leaking fluid, discharge, or urinary s/s. Denies recent IC or cervical exams.    Pregnancy Course:   Past Medical History:  Diagnosis Date   Allergy    Smoke, dust, Pollen   Asthma    Asthma    Depression    Endometriosis    GERD (gastroesophageal reflux disease)    OB History  Gravida Para Term Preterm AB Living  3 2 1 1   2   SAB IAB Ectopic Multiple Live Births          2    # Outcome Date GA Lbr Len/2nd Weight Sex Delivery Anes PTL Lv  3 Current           2 Preterm 04/23/16 [redacted]w[redacted]d   M Vag-Spont   LIV  1 Term 06/01/15 [redacted]w[redacted]d   F Vag-Spont   LIV   Past Surgical History:  Procedure Laterality Date   MYRINGOTOMY     TONSILLECTOMY AND ADENOIDECTOMY  as infant   WRIST SURGERY  age 68   Family History  Problem Relation Age of Onset   Hypertension Mother    Hypertension Father    Depression Mother    Hyperlipidemia Mother    Depression Maternal Grandmother    Drug abuse Cousin    Drug abuse Maternal Uncle    Hyperlipidemia Father    Social History   Tobacco Use   Smoking status: Former    Packs/day: 1.00    Years: 1.00    Pack years: 1.00    Types: Cigarettes   Smokeless tobacco: Never  Vaping Use   Vaping Use: Never used  Substance Use Topics   Alcohol use: Not Currently    Comment: not since confirmed pregnancy   Drug use: Not Currently    Types: Other-see comments,  MDMA (Ecstacy), Marijuana    Comment: last used end of December   Allergies  Allergen Reactions   Lamotrigine Rash   Medications Prior to Admission  Medication Sig Dispense Refill Last Dose   albuterol (VENTOLIN HFA) 108 (90 Base) MCG/ACT inhaler Inhale 1-2 puffs into the lungs every 6 (six) hours as needed for wheezing or shortness of breath. 18 g 0 02/28/2021   Blood Pressure Monitor DEVI Please check blood pressure 1-2 per week 1 each 0 03/01/2021   prenatal vitamin w/FE, FA (PRENATAL 1 + 1) 27-1 MG TABS tablet Take 1 tablet by mouth daily at 12 noon.   03/01/2021   [DISCONTINUED] cyclobenzaprine (FLEXERIL) 10 MG tablet Take 1 tablet (10 mg total) by mouth 3 (three) times daily as needed for muscle spasms. 30 tablet 1 Past Week   Butalbital-APAP-Caffeine 50-325-40 MG capsule Take 1-2 capsules by mouth every 6 (six) hours as needed for headache. 30 capsule 0 More than a month   metroNIDAZOLE (FLAGYL) 500 MG tablet Take 1 tablet (500 mg total) by mouth 2 (two) times daily. (Patient not taking: No sig reported) 14 tablet 0  Unknown   metroNIDAZOLE (METROGEL) 0.75 % vaginal gel Place 1 Applicatorful vaginally at bedtime. Apply one applicatorful to vagina at bedtime for 5 days (Patient not taking: No sig reported) 70 g 0 Unknown   terconazole (TERAZOL 3) 0.8 % vaginal cream Place 1 applicator vaginally at bedtime. Apply nightly for three nights. (Patient not taking: No sig reported) 20 g 0 Unknown   I have reviewed patient's Past Medical Hx, Surgical Hx, Family Hx, Social Hx, medications and allergies.   ROS:  Review of Systems  Constitutional: Negative.   Respiratory: Negative.    Cardiovascular: Negative.   Gastrointestinal:  Positive for abdominal pain.       Cramping   Genitourinary:  Positive for pelvic pain. Negative for vaginal bleeding and vaginal discharge.  Musculoskeletal:  Positive for back pain.  Neurological: Negative.   Psychiatric/Behavioral: Negative.     Physical Exam   Patient Vitals for the past 24 hrs:  BP Temp Pulse Resp Height Weight  03/01/21 2038 97/64 -- -- -- -- --  03/01/21 2015 104/60 -- -- -- -- --  03/01/21 1916 123/68 97.9 F (36.6 C) 79 18 5\' 4"  (1.626 m) 72.1 kg   Constitutional: well-developed, well-nourished female in no acute distress.  Cardiovascular: normal rate Respiratory: normal effort GI: abd soft, non-tender, gravid MS: extremities nontender, no edema, normal ROM Neurologic: alert and oriented x 4.  GU: neg CVAT. Pelvic: deferred, FFN and blind swabs obtained Cervix: closed/thick/ballotable  FHT: Baseline 135 bpm, moderate variability, +accels, no decels Toco: q 2-5 mins   Labs: Results for orders placed or performed during the hospital encounter of 03/01/21 (from the past 24 hour(s))  Urinalysis, Routine w reflex microscopic Urine, Clean Catch     Status: Abnormal   Collection Time: 03/01/21  7:31 PM  Result Value Ref Range   Color, Urine YELLOW YELLOW   APPearance HAZY (A) CLEAR   Specific Gravity, Urine 1.013 1.005 - 1.030   pH 7.0 5.0 - 8.0   Glucose, UA NEGATIVE NEGATIVE mg/dL   Hgb urine dipstick NEGATIVE NEGATIVE   Bilirubin Urine NEGATIVE NEGATIVE   Ketones, ur NEGATIVE NEGATIVE mg/dL   Protein, ur NEGATIVE NEGATIVE mg/dL   Nitrite NEGATIVE NEGATIVE   Leukocytes,Ua NEGATIVE NEGATIVE  Fetal fibronectin     Status: None   Collection Time: 03/01/21  8:00 PM  Result Value Ref Range   Fetal Fibronectin NEGATIVE NEGATIVE  Wet prep, genital     Status: Abnormal   Collection Time: 03/01/21  8:00 PM  Result Value Ref Range   Yeast Wet Prep HPF POC NONE SEEN NONE SEEN   Trich, Wet Prep NONE SEEN NONE SEEN   Clue Cells Wet Prep HPF POC NONE SEEN NONE SEEN   WBC, Wet Prep HPF POC MODERATE (A) NONE SEEN   Sperm NONE SEEN     Imaging:  No results found.  MAU Course: Orders Placed This Encounter  Procedures   Wet prep, genital   Urinalysis, Routine w reflex microscopic Urine, Clean Catch   Fetal  fibronectin   Discharge patient   Meds ordered this encounter  Medications   NIFEdipine (PROCARDIA) capsule 10 mg   acetaminophen (TYLENOL) tablet 1,000 mg   cyclobenzaprine (FLEXERIL) tablet 5 mg   cyclobenzaprine (FLEXERIL) 10 MG tablet    Sig: Take 1 tablet (10 mg total) by mouth 3 (three) times daily as needed for muscle spasms.    Dispense:  30 tablet    Refill:  1    Order Specific  Question:   Supervising Provider    Answer:   Samara Snide    MDM: UA unremarkable FFN negative Wet prep unremarkable GC/CT pending Procardia series and PO hydration, however only 1 dose of Procardia given d/t patient BP Patient reports contractions improved after 1 dose and PO hydration, however continues to report back pain Contractions spaced out on Toco, only occasional ui NST reassuring for gestational age Tylenol 1000mg  and Flexeril 5mg  given Cervix closed/thick/ballotable, remains unchanged after >1hr Recommend Tylenol/Flexeril prn, heat/ice, stretching, and maternity support band  Assessment: 1. Supervision of other normal pregnancy, antepartum   2. [redacted] weeks gestation of pregnancy   3. Braxton Hick's contraction   4. Back pain affecting pregnancy in second trimester   5. Back pain affecting pregnancy in third trimester     Plan: Discharge home in stable condition  Preterm labor precautions and fetal kick counts Keep OB appointment as scheduled Return to MAU as needed for emergencies    Follow-up Information     [facility] Follow up.   Specialty: Obstetrics and Gynecology Contact information: [address] [place] [address] [place] [phone]                Allergies as of 03/01/2021       Reactions   Lamotrigine Rash        Medication List     STOP taking these medications    metroNIDAZOLE 0.75 % vaginal gel Commonly known as: METROGEL   metroNIDAZOLE 500 MG tablet Commonly known as:  Flagyl   terconazole 0.8 % vaginal cream Commonly known as: TERAZOL 3       TAKE these medications    albuterol 108 (90 Base) MCG/ACT inhaler Commonly known as: VENTOLIN HFA Inhale 1-2 puffs into the lungs every 6 (six) hours as needed for wheezing or shortness of breath.   Blood Pressure Monitor Devi Please check blood pressure 1-2 per week   Butalbital-APAP-Caffeine 50-325-40 MG capsule Take 1-2 capsules by mouth every 6 (six) hours as needed for headache.   cyclobenzaprine 10 MG tablet Commonly known as: FLEXERIL Take 1 tablet (10 mg total) by mouth 3 (three) times daily as needed for muscle spasms.   prenatal vitamin w/FE, FA 27-1 MG Tabs tablet Take 1 tablet by mouth daily at 12 noon.         [phone], MSN, CNM 03/01/2021 9:30 PM

## 2021-03-03 LAB — GC/CHLAMYDIA PROBE AMP (~~LOC~~) NOT AT ARMC
Chlamydia: NEGATIVE
Comment: NEGATIVE
Comment: NORMAL
Neisseria Gonorrhea: NEGATIVE

## 2021-03-06 ENCOUNTER — Ambulatory Visit: Payer: Medicaid Other | Admitting: *Deleted

## 2021-03-06 ENCOUNTER — Other Ambulatory Visit: Payer: Self-pay

## 2021-03-06 ENCOUNTER — Ambulatory Visit: Payer: Medicaid Other | Attending: Obstetrics

## 2021-03-06 ENCOUNTER — Encounter: Payer: Self-pay | Admitting: *Deleted

## 2021-03-06 VITALS — BP 120/61 | HR 82

## 2021-03-06 DIAGNOSIS — O09213 Supervision of pregnancy with history of pre-term labor, third trimester: Secondary | ICD-10-CM

## 2021-03-06 DIAGNOSIS — O359XX0 Maternal care for (suspected) fetal abnormality and damage, unspecified, not applicable or unspecified: Secondary | ICD-10-CM | POA: Diagnosis not present

## 2021-03-06 DIAGNOSIS — Z3A30 30 weeks gestation of pregnancy: Secondary | ICD-10-CM

## 2021-03-06 DIAGNOSIS — F32A Depression, unspecified: Secondary | ICD-10-CM

## 2021-03-06 DIAGNOSIS — O99343 Other mental disorders complicating pregnancy, third trimester: Secondary | ICD-10-CM | POA: Diagnosis not present

## 2021-03-06 DIAGNOSIS — Z3480 Encounter for supervision of other normal pregnancy, unspecified trimester: Secondary | ICD-10-CM

## 2021-03-06 DIAGNOSIS — O99513 Diseases of the respiratory system complicating pregnancy, third trimester: Secondary | ICD-10-CM

## 2021-03-06 DIAGNOSIS — J45909 Unspecified asthma, uncomplicated: Secondary | ICD-10-CM

## 2021-03-06 DIAGNOSIS — F1910 Other psychoactive substance abuse, uncomplicated: Secondary | ICD-10-CM | POA: Diagnosis not present

## 2021-03-06 DIAGNOSIS — Z362 Encounter for other antenatal screening follow-up: Secondary | ICD-10-CM | POA: Diagnosis not present

## 2021-03-12 ENCOUNTER — Other Ambulatory Visit (HOSPITAL_COMMUNITY)
Admission: RE | Admit: 2021-03-12 | Discharge: 2021-03-12 | Disposition: A | Discharge status: Home / Self Care | Payer: Medicaid Other | Source: Ambulatory Visit | Attending: Obstetrics and Gynecology | Admitting: Obstetrics and Gynecology

## 2021-03-12 ENCOUNTER — Ambulatory Visit (INDEPENDENT_AMBULATORY_CARE_PROVIDER_SITE_OTHER): Payer: Medicaid Other | Admitting: Obstetrics and Gynecology

## 2021-03-12 ENCOUNTER — Other Ambulatory Visit: Payer: Self-pay

## 2021-03-12 ENCOUNTER — Encounter: Payer: Self-pay | Admitting: Obstetrics and Gynecology

## 2021-03-12 VITALS — BP 107/70 | HR 92 | Wt 160.0 lb

## 2021-03-12 DIAGNOSIS — N898 Other specified noninflammatory disorders of vagina: Secondary | ICD-10-CM | POA: Insufficient documentation

## 2021-03-12 DIAGNOSIS — Z3480 Encounter for supervision of other normal pregnancy, unspecified trimester: Secondary | ICD-10-CM

## 2021-03-12 DIAGNOSIS — O09892 Supervision of other high risk pregnancies, second trimester: Secondary | ICD-10-CM

## 2021-03-12 NOTE — Progress Notes (Signed)
   PRENATAL VISIT NOTE  Subjective:  Tracy Guzman is a 25 y.o. A4Z6606 at [redacted]w[redacted]d being seen today for ongoing prenatal care.  She is currently monitored for the following issues for this high-risk pregnancy and has ASTHMA; GERD; BACK PAIN; Suicidal ideation; Polysubstance abuse (HCC); Oppositional defiant disorder; PTSD (post-traumatic stress disorder); MDD (major depressive disorder), recurrent episode, moderate (HCC); Supervision of other normal pregnancy, antepartum; History of preterm delivery, currently pregnant in second trimester; H/O fetal anomaly in prior pregnancy, currently pregnant, second trimester; Back pain affecting pregnancy in second trimester; and Headache in pregnancy, antepartum, second trimester on their problem list.  Patient reports no complaints.  Contractions: Not present. Vag. Bleeding: None.  Movement: Present. Denies leaking of fluid.   The following portions of the patient's history were reviewed and updated as appropriate: allergies, current medications, past family history, past medical history, past social history, past surgical history and problem list.   Objective:   Vitals:   03/12/21 1004  BP: 107/70  Pulse: 92  Weight: 160 lb (72.6 kg)    Fetal Status: Fetal Heart Rate (bpm): 144 Fundal Height: 32 cm Movement: Present     General:  Alert, oriented and cooperative. Patient is in no acute distress.  Skin: Skin is warm and dry. No rash noted.   Cardiovascular: Normal heart rate noted  Respiratory: Normal respiratory effort, no problems with respiration noted  Abdomen: Soft, gravid, appropriate for gestational age.  Pain/Pressure: Present     Pelvic: Cervical exam deferred        Extremities: Normal range of motion.  Edema: None  Mental Status: Normal mood and affect. Normal behavior. Normal judgment and thought content.   Assessment and Plan:  Pregnancy: G3P1102 at [redacted]w[redacted]d 1. Supervision of other normal pregnancy, antepartum Patient is doing  well without complaints Patient is considering patch vs depo-provera for contraception depending on how breastfeeding is going  2. Vaginal discharge Vaginal swab collected to rule out yeast and BV - Cervicovaginal ancillary only( [place])  3. History of preterm delivery, currently pregnant in second trimester Not on 17-P  Preterm labor symptoms and general obstetric precautions including but not limited to vaginal bleeding, contractions, leaking of fluid and fetal movement were reviewed in detail with the patient. Please refer to After Visit Summary for other counseling recommendations.   Return in about 2 weeks (around 03/26/2021) for in person, ROB, Low risk.  Future Appointments  Date Time Provider Department Center  03/12/2021 10:30 AM Azyiah Bo, Gigi Gin, MD [facility] None    Catalina Antigua, MD

## 2021-03-12 NOTE — Progress Notes (Signed)
ROB 31wks  CC: pt requesting self swab due to itching/irritation had swab done on 03/01/21 at MAU pt states she did not have sx's. GC/CT was Negative.

## 2021-03-13 ENCOUNTER — Other Ambulatory Visit: Payer: Self-pay | Admitting: Obstetrics and Gynecology

## 2021-03-13 LAB — CERVICOVAGINAL ANCILLARY ONLY
Bacterial Vaginitis (gardnerella): NEGATIVE
Candida Glabrata: NEGATIVE
Candida Vaginitis: POSITIVE — AB
Comment: NEGATIVE
Comment: NEGATIVE
Comment: NEGATIVE

## 2021-03-13 MED ORDER — TERCONAZOLE 0.8 % VA CREA: 1.0000 | TOPICAL_CREAM | Freq: Every day | VAGINAL | 0 refills | Status: DC

## 2021-03-13 NOTE — Addendum Note (Signed)
Addended by: Catalina Antigua on: 03/13/2021 11:40 AM   Modules accepted: Orders

## 2021-03-26 ENCOUNTER — Ambulatory Visit (INDEPENDENT_AMBULATORY_CARE_PROVIDER_SITE_OTHER): Payer: Medicaid Other | Admitting: Family Medicine

## 2021-03-26 ENCOUNTER — Encounter: Payer: Self-pay | Admitting: Family Medicine

## 2021-03-26 ENCOUNTER — Other Ambulatory Visit: Payer: Self-pay

## 2021-03-26 VITALS — BP 100/65 | HR 69 | Wt 161.0 lb

## 2021-03-26 DIAGNOSIS — O09892 Supervision of other high risk pregnancies, second trimester: Secondary | ICD-10-CM

## 2021-03-26 DIAGNOSIS — Z3480 Encounter for supervision of other normal pregnancy, unspecified trimester: Secondary | ICD-10-CM

## 2021-03-26 NOTE — Patient Instructions (Signed)

## 2021-03-26 NOTE — Progress Notes (Signed)
   Subjective:  Tracy Guzman is a 25 y.o. X3K4401 at [redacted]w[redacted]d being seen today for ongoing prenatal care.  She is currently monitored for the following issues for this low-risk pregnancy and has ASTHMA; GERD; BACK PAIN; Suicidal ideation; Polysubstance abuse (HCC); Oppositional defiant disorder; PTSD (post-traumatic stress disorder); MDD (major depressive disorder), recurrent episode, moderate (HCC); Supervision of other normal pregnancy, antepartum; History of preterm delivery, currently pregnant in second trimester; H/O fetal anomaly in prior pregnancy, currently pregnant, second trimester; Back pain affecting pregnancy in second trimester; and Headache in pregnancy, antepartum, second trimester on their problem list.  Patient reports  pressure and discomfort .  Contractions: Not present. Vag. Bleeding: None.  Movement: Present. Denies leaking of fluid.   The following portions of the patient's history were reviewed and updated as appropriate: allergies, current medications, past family history, past medical history, past social history, past surgical history and problem list. Problem list updated.  Objective:   Vitals:   03/26/21 1426  BP: 100/65  Pulse: 69  Weight: 161 lb (73 kg)    Fetal Status: Fetal Heart Rate (bpm): 143   Movement: Present     General:  Alert, oriented and cooperative. Patient is in no acute distress.  Skin: Skin is warm and dry. No rash noted.   Cardiovascular: Normal heart rate noted  Respiratory: Normal respiratory effort, no problems with respiration noted  Abdomen: Soft, gravid, appropriate for gestational age. Pain/Pressure: Present     Pelvic: Vag. Bleeding: None     Cervical exam deferred        Extremities: Normal range of motion.  Edema: None  Mental Status: Normal mood and affect. Normal behavior. Normal judgment and thought content.   Urinalysis:      Assessment and Plan:  Pregnancy: G3P1102 at [redacted]w[redacted]d  1. Supervision of other normal pregnancy,  antepartum BP and FHR normal Very uncomfortable, would like 39wk IOL  2. History of preterm delivery, currently pregnant in second trimester Not on makena  Preterm labor symptoms and general obstetric precautions including but not limited to vaginal bleeding, contractions, leaking of fluid and fetal movement were reviewed in detail with the patient. Please refer to After Visit Summary for other counseling recommendations.  Return in 2 weeks (on 04/09/2021) for [facility], ob visit.   Venora Maples, MD

## 2021-03-26 NOTE — Progress Notes (Signed)
+   fetal movement. No complaints.  

## 2021-03-30 ENCOUNTER — Encounter (HOSPITAL_COMMUNITY): Payer: Self-pay | Admitting: Obstetrics and Gynecology

## 2021-03-30 ENCOUNTER — Other Ambulatory Visit: Payer: Self-pay

## 2021-03-30 ENCOUNTER — Inpatient Hospital Stay (HOSPITAL_COMMUNITY)
Admission: AD | Admit: 2021-03-30 | Discharge: 2021-03-30 | Disposition: A | Discharge status: Home / Self Care | Payer: Medicaid Other | Attending: Obstetrics & Gynecology | Admitting: Obstetrics & Gynecology

## 2021-03-30 DIAGNOSIS — R42 Dizziness and giddiness: Secondary | ICD-10-CM | POA: Diagnosis not present

## 2021-03-30 DIAGNOSIS — Z8249 Family history of ischemic heart disease and other diseases of the circulatory system: Secondary | ICD-10-CM | POA: Diagnosis not present

## 2021-03-30 DIAGNOSIS — R110 Nausea: Secondary | ICD-10-CM | POA: Insufficient documentation

## 2021-03-30 DIAGNOSIS — Z79899 Other long term (current) drug therapy: Secondary | ICD-10-CM | POA: Insufficient documentation

## 2021-03-30 DIAGNOSIS — O26893 Other specified pregnancy related conditions, third trimester: Secondary | ICD-10-CM

## 2021-03-30 DIAGNOSIS — Z3A34 34 weeks gestation of pregnancy: Secondary | ICD-10-CM | POA: Insufficient documentation

## 2021-03-30 DIAGNOSIS — O23593 Infection of other part of genital tract in pregnancy, third trimester: Secondary | ICD-10-CM | POA: Diagnosis not present

## 2021-03-30 DIAGNOSIS — B9689 Other specified bacterial agents as the cause of diseases classified elsewhere: Secondary | ICD-10-CM | POA: Diagnosis not present

## 2021-03-30 DIAGNOSIS — Z87891 Personal history of nicotine dependence: Secondary | ICD-10-CM | POA: Diagnosis not present

## 2021-03-30 DIAGNOSIS — N760 Acute vaginitis: Secondary | ICD-10-CM

## 2021-03-30 DIAGNOSIS — O479 False labor, unspecified: Secondary | ICD-10-CM

## 2021-03-30 DIAGNOSIS — O4703 False labor before 37 completed weeks of gestation, third trimester: Secondary | ICD-10-CM | POA: Diagnosis not present

## 2021-03-30 DIAGNOSIS — Z3480 Encounter for supervision of other normal pregnancy, unspecified trimester: Secondary | ICD-10-CM

## 2021-03-30 LAB — URINALYSIS, ROUTINE W REFLEX MICROSCOPIC
Bilirubin Urine: NEGATIVE
Glucose, UA: NEGATIVE mg/dL
Hgb urine dipstick: NEGATIVE
Ketones, ur: NEGATIVE mg/dL
Nitrite: NEGATIVE
Protein, ur: NEGATIVE mg/dL
Specific Gravity, Urine: 1.006 (ref 1.005–1.030)
pH: 7 (ref 5.0–8.0)

## 2021-03-30 LAB — WET PREP, GENITAL
Sperm: NONE SEEN
Trich, Wet Prep: NONE SEEN
Yeast Wet Prep HPF POC: NONE SEEN

## 2021-03-30 LAB — FETAL FIBRONECTIN: Fetal Fibronectin: NEGATIVE

## 2021-03-30 MED ORDER — TERBUTALINE SULFATE 1 MG/ML IJ SOLN
0.2500 mg | Freq: Once | INTRAMUSCULAR | Status: AC
  Administered 2021-03-30: 0.25 mg via SUBCUTANEOUS
  Filled 2021-03-30: qty 1

## 2021-03-30 MED ORDER — LACTATED RINGERS IV BOLUS
1000.0000 mL | Freq: Once | INTRAVENOUS | Status: AC
  Administered 2021-03-30: 1000 mL via INTRAVENOUS

## 2021-03-30 MED ORDER — CYCLOBENZAPRINE HCL 5 MG PO TABS
10.0000 mg | ORAL_TABLET | Freq: Once | ORAL | Status: AC
  Administered 2021-03-30: 10 mg via ORAL
  Filled 2021-03-30: qty 2

## 2021-03-30 MED ORDER — METRONIDAZOLE 500 MG PO TABS: 500.0000 mg | ORAL_TABLET | Freq: Two times a day (BID) | ORAL | 0 refills | Status: DC

## 2021-03-30 MED ORDER — ACETAMINOPHEN 500 MG PO TABS
1000.0000 mg | ORAL_TABLET | Freq: Once | ORAL | Status: AC
  Administered 2021-03-30: 1000 mg via ORAL
  Filled 2021-03-30: qty 2

## 2021-03-30 MED ORDER — NIFEDIPINE 10 MG PO CAPS: 10.0000 mg | ORAL_CAPSULE | ORAL | Status: DC | PRN

## 2021-03-30 NOTE — MAU Note (Signed)
Pt reports uterus is feeling more relaxed but reports contractions continue. Toco placement checked.

## 2021-03-30 NOTE — MAU Provider Note (Signed)
History     CSN: [id number]  Arrival date and time: 03/30/21 1749   Event Date/Time   First Provider Initiated Contact with Patient 03/30/21 1928      Chief Complaint  Patient presents with   Abdominal Pain   pelvic pressure   Nausea   Dizziness   HPI Raffaella Edison is a 25 y.o. [id number] at [redacted]w[redacted]d who presents to MAU with chief complaints of preterm contractions and pelvic pressure. This is a recurrent problem, intensifying over time. Patient denies vaginal bleeding, leaking of fluid, decreased fetal movement, fever, falls, or recent illness.   Patient also c/o nausea and dizziness. She denies activity intolerance, weakness, syncope.  Patient [facility] with [facility].   OB History     Gravida  3   Para  2   Term  1   Preterm  1   AB      Living  2      SAB      IAB      Ectopic      Multiple      Live Births  2           Past Medical History:  Diagnosis Date   Allergy    Smoke, dust, Pollen   Asthma    Asthma    Depression    Endometriosis    GERD (gastroesophageal reflux disease)     Past Surgical History:  Procedure Laterality Date   MYRINGOTOMY     TONSILLECTOMY AND ADENOIDECTOMY  as infant   WRIST SURGERY  age 69    Family History  Problem Relation Age of Onset   Hypertension Mother    Hypertension Father    Depression Mother    Hyperlipidemia Mother    Depression Maternal Grandmother    Drug abuse Cousin    Drug abuse Maternal Uncle    Hyperlipidemia Father     Social History   Tobacco Use   Smoking status: Former    Packs/day: 1.00    Years: 1.00    Pack years: 1.00    Types: Cigarettes   Smokeless tobacco: Never  Vaping Use   Vaping Use: Never used  Substance Use Topics   Alcohol use: Not Currently    Comment: not since confirmed pregnancy   Drug use: Not Currently    Types: Other-see comments, MDMA (Ecstacy), Marijuana    Comment: last used end of December    Allergies:  Allergies  Allergen  Reactions   Lamotrigine Rash    Medications Prior to Admission  Medication Sig Dispense Refill Last Dose   albuterol (VENTOLIN HFA) 108 (90 Base) MCG/ACT inhaler Inhale 1-2 puffs into the lungs every 6 (six) hours as needed for wheezing or shortness of breath. 18 g 0 Past Week   Butalbital-APAP-Caffeine 50-325-40 MG capsule Take 1-2 capsules by mouth every 6 (six) hours as needed for headache. 30 capsule 0 Past Month   cyclobenzaprine (FLEXERIL) 10 MG tablet Take 1 tablet (10 mg total) by mouth 3 (three) times daily as needed for muscle spasms. 30 tablet 1 Past Month   prenatal vitamin w/FE, FA (PRENATAL 1 + 1) 27-1 MG TABS tablet Take 1 tablet by mouth daily at 12 noon.   03/30/2021   Blood Pressure Monitor DEVI Please check blood pressure 1-2 per week 1 each 0     Review of Systems  Gastrointestinal:  Positive for abdominal pain.  All other systems reviewed and are negative. Physical Exam   Blood pressure  108/70, pulse 84, temperature 98.1 F (36.7 C), temperature source Oral, resp. rate 17, height 5' 4.5" (1.638 m), weight 74.4 kg, last menstrual period 08/02/2020, SpO2 100 %.  Physical Exam Vitals and nursing note reviewed. Exam conducted with a chaperone present.  Constitutional:      Appearance: She is well-developed.  Cardiovascular:     Rate and Rhythm: Normal rate.     Heart sounds: Normal heart sounds.  Pulmonary:     Effort: Pulmonary effort is normal.  Abdominal:     Comments: Gravid  Neurological:     Mental Status: She is alert.    MAU Course  Procedures  --OB history 1 term SVD, 1 Preterm SVD, not on Makena this pregnancy --FFN collected, cervix visually closed, thin white discharge throughout vault. Closed cervix confirmed with digital exam --Early Bartholin's Gland cyst on patient's right side. Not appropriated for I&D. Sitz bath and compresses advised --Reactive tracing: baseline 125, mod var, + accels, no decels --Toco: irregular contraction q 3-11  min --Procardia held due to blood pressure --CNM at bedside at 1930, marginal improvement in pain score. Terb ordered --Cervix remains closed 3 hours after initial exam --Pain improved from 6/10 to 3/10 with treatments given in MAU. Pt advised to take existing prescription of Flexeril when she gets home PRN  Orders Placed This Encounter  Procedures   Wet prep, genital   Urinalysis, Routine w reflex microscopic Urine, Clean Catch   Fetal fibronectin   Insert peripheral IV   Discharge patient   Patient Vitals for the past 24 hrs:  BP Temp Temp src Pulse Resp SpO2 Height Weight  03/30/21 1936 108/70 -- -- 84 17 -- -- --  03/30/21 1935 -- -- -- -- -- 100 % -- --  03/30/21 1856 (!) 108/55 -- -- 85 -- -- -- --  03/30/21 1800 (!) 110/58 98.1 F (36.7 C) Oral 85 18 99 % 5' 4.5" (1.638 m) 74.4 kg    Results for orders placed or performed during the hospital encounter of 03/30/21 (from the past 24 hour(s))  Urinalysis, Routine w reflex microscopic Urine, Clean Catch     Status: Abnormal   Collection Time: 03/30/21  6:00 PM  Result Value Ref Range   Color, Urine YELLOW YELLOW   APPearance HAZY (A) CLEAR   Specific Gravity, Urine 1.006 1.005 - 1.030   pH 7.0 5.0 - 8.0   Glucose, UA NEGATIVE NEGATIVE mg/dL   Hgb urine dipstick NEGATIVE NEGATIVE   Bilirubin Urine NEGATIVE NEGATIVE   Ketones, ur NEGATIVE NEGATIVE mg/dL   Protein, ur NEGATIVE NEGATIVE mg/dL   Nitrite NEGATIVE NEGATIVE   Leukocytes,Ua MODERATE (A) NEGATIVE   RBC / HPF 0-5 0 - 5 RBC/hpf   WBC, UA 6-10 0 - 5 WBC/hpf   Bacteria, UA RARE (A) NONE SEEN   Squamous Epithelial / LPF 11-20 0 - 5   Mucus PRESENT   Fetal fibronectin     Status: None   Collection Time: 03/30/21  6:30 PM  Result Value Ref Range   Fetal Fibronectin NEGATIVE NEGATIVE  Wet prep, genital     Status: Abnormal   Collection Time: 03/30/21  6:30 PM  Result Value Ref Range   Yeast Wet Prep HPF POC NONE SEEN NONE SEEN   Trich, Wet Prep NONE SEEN NONE SEEN    Clue Cells Wet Prep HPF POC PRESENT (A) NONE SEEN   WBC, Wet Prep HPF POC MANY (A) NONE SEEN   Sperm NONE SEEN  Meds ordered this encounter  Medications   acetaminophen (TYLENOL) tablet 1,000 mg   lactated ringers bolus 1,000 mL   lactated ringers bolus 1,000 mL   cyclobenzaprine (FLEXERIL) tablet 10 mg   terbutaline (BRETHINE) injection 0.25 mg   metroNIDAZOLE (FLAGYL) 500 MG tablet    Sig: Take 1 tablet (500 mg total) by mouth 2 (two) times daily.    Dispense:  14 tablet    Refill:  0   Assessment and Plan  --25 y.o. [id number] at [redacted]w[redacted]d  --Braxton Hicks contractions --Negative FFN --Closed cervix --Reactive tracing --Bacterial Vaginosis --Discharge home in stable condition  Calvert Cantor, CNM 03/30/2021, 10:02 PM

## 2021-03-30 NOTE — MAU Note (Signed)
Up to bathroom to urinate.

## 2021-03-30 NOTE — MAU Note (Signed)
A lot of cramping over the last 2 days, was waking her up hourly last night. Feeling a lot of pressure, was told the head was low. Has been nauseated and dizzy.

## 2021-03-31 LAB — GC/CHLAMYDIA PROBE AMP (~~LOC~~) NOT AT ARMC
Chlamydia: NEGATIVE
Comment: NEGATIVE
Comment: NORMAL
Neisseria Gonorrhea: NEGATIVE

## 2021-04-01 LAB — CULTURE, OB URINE

## 2021-04-09 ENCOUNTER — Other Ambulatory Visit (HOSPITAL_COMMUNITY)
Admission: RE | Admit: 2021-04-09 | Discharge: 2021-04-09 | Disposition: A | Discharge status: Home / Self Care | Payer: Medicaid Other | Source: Ambulatory Visit | Attending: Obstetrics and Gynecology | Admitting: Obstetrics and Gynecology

## 2021-04-09 ENCOUNTER — Other Ambulatory Visit: Payer: Self-pay

## 2021-04-09 ENCOUNTER — Encounter: Payer: Self-pay | Admitting: Obstetrics and Gynecology

## 2021-04-09 ENCOUNTER — Ambulatory Visit (INDEPENDENT_AMBULATORY_CARE_PROVIDER_SITE_OTHER): Payer: Medicaid Other | Admitting: Obstetrics and Gynecology

## 2021-04-09 VITALS — BP 109/73 | HR 75 | Wt 165.0 lb

## 2021-04-09 DIAGNOSIS — O09893 Supervision of other high risk pregnancies, third trimester: Secondary | ICD-10-CM

## 2021-04-09 DIAGNOSIS — O26893 Other specified pregnancy related conditions, third trimester: Secondary | ICD-10-CM | POA: Diagnosis not present

## 2021-04-09 DIAGNOSIS — N898 Other specified noninflammatory disorders of vagina: Secondary | ICD-10-CM

## 2021-04-09 DIAGNOSIS — Z3A35 35 weeks gestation of pregnancy: Secondary | ICD-10-CM | POA: Insufficient documentation

## 2021-04-09 DIAGNOSIS — Z3480 Encounter for supervision of other normal pregnancy, unspecified trimester: Secondary | ICD-10-CM

## 2021-04-09 DIAGNOSIS — N750 Cyst of Bartholin's gland: Secondary | ICD-10-CM | POA: Insufficient documentation

## 2021-04-09 DIAGNOSIS — F1910 Other psychoactive substance abuse, uncomplicated: Secondary | ICD-10-CM

## 2021-04-09 NOTE — Progress Notes (Signed)
ROB [redacted]w[redacted]d  MAU on 03/30/21.  Pt just completed flagyl.  Pt notes possible UTI and still c/o vaginal irritation .  Had Urine cx done on 03/30/21. Pt advised to leave another sample for cx.

## 2021-04-09 NOTE — Progress Notes (Signed)
   PRENATAL VISIT NOTE  Subjective:  Tracy Guzman is a 25 y.o. [id number] at [redacted]w[redacted]d being seen today for ongoing prenatal care.  She is currently monitored for the following issues for this high-risk pregnancy and has ASTHMA; GERD; BACK PAIN; Suicidal ideation; Polysubstance abuse (HCC); Oppositional defiant disorder; PTSD (post-traumatic stress disorder); MDD (major depressive disorder), recurrent episode, moderate (HCC); Supervision of other normal pregnancy, antepartum; History of preterm delivery, currently pregnant in third trimester; H/O fetal anomaly in prior pregnancy, currently pregnant, second trimester; Vaginal discharge during pregnancy in third trimester; Back pain affecting pregnancy in second trimester; Headache in pregnancy, antepartum, second trimester; [redacted] weeks gestation of pregnancy; and Bartholin's gland cyst on their problem list.  Patient doing well with no acute concerns today. She reports  right bartholins gland cyst x 3 weeks (nontender) and generalized discomfort .  Contractions: Irritability. Vag. Bleeding: None.  Movement: Present. Denies leaking of fluid.   The following portions of the patient's history were reviewed and updated as appropriate: allergies, current medications, past family history, past medical history, past social history, past surgical history and problem list. Problem list updated.  Objective:   Vitals:   04/09/21 1341  BP: 109/73  Pulse: 75  Weight: 165 lb (74.8 kg)    Fetal Status: Fetal Heart Rate (bpm): 152 Fundal Height: 36 cm Movement: Present     General:  Alert, oriented and cooperative. Patient is in no acute distress.  Skin: Skin is warm and dry. No rash noted.   Cardiovascular: Normal heart rate noted  Respiratory: Normal respiratory effort, no problems with respiration noted  Abdomen: Soft, gravid, appropriate for gestational age.  Pain/Pressure: Present     Pelvic: Cervical exam performed Dilation: 1 Effacement (%): 40 Station:  -3  Extremities: Normal range of motion.  Edema: None  Mental Status:  Normal mood and affect. Normal behavior. Normal judgment and thought content.   Assessment and Plan:  Pregnancy: G3P1102 at [redacted]w[redacted]d  1. Supervision of other normal pregnancy, antepartum Continue routine care , GC/C done recently Discussed elective IOL, since cervix not favorable, consider IOL between 39.4-40 weeks - Strep Gp B NAA  2. [redacted] weeks gestation of pregnancy   3. Polysubstance abuse (HCC)   4. History of preterm delivery, currently pregnant in third trimester No s/sx of PTL  5. Bartholin's gland cyst Noted on pt's right, nontender, nonfluctuant, continue warm sitz baths prn, monitor for now  6. Vaginal discharge during pregnancy in third trimester Recheck vaginal swab - Cervicovaginal ancillary only  Preterm labor symptoms and general obstetric precautions including but not limited to vaginal bleeding, contractions, leaking of fluid and fetal movement were reviewed in detail with the patient.  Please refer to After Visit Summary for other counseling recommendations.   Return in about 1 week (around 04/16/2021) for ROB, in person.   Mariel Aloe, MD Faculty Attending Center for [facility]

## 2021-04-10 LAB — CERVICOVAGINAL ANCILLARY ONLY
Bacterial Vaginitis (gardnerella): NEGATIVE
Candida Glabrata: NEGATIVE
Candida Vaginitis: NEGATIVE
Comment: NEGATIVE
Comment: NEGATIVE
Comment: NEGATIVE
Comment: NEGATIVE
Trichomonas: NEGATIVE

## 2021-04-11 LAB — STREP GP B NAA: Strep Gp B NAA: POSITIVE — AB

## 2021-04-13 ENCOUNTER — Inpatient Hospital Stay (HOSPITAL_COMMUNITY)
Admission: AD | Admit: 2021-04-13 | Discharge: 2021-04-13 | Disposition: A | Discharge status: Home / Self Care | Payer: Medicaid Other | Attending: Obstetrics and Gynecology | Admitting: Obstetrics and Gynecology

## 2021-04-13 ENCOUNTER — Other Ambulatory Visit: Payer: Self-pay

## 2021-04-13 ENCOUNTER — Encounter (HOSPITAL_COMMUNITY): Payer: Self-pay | Admitting: Obstetrics and Gynecology

## 2021-04-13 DIAGNOSIS — R1030 Lower abdominal pain, unspecified: Secondary | ICD-10-CM | POA: Diagnosis not present

## 2021-04-13 DIAGNOSIS — Z3480 Encounter for supervision of other normal pregnancy, unspecified trimester: Secondary | ICD-10-CM

## 2021-04-13 DIAGNOSIS — Z638 Other specified problems related to primary support group: Secondary | ICD-10-CM | POA: Diagnosis not present

## 2021-04-13 DIAGNOSIS — Z3A36 36 weeks gestation of pregnancy: Secondary | ICD-10-CM | POA: Diagnosis not present

## 2021-04-13 DIAGNOSIS — R519 Headache, unspecified: Secondary | ICD-10-CM | POA: Diagnosis not present

## 2021-04-13 DIAGNOSIS — O99343 Other mental disorders complicating pregnancy, third trimester: Secondary | ICD-10-CM | POA: Diagnosis not present

## 2021-04-13 DIAGNOSIS — Z87891 Personal history of nicotine dependence: Secondary | ICD-10-CM | POA: Diagnosis not present

## 2021-04-13 DIAGNOSIS — O26893 Other specified pregnancy related conditions, third trimester: Secondary | ICD-10-CM | POA: Diagnosis not present

## 2021-04-13 DIAGNOSIS — G43909 Migraine, unspecified, not intractable, without status migrainosus: Secondary | ICD-10-CM | POA: Diagnosis present

## 2021-04-13 DIAGNOSIS — F439 Reaction to severe stress, unspecified: Secondary | ICD-10-CM

## 2021-04-13 DIAGNOSIS — O26892 Other specified pregnancy related conditions, second trimester: Secondary | ICD-10-CM

## 2021-04-13 LAB — CBC
HCT: 33 % — ABNORMAL LOW (ref 36.0–46.0)
Hemoglobin: 11.1 g/dL — ABNORMAL LOW (ref 12.0–15.0)
MCH: 26.7 pg (ref 26.0–34.0)
MCHC: 33.6 g/dL (ref 30.0–36.0)
MCV: 79.3 fL — ABNORMAL LOW (ref 80.0–100.0)
Platelets: 181 10*3/uL (ref 150–400)
RBC: 4.16 MIL/uL (ref 3.87–5.11)
RDW: 13 % (ref 11.5–15.5)
WBC: 8.1 10*3/uL (ref 4.0–10.5)
nRBC: 0 % (ref 0.0–0.2)

## 2021-04-13 LAB — URINALYSIS, ROUTINE W REFLEX MICROSCOPIC
Bilirubin Urine: NEGATIVE
Glucose, UA: NEGATIVE mg/dL
Hgb urine dipstick: NEGATIVE
Ketones, ur: NEGATIVE mg/dL
Leukocytes,Ua: NEGATIVE
Nitrite: NEGATIVE
Protein, ur: NEGATIVE mg/dL
Specific Gravity, Urine: 1.013 (ref 1.005–1.030)
pH: 7 (ref 5.0–8.0)

## 2021-04-13 LAB — COMPREHENSIVE METABOLIC PANEL
ALT: 20 U/L (ref 0–44)
AST: 25 U/L (ref 15–41)
Albumin: 2.8 g/dL — ABNORMAL LOW (ref 3.5–5.0)
Alkaline Phosphatase: 137 U/L — ABNORMAL HIGH (ref 38–126)
Anion gap: 9 (ref 5–15)
BUN: 5 mg/dL — ABNORMAL LOW (ref 6–20)
CO2: 21 mmol/L — ABNORMAL LOW (ref 22–32)
Calcium: 9 mg/dL (ref 8.9–10.3)
Chloride: 105 mmol/L (ref 98–111)
Creatinine, Ser: 0.52 mg/dL (ref 0.44–1.00)
GFR, Estimated: >60 mL/min (ref >60)
Glucose, Bld: 102 mg/dL — ABNORMAL HIGH (ref 70–99)
Potassium: 3.7 mmol/L (ref 3.5–5.1)
Sodium: 135 mmol/L (ref 135–145)
Total Bilirubin: 1 mg/dL (ref 0.3–1.2)
Total Protein: 6.1 g/dL — ABNORMAL LOW (ref 6.5–8.1)

## 2021-04-13 LAB — PROTEIN / CREATININE RATIO, URINE
Creatinine, Urine: 137.49 mg/dL
Protein Creatinine Ratio: 0.11 mg/mg{Cre} (ref 0.00–0.15)
Total Protein, Urine: 15 mg/dL

## 2021-04-13 MED ORDER — BUTALBITAL-APAP-CAFFEINE 50-325-40 MG PO TABS
1.0000 | ORAL_TABLET | Freq: Once | ORAL | Status: AC
  Administered 2021-04-13: 1 via ORAL
  Filled 2021-04-13: qty 1

## 2021-04-13 MED ORDER — ACETAMINOPHEN 500 MG PO TABS
1000.0000 mg | ORAL_TABLET | Freq: Once | ORAL | Status: AC
  Administered 2021-04-13: 1000 mg via ORAL
  Filled 2021-04-13: qty 2

## 2021-04-13 NOTE — MAU Note (Signed)
....  Tracy Guzman is a 25 y.o. at [redacted]w[redacted]d here in MAU reporting: migraine since yesterday morning around 1130. She endorses feelings of dizziness with activity as well as just lying still. She endorses floaters in her vision as well. She states she has not taken any medications because "I just don't believe in the safety of them really." She states she called the on call nurse and they recommended she take Tylenol, take her BP, and come here. DFM since this morning. She states her baby is usually hyperactive and he has not been that way today. No VB or LOF.   No medications taken today or yesterday. She states she has Flexeril at home and used to take Fioricet for her migraines in her 2nd trimester but is unsure if she has any more at home.  BP: 110/70 P: 116 T: 97.8 R: 17 O2: 99  Pain score: 8/10 migraine FHT: 155 external Lab orders placed from triage:  UA

## 2021-04-13 NOTE — MAU Note (Signed)
Called chaplain and left voicemail.

## 2021-04-13 NOTE — MAU Provider Note (Signed)
Chief Complaint:  Migraine   HPI: Tracy Guzman is a 25 y.o. [id number] at 208w2d who presents to maternity admissions reporting an elevated BP at home and headache. Patient reports that she has had an ongoing migraine headache since yesterday, as well as dizziness and seeing some spots in her vision. Rates headache 7/10. Says that she has had headaches during this pregnancy, which she took Tylenol, Flexeril and Fioricet, however has not had a headache since her second trimester. She reports that she called the on call nurse who told her to take Tylenol, check her BP, and come to MAU. Patient has not taken anything for her headache since it's onset yesterday because "I don't like to take medications unless it's severe". She did take her BP before coming in and reports that it was 132/80. She reports some braxton hicks contractions which are not anything new. She denies vaginal bleeding or leaking fluid. Reports good fetal movement.   Dietary recall: waffles this morning, chicken nuggets and fries around 3:30pm. 5-6 16oz water bottles.  Pregnancy Course:   Past Medical History:  Diagnosis Date   Allergy    Smoke, dust, Pollen   Asthma    Asthma    Depression    Endometriosis    GERD (gastroesophageal reflux disease)    OB History  Gravida Para Term Preterm AB Living  3 2 1 1   2   SAB IAB Ectopic Multiple Live Births          2    # Outcome Date GA Lbr Len/2nd Weight Sex Delivery Anes PTL Lv  3 Current           2 Preterm 04/23/16 4723w0d   M Vag-Spont   LIV  1 Term 06/01/15 6157w0d   F Vag-Spont   LIV   Past Surgical History:  Procedure Laterality Date   MYRINGOTOMY     TONSILLECTOMY AND ADENOIDECTOMY  as infant   WRIST SURGERY  age 25   Family History  Problem Relation Age of Onset   Hypertension Mother    Hypertension Father    Depression Mother    Hyperlipidemia Mother    Depression Maternal Grandmother    Drug abuse Cousin    Drug abuse Maternal Uncle    Hyperlipidemia  Father    Social History   Tobacco Use   Smoking status: Former    Packs/day: 1.00    Years: 1.00    Pack years: 1.00    Types: Cigarettes   Smokeless tobacco: Never  Vaping Use   Vaping Use: Never used  Substance Use Topics   Alcohol use: Not Currently    Comment: not since confirmed pregnancy   Drug use: Not Currently    Types: Other-see comments, MDMA (Ecstacy), Marijuana    Comment: last used end of December   Allergies  Allergen Reactions   Lamotrigine Rash   Medications Prior to Admission  Medication Sig Dispense Refill Last Dose   albuterol (VENTOLIN HFA) 108 (90 Base) MCG/ACT inhaler Inhale 1-2 puffs into the lungs every 6 (six) hours as needed for wheezing or shortness of breath. 18 g 0    Blood Pressure Monitor DEVI Please check blood pressure 1-2 per week 1 each 0    Butalbital-APAP-Caffeine 50-325-40 MG capsule Take 1-2 capsules by mouth every 6 (six) hours as needed for headache. 30 capsule 0    cyclobenzaprine (FLEXERIL) 10 MG tablet Take 1 tablet (10 mg total) by mouth 3 (three) times daily as needed for muscle  spasms. 30 tablet 1    metroNIDAZOLE (FLAGYL) 500 MG tablet Take 1 tablet (500 mg total) by mouth 2 (two) times daily. 14 tablet 0    prenatal vitamin w/FE, FA (PRENATAL 1 + 1) 27-1 MG TABS tablet Take 1 tablet by mouth daily at 12 noon.      I have reviewed patient's Past Medical Hx, Surgical Hx, Family Hx, Social Hx, medications and allergies.   ROS:  Review of Systems  Constitutional: Negative.   Eyes:  Positive for visual disturbance ("spots").  Respiratory: Negative.    Cardiovascular: Negative.   Gastrointestinal:  Positive for abdominal pain (braxton hicks).  Musculoskeletal: Negative.   Neurological:  Positive for dizziness and headaches.   Physical Exam  Patient Vitals for the past 24 hrs:  BP Temp Temp src Pulse Resp SpO2  04/13/21 2034 (!) 104/57 97.9 F (36.6 C) Oral 79 16 --  04/13/21 1846 104/61 -- -- 90 -- --  04/13/21 1830 98/63  -- -- 81 -- --  04/13/21 1815 (!) 89/54 -- -- 96 -- --  04/13/21 1805 104/62 -- -- (!) 106 -- 100 %  04/13/21 1746 104/63 -- -- 91 -- 98 %  04/13/21 1730 110/71 97.8 F (36.6 C) Oral (!) 116 17 99 %   Constitutional: well-developed, well-nourished female in no acute distress.  Cardiovascular: normal rate Respiratory: normal effort GI: abd soft, non-tender, gravid  MS: extremities nontender, no edema, normal ROM Neurologic: alert and oriented x 4  GU: neg CVAT.     FHT:  Baseline 145 bpm, moderate variability, 15x15 accelerations present, no decelerations Toco: q 4 mins   Labs: Results for orders placed or performed during the hospital encounter of 04/13/21 (from the past 24 hour(s))  Protein / creatinine ratio, urine     Status: None   Collection Time: 04/13/21  5:33 PM  Result Value Ref Range   Creatinine, Urine 137.49 mg/dL   Total Protein, Urine 15 mg/dL   Protein Creatinine Ratio 0.11 0.00 - 0.15 mg/mg[Cre]  Urinalysis, Routine w reflex microscopic Urine, Clean Catch     Status: Abnormal   Collection Time: 04/13/21  5:33 PM  Result Value Ref Range   Color, Urine YELLOW YELLOW   APPearance HAZY (A) CLEAR   Specific Gravity, Urine 1.013 1.005 - 1.030   pH 7.0 5.0 - 8.0   Glucose, UA NEGATIVE NEGATIVE mg/dL   Hgb urine dipstick NEGATIVE NEGATIVE   Bilirubin Urine NEGATIVE NEGATIVE   Ketones, ur NEGATIVE NEGATIVE mg/dL   Protein, ur NEGATIVE NEGATIVE mg/dL   Nitrite NEGATIVE NEGATIVE   Leukocytes,Ua NEGATIVE NEGATIVE  CBC     Status: Abnormal   Collection Time: 04/13/21  6:12 PM  Result Value Ref Range   WBC 8.1 4.0 - 10.5 K/uL   RBC 4.16 3.87 - 5.11 MIL/uL   Hemoglobin 11.1 (L) 12.0 - 15.0 g/dL   HCT 24.4 (L) 01.0 - 27.2 %   MCV 79.3 (L) 80.0 - 100.0 fL   MCH 26.7 26.0 - 34.0 pg   MCHC 33.6 30.0 - 36.0 g/dL   RDW 53.6 64.4 - 03.4 %   Platelets 181 150 - 400 K/uL   nRBC 0.0 0.0 - 0.2 %  Comprehensive metabolic panel     Status: Abnormal   Collection Time:  04/13/21  6:12 PM  Result Value Ref Range   Sodium 135 135 - 145 mmol/L   Potassium 3.7 3.5 - 5.1 mmol/L   Chloride 105 98 - 111 mmol/L  CO2 21 (L) 22 - 32 mmol/L   Glucose, Bld 102 (H) 70 - 99 mg/dL   BUN 5 (L) 6 - 20 mg/dL   Creatinine, Ser 1.61 0.44 - 1.00 mg/dL   Calcium 9.0 8.9 - 09.6 mg/dL   Total Protein 6.1 (L) 6.5 - 8.1 g/dL   Albumin 2.8 (L) 3.5 - 5.0 g/dL   AST 25 15 - 41 U/L   ALT 20 0 - 44 U/L   Alkaline Phosphatase 137 (H) 38 - 126 U/L   Total Bilirubin 1.0 0.3 - 1.2 mg/dL   GFR, Estimated >04 >54 mL/min   Anion gap 9 5 - 15    Imaging:  No results found.  MAU Course: Orders Placed This Encounter  Procedures   CBC   Comprehensive metabolic panel   Protein / creatinine ratio, urine   Urinalysis, Routine w reflex microscopic Urine, Clean Catch   Measure blood pressure   Consult to spiritual care   Discharge patient   Meds ordered this encounter  Medications   acetaminophen (TYLENOL) tablet 1,000 mg   butalbital-acetaminophen-caffeine (FIORICET) 50-325-40 MG per tablet 1 tablet    Patient states she took Fioricet at home in her 2nd trimester.   MDM: CBC, CMP, UPCR wnl Serial BP's all normotensive PO hydration Tylenol 1000mg  PO NST reactive Fioricet PO  On reassessment, patient reports headache improved slightly, now 5/10, however patient very tearful. Fioricet PO ordered. When asked what was going on, patient reports a lot of stress at home and feeling overwhelmed. Reports she has limited support and feels that these feelings of depression are contributing to her symptoms. She denies SI/HI. She is requesting to speak with chaplain or SW if available. Chaplain called by RN staff, however discussed that sometimes chaplain and SW resources are limited on weekends and nights. Patient is agreeable to discharge home if chaplain or SW are unable to speak to her tonight.  Reassessment at 2015, headache improved to 3/10. Chaplain services contacted, but no answer.  Voicemail was left. Patient is still agreeable to discharge home with follow up in the office. I feel patient is stable for discharge home given no SI/HI and will. Will send message to [facility] to get patient scheduled with , LCSW asap.   Assessment: 1. Supervision of other normal pregnancy, antepartum   2. [redacted] weeks gestation of pregnancy   3. Headache in pregnancy, antepartum, third trimester   4. Stress at home   5. Headache in pregnancy, antepartum, second trimester     Plan: Discharge home in stable condition  Labor precautions and fetal kick counts Keep appointment as scheduled on 04/16/2021 Strict return precautions reviewed Return to MAU as needed    Follow-up Information     [facility] Follow up.   Specialty: Obstetrics and Gynecology Why: as scheduled. Return to MAU as needed. Contact information: [address], [address] [place] [phone]                Allergies as of 04/13/2021       Reactions   Lamotrigine Rash        Medication List     STOP taking these medications    metroNIDAZOLE 500 MG tablet Commonly known as: Flagyl       TAKE these medications    albuterol 108 (90 Base) MCG/ACT inhaler Commonly known as: VENTOLIN HFA Inhale 1-2 puffs into the lungs every 6 (six) hours as needed for wheezing or  shortness of breath.   Blood Pressure Monitor Devi Please check blood pressure 1-2 per week   Butalbital-APAP-Caffeine 50-325-40 MG capsule Take 1-2 capsules by mouth every 6 (six) hours as needed for headache.   cyclobenzaprine 10 MG tablet Commonly known as: FLEXERIL Take 1 tablet (10 mg total) by mouth 3 (three) times daily as needed for muscle spasms.   prenatal vitamin w/FE, FA 27-1 MG Tabs tablet Take 1 tablet by mouth daily at 12 noon.         Camelia Eng, MSN, CNM 04/13/2021 8:24 PM

## 2021-04-14 ENCOUNTER — Other Ambulatory Visit: Payer: Self-pay

## 2021-04-14 DIAGNOSIS — Z8659 Personal history of other mental and behavioral disorders: Secondary | ICD-10-CM

## 2021-04-15 ENCOUNTER — Ambulatory Visit (INDEPENDENT_AMBULATORY_CARE_PROVIDER_SITE_OTHER): Payer: Medicaid Other | Admitting: Licensed Clinical Social Worker

## 2021-04-15 DIAGNOSIS — Z8659 Personal history of other mental and behavioral disorders: Secondary | ICD-10-CM

## 2021-04-15 NOTE — BH Specialist Note (Signed)
Integrated Behavioral Health via Telemedicine Visit  04/15/2021 Tracy Guzman [id number]  Number of Integrated Behavioral Health visits: 1 Session Start time: 11:00am  Session End time: 11:27am Total time: 27 mins via mychart video   Referring Provider: Lysle Dingwall CNM Patient/Family location: Home  [facility] Provider location: [facility]  All persons participating in visit: [place] and LCSWA A. Felton Clinton Types of Service: General Behavioral Integrated Care (BHI)  I connected with [name] and/or Gabryel Espey's n/a via  Telephone or Video Enabled Telemedicine Application  (Video is Caregility application) and verified that I am speaking with the correct person using two identifiers. Discussed confidentiality: Yes   I discussed the limitations of telemedicine and the availability of in person appointments.  Discussed there is a possibility of technology failure and discussed alternative modes of communication if that failure occurs.  I discussed that engaging in this telemedicine visit, they consent to the provision of behavioral healthcare and the services will be billed under their insurance.  Patient and/or legal guardian expressed understanding and consented to Telemedicine visit: Yes   Presenting Concerns: Patient and/or family reports the following symptoms/concerns: history of depression  Duration of problem: over one year ; Severity of problem: mild  Patient and/or Family's Strengths/Protective Factors: Parental Resilience  Goals Addressed: Patient will:  Reduce symptoms of: depression   Increase knowledge and/or ability of: coping skills and healthy habits   Demonstrate ability to: Increase adequate support systems for patient/family  Progress towards Goals: Ongoing  Interventions: Interventions utilized:  Supportive Counseling Standardized Assessments completed: Not Needed   Assessment: Tracy Guzman reports limited family support and  starting to feel overwhelmed.   Patient may benefit from integrated behavioral health.  Plan: Follow up with behavioral health clinician on : 2 weeks via mychart  Behavioral recommendations: Keep scheduled appt, prioritize self care and rest to prevent burnout, collaborate with community agencies to address areas of need.  Referral(s): Integrated [employer] (In Clinic)  I discussed the assessment and treatment plan with the patient and/or parent/guardian. They were provided an opportunity to ask questions and all were answered. They agreed with the plan and demonstrated an understanding of the instructions.   They were advised to call back or seek an in-person evaluation if the symptoms worsen or if the condition fails to improve as anticipated.  Gwyndolyn Saxon, LCSW

## 2021-04-16 ENCOUNTER — Ambulatory Visit (INDEPENDENT_AMBULATORY_CARE_PROVIDER_SITE_OTHER): Payer: Medicaid Other | Admitting: Obstetrics

## 2021-04-16 ENCOUNTER — Encounter: Payer: Self-pay | Admitting: Obstetrics

## 2021-04-16 ENCOUNTER — Other Ambulatory Visit: Payer: Self-pay

## 2021-04-16 DIAGNOSIS — Z3480 Encounter for supervision of other normal pregnancy, unspecified trimester: Secondary | ICD-10-CM

## 2021-04-16 NOTE — Progress Notes (Signed)
Pt presents for ROB without complaints today.  

## 2021-04-16 NOTE — Progress Notes (Signed)
Subjective:  Tracy Guzman is a 24 y.o. Y7X4128 at [redacted]w[redacted]d being seen today for ongoing prenatal care.  She is currently monitored for the following issues for this low-risk pregnancy and has ASTHMA; GERD; BACK PAIN; Suicidal ideation; Polysubstance abuse (HCC); Oppositional defiant disorder; PTSD (post-traumatic stress disorder); MDD (major depressive disorder), recurrent episode, moderate (HCC); Supervision of other normal pregnancy, antepartum; History of preterm delivery, currently pregnant in third trimester; H/O fetal anomaly in prior pregnancy, currently pregnant, second trimester; Vaginal discharge during pregnancy in third trimester; Back pain affecting pregnancy in second trimester; Headache in pregnancy, antepartum, second trimester; [redacted] weeks gestation of pregnancy; and Bartholin's gland cyst on their problem list.  Patient reports no complaints.  Contractions: Irritability. Vag. Bleeding: None.  Movement: Present. Denies leaking of fluid.   The following portions of the patient's history were reviewed and updated as appropriate: allergies, current medications, past family history, past medical history, past social history, past surgical history and problem list. Problem list updated.  Objective:   Vitals:   04/16/21 1406  BP: 131/82  Pulse: (!) 101  Weight: 161 lb 9.6 oz (73.3 kg)    Fetal Status:     Movement: Present     General:  Alert, oriented and cooperative. Patient is in no acute distress.  Skin: Skin is warm and dry. No rash noted.   Cardiovascular: Normal heart rate noted  Respiratory: Normal respiratory effort, no problems with respiration noted  Abdomen: Soft, gravid, appropriate for gestational age. Pain/Pressure: Present     Pelvic:  Cervical exam deferred        Extremities: Normal range of motion.  Edema: None  Mental Status: Normal mood and affect. Normal behavior. Normal judgment and thought content.   Urinalysis:      Assessment and Plan:  Pregnancy:  G3P1102 at [redacted]w[redacted]d  1. Supervision of other normal pregnancy, antepartum   Preterm labor symptoms and general obstetric precautions including but not limited to vaginal bleeding, contractions, leaking of fluid and fetal movement were reviewed in detail with the patient. Please refer to After Visit Summary for other counseling recommendations.   Return in about 1 week (around 04/23/2021) for ROB.   Brock Bad, MD  04/16/21

## 2021-04-23 ENCOUNTER — Other Ambulatory Visit: Payer: Self-pay | Admitting: Advanced Practice Midwife

## 2021-04-23 ENCOUNTER — Encounter: Payer: Self-pay | Admitting: Obstetrics

## 2021-04-23 ENCOUNTER — Ambulatory Visit (INDEPENDENT_AMBULATORY_CARE_PROVIDER_SITE_OTHER): Payer: Medicaid Other | Admitting: Obstetrics

## 2021-04-23 ENCOUNTER — Other Ambulatory Visit: Payer: Self-pay

## 2021-04-23 DIAGNOSIS — Z3480 Encounter for supervision of other normal pregnancy, unspecified trimester: Secondary | ICD-10-CM

## 2021-04-23 NOTE — Addendum Note (Signed)
Addended by: Coral Ceo A on: 04/23/2021 11:24 AM   Modules accepted: Orders, SmartSet

## 2021-04-23 NOTE — Progress Notes (Signed)
Subjective:  Tracy Guzman is a 25 y.o. [id number] at [redacted]w[redacted]d being seen today for ongoing prenatal care.  She is currently monitored for the following issues for this low-risk pregnancy and has ASTHMA; GERD; BACK PAIN; Suicidal ideation; Polysubstance abuse (HCC); Oppositional defiant disorder; PTSD (post-traumatic stress disorder); MDD (major depressive disorder), recurrent episode, moderate (HCC); Supervision of other normal pregnancy, antepartum; History of preterm delivery, currently pregnant in third trimester; H/O fetal anomaly in prior pregnancy, currently pregnant, second trimester; Vaginal discharge during pregnancy in third trimester; Back pain affecting pregnancy in second trimester; Headache in pregnancy, antepartum, second trimester; [redacted] weeks gestation of pregnancy; and Bartholin's gland cyst on their problem list.  Patient reports occasional contractions.  Contractions: Irregular. Vag. Bleeding: None.  Movement: Present. Denies leaking of fluid.   The following portions of the patient's history were reviewed and updated as appropriate: allergies, current medications, past family history, past medical history, past social history, past surgical history and problem list. Problem list updated.  Objective:   Vitals:   04/23/21 1031  BP: 106/67  Pulse: 99  Weight: 162 lb 12.8 oz (73.8 kg)    Fetal Status:     Movement: Present     General:  Alert, oriented and cooperative. Patient is in no acute distress.  Skin: Skin is warm and dry. No rash noted.   Cardiovascular: Normal heart rate noted  Respiratory: Normal respiratory effort, no problems with respiration noted  Abdomen: Soft, gravid, appropriate for gestational age. Pain/Pressure: Present     Pelvic:  Cervical exam deferred        Extremities: Normal range of motion.  Edema: None  Mental Status: Normal mood and affect. Normal behavior. Normal judgment and thought content.   Urinalysis:      Assessment and Plan:   Pregnancy: G3P1102 at [redacted]w[redacted]d  1. Supervision of other normal pregnancy, antepartum   Term labor symptoms and general obstetric precautions including but not limited to vaginal bleeding, contractions, leaking of fluid and fetal movement were reviewed in detail with the patient. Please refer to After Visit Summary for other counseling recommendations.   Return in about 1 week (around 04/30/2021) for ROB.   Brock Bad, MD  04/23/21

## 2021-04-23 NOTE — Progress Notes (Signed)
Pt reports fetal movement with irregular contractions all morning. Pt denies bleeding.

## 2021-04-24 DIAGNOSIS — O26893 Other specified pregnancy related conditions, third trimester: Secondary | ICD-10-CM

## 2021-04-24 DIAGNOSIS — N898 Other specified noninflammatory disorders of vagina: Secondary | ICD-10-CM

## 2021-04-24 MED ORDER — METRONIDAZOLE 0.75 % VA GEL: 1.0000 | Freq: Every day | VAGINAL | 0 refills | Status: AC

## 2021-04-25 ENCOUNTER — Encounter (HOSPITAL_COMMUNITY): Payer: Self-pay

## 2021-04-25 ENCOUNTER — Encounter (HOSPITAL_COMMUNITY): Payer: Self-pay | Admitting: *Deleted

## 2021-04-25 ENCOUNTER — Telehealth (HOSPITAL_COMMUNITY): Payer: Self-pay | Admitting: *Deleted

## 2021-04-25 NOTE — Telephone Encounter (Signed)
Preadmission screen  

## 2021-04-26 ENCOUNTER — Encounter (HOSPITAL_COMMUNITY): Payer: Self-pay | Admitting: Obstetrics and Gynecology

## 2021-04-26 ENCOUNTER — Inpatient Hospital Stay (HOSPITAL_COMMUNITY)
Admission: AD | Admit: 2021-04-26 | Discharge: 2021-04-26 | Disposition: A | Discharge status: Home / Self Care | Payer: Medicaid Other | Attending: Obstetrics and Gynecology | Admitting: Obstetrics and Gynecology

## 2021-04-26 DIAGNOSIS — Z3689 Encounter for other specified antenatal screening: Secondary | ICD-10-CM

## 2021-04-26 DIAGNOSIS — O471 False labor at or after 37 completed weeks of gestation: Secondary | ICD-10-CM

## 2021-04-26 DIAGNOSIS — O479 False labor, unspecified: Secondary | ICD-10-CM

## 2021-04-26 DIAGNOSIS — Z3A38 38 weeks gestation of pregnancy: Secondary | ICD-10-CM | POA: Diagnosis not present

## 2021-04-26 NOTE — MAU Provider Note (Signed)
History     CSN: [id number]  Arrival date and time: 04/26/21 1142   Event Date/Time   First Provider Initiated Contact with Patient 04/26/21 1210      Chief Complaint  Patient presents with   Contractions   Decreased Fetal Movement   HPI Tracy Guzman is a 25 y.o. [id number] at [redacted]w[redacted]d who presents with decreased fetal movement. She reports yesterday she only felt 10 movements total and today she only felt 5 in the last 2 hours. She also reports intermittent contractions. She is unsure how often but rates them an 8/10 when they come. She denies any bleeding or leaking.   OB History     Gravida  3   Para  2   Term  1   Preterm  1   AB      Living  2      SAB      IAB      Ectopic      Multiple      Live Births  2           Past Medical History:  Diagnosis Date   Allergy    Smoke, dust, Pollen   Asthma    Asthma    Depression    Endometriosis    GERD (gastroesophageal reflux disease)     Past Surgical History:  Procedure Laterality Date   MYRINGOTOMY     TONSILLECTOMY AND ADENOIDECTOMY  as infant   WRIST SURGERY  age 19    Family History  Problem Relation Age of Onset   Hypertension Mother    Hypertension Father    Depression Mother    Hyperlipidemia Mother    Depression Maternal Grandmother    Drug abuse Cousin    Drug abuse Maternal Uncle    Hyperlipidemia Father     Social History   Tobacco Use   Smoking status: Former    Packs/day: 1.00    Years: 1.00    Pack years: 1.00    Types: Cigarettes   Smokeless tobacco: Never  Vaping Use   Vaping Use: Never used  Substance Use Topics   Alcohol use: Not Currently    Comment: not since confirmed pregnancy   Drug use: Not Currently    Types: Other-see comments, MDMA (Ecstacy), Marijuana    Comment: last used end of December    Allergies:  Allergies  Allergen Reactions   Lamotrigine Rash    Medications Prior to Admission  Medication Sig Dispense Refill Last Dose    albuterol (VENTOLIN HFA) 108 (90 Base) MCG/ACT inhaler Inhale 1-2 puffs into the lungs every 6 (six) hours as needed for wheezing or shortness of breath. 18 g 0    Blood Pressure Monitor DEVI Please check blood pressure 1-2 per week 1 each 0    Butalbital-APAP-Caffeine 50-325-40 MG capsule Take 1-2 capsules by mouth every 6 (six) hours as needed for headache. 30 capsule 0    cyclobenzaprine (FLEXERIL) 10 MG tablet Take 1 tablet (10 mg total) by mouth 3 (three) times daily as needed for muscle spasms. 30 tablet 1    metroNIDAZOLE (METROGEL VAGINAL) 0.75 % vaginal gel Place 1 Applicatorful vaginally at bedtime for 5 days. 50 g 0    prenatal vitamin w/FE, FA (PRENATAL 1 + 1) 27-1 MG TABS tablet Take 1 tablet by mouth daily at 12 noon.       Review of Systems  Constitutional: Negative.  Negative for fatigue and fever.  HENT: Negative.  Respiratory: Negative.  Negative for shortness of breath.   Cardiovascular: Negative.  Negative for chest pain.  Gastrointestinal:  Positive for abdominal pain. Negative for constipation, diarrhea, nausea and vomiting.  Genitourinary: Negative.  Negative for dysuria, vaginal bleeding and vaginal discharge.  Neurological: Negative.  Negative for dizziness and headaches.  Physical Exam   Blood pressure 113/69, pulse (!) 112, temperature 97.9 F (36.6 C), temperature source Oral, resp. rate 16, last menstrual period 08/02/2020, SpO2 97 %.  Physical Exam Vitals and nursing note reviewed.  Constitutional:      General: She is not in acute distress.    Appearance: She is well-developed.  HENT:     Head: Normocephalic.  Eyes:     Pupils: Pupils are equal, round, and reactive to light.  Cardiovascular:     Rate and Rhythm: Normal rate and regular rhythm.     Heart sounds: Normal heart sounds.  Pulmonary:     Effort: Pulmonary effort is normal. No respiratory distress.     Breath sounds: Normal breath sounds.  Abdominal:     General: Bowel sounds are normal.  There is no distension.     Palpations: Abdomen is soft.     Tenderness: There is no abdominal tenderness.  Skin:    General: Skin is warm and dry.  Neurological:     Mental Status: She is alert and oriented to person, place, and time.  Psychiatric:        Mood and Affect: Mood normal.        Behavior: Behavior normal.        Thought Content: Thought content normal.        Judgment: Judgment normal.   Fetal Tracing:  Baseline: 140 Variability: moderate Accels: 15x15  Decels: none  Toco: occasional uc's  Dilation: 1 Cervical Position: Posterior Presentation: Undeterminable Exam by:: Marvel Plan RN   MAU Course  Procedures  MDM Cervix unchanged NST reactive and patient reports normal movement since being in MAU  Assessment and Plan   1. False labor   2. [redacted] weeks gestation of pregnancy   3. NST (non-stress test) reactive    -Discharge home in stable condition -Labor precautions discussed -Patient advised to follow-up with OB as scheduled for prenatal care -Patient may return to MAU as needed or if her condition were to change or worsen   Rolm Bookbinder CNM 04/26/2021, 12:10 PM

## 2021-04-26 NOTE — Discharge Instructions (Signed)

## 2021-04-26 NOTE — MAU Note (Signed)
Pt reports to mau with c/o of lower abd pain that she believes are ctx.  Reports pain has been ongoing for the past 2 days.  Unable to verbalize a pattern.  Pt also reports DFM for the past day.  Reports only feeling baby move 10 times yesterday and reports 4-5 movements in the last hour today.  Denies vag bleeding or LOF

## 2021-04-29 ENCOUNTER — Ambulatory Visit (INDEPENDENT_AMBULATORY_CARE_PROVIDER_SITE_OTHER): Payer: Medicaid Other | Admitting: Licensed Clinical Social Worker

## 2021-04-29 DIAGNOSIS — Z8659 Personal history of other mental and behavioral disorders: Secondary | ICD-10-CM

## 2021-04-29 NOTE — BH Specialist Note (Signed)
Integrated Behavioral Health via Telemedicine Visit  04/29/2021 Tracy Guzman [id number]  Number of Integrated Behavioral Health visits: 2 Session Start time: 1:00pm  Session End time: 1:14pm Total time: 14 mins via phone   Referring Provider: Aron Baba  Patient/Family location: Home  [facility] Provider location: [facility]  All persons participating in visit: [place] and LCSWA A. Felton [place]  Types of Service: General Behavioral Integrated Care (BHI)  I connected with Tracy Guzman and/or Tracy Guzman's n/a via  Telephone or Video Enabled Telemedicine Application  (Video is Caregility application) and verified that I am speaking with the correct person using two identifiers. Discussed confidentiality: Yes   I discussed the limitations of telemedicine and the availability of in person appointments.  Discussed there is a possibility of technology failure and discussed alternative modes of communication if that failure occurs.  I discussed that engaging in this telemedicine visit, they consent to the provision of behavioral healthcare and the services will be billed under their insurance.  Patient and/or legal guardian expressed understanding and consented to Telemedicine visit: Yes   Presenting Concerns: Patient and/or family reports the following symptoms/concerns: depression  Duration of problem: over one year ; Severity of problem: mild  Patient and/or Family's Strengths/Protective Factors: Concrete supports in place (healthy food, safe environments, etc.)  Goals Addressed: Patient will:  Reduce symptoms of: depression   Increase knowledge and/or ability of: coping skills   Demonstrate ability to: Increase adequate support systems for patient/family  Progress towards Goals: Ongoing  Interventions: Interventions utilized:  Solution-Focused Strategies and Supportive Counseling Standardized Assessments completed: PHQ 9  Patient and/or Family Response: Ms.  Tracy Guzman reports improvement in mood. Ms. Tracy Guzman is aware to community resources for additional support   Assessment: Patient currently experiencing depression affecting pregnancy.   Patient may benefit from integrated behavioral health.  Plan: Follow up with behavioral health clinician on : four weeks via mychart  Behavioral recommendations: Keep scheduled appt, prioritize self care and rest to prevent burnout, collaborate with community agencies to address areas of need.  Referral(s): Integrated [employer] (In Clinic)  I discussed the assessment and treatment plan with the patient and/or parent/guardian. They were provided an opportunity to ask questions and all were answered. They agreed with the plan and demonstrated an understanding of the instructions.   They were advised to call back or seek an in-person evaluation if the symptoms worsen or if the condition fails to improve as anticipated.  Gwyndolyn Saxon, LCSW

## 2021-04-30 ENCOUNTER — Encounter: Payer: Self-pay | Admitting: Obstetrics

## 2021-04-30 ENCOUNTER — Other Ambulatory Visit: Payer: Self-pay

## 2021-04-30 ENCOUNTER — Other Ambulatory Visit (HOSPITAL_COMMUNITY)
Admission: RE | Admit: 2021-04-30 | Discharge: 2021-04-30 | Disposition: A | Discharge status: Home / Self Care | Payer: Medicaid Other | Source: Ambulatory Visit | Attending: Obstetrics | Admitting: Obstetrics

## 2021-04-30 ENCOUNTER — Ambulatory Visit (INDEPENDENT_AMBULATORY_CARE_PROVIDER_SITE_OTHER): Payer: Medicaid Other | Admitting: Obstetrics

## 2021-04-30 VITALS — BP 105/69 | HR 89 | Wt 162.7 lb

## 2021-04-30 DIAGNOSIS — N898 Other specified noninflammatory disorders of vagina: Secondary | ICD-10-CM | POA: Insufficient documentation

## 2021-04-30 DIAGNOSIS — Z3480 Encounter for supervision of other normal pregnancy, unspecified trimester: Secondary | ICD-10-CM

## 2021-04-30 DIAGNOSIS — O26893 Other specified pregnancy related conditions, third trimester: Secondary | ICD-10-CM

## 2021-04-30 NOTE — Progress Notes (Signed)
ROB 38.5 wks  Reports signs and symptoms BV or Yeast, will self swab.

## 2021-04-30 NOTE — Progress Notes (Signed)
Subjective:  Tracy Guzman is a 25 y.o. [id number] at [redacted]w[redacted]d being seen today for ongoing prenatal care.  She is currently monitored for the following issues for this low-risk pregnancy and has ASTHMA; GERD; BACK PAIN; Suicidal ideation; Polysubstance abuse (HCC); Oppositional defiant disorder; PTSD (post-traumatic stress disorder); MDD (major depressive disorder), recurrent episode, moderate (HCC); Supervision of other normal pregnancy, antepartum; History of preterm delivery, currently pregnant in third trimester; H/O fetal anomaly in prior pregnancy, currently pregnant, second trimester; Vaginal discharge during pregnancy in third trimester; Back pain affecting pregnancy in second trimester; Headache in pregnancy, antepartum, second trimester; [redacted] weeks gestation of pregnancy; and Bartholin's gland cyst on their problem list.  Patient reports vaginal irritation.  Contractions: Irritability. Vag. Bleeding: None.  Movement: Present. Denies leaking of fluid.   The following portions of the patient's history were reviewed and updated as appropriate: allergies, current medications, past family history, past medical history, past social history, past surgical history and problem list. Problem list updated.  Objective:   Vitals:   04/30/21 1017  BP: 105/69  Pulse: 89  Weight: 162 lb 11.2 oz (73.8 kg)    Fetal Status:     Movement: Present     General:  Alert, oriented and cooperative. Patient is in no acute distress.  Skin: Skin is warm and dry. No rash noted.   Cardiovascular: Normal heart rate noted  Respiratory: Normal respiratory effort, no problems with respiration noted  Abdomen: Soft, gravid, appropriate for gestational age. Pain/Pressure: Present     Pelvic:  Cervical exam deferred        Extremities: Normal range of motion.  Edema: None  Mental Status: Normal mood and affect. Normal behavior. Normal judgment and thought content.   Urinalysis:      Assessment and Plan:  Pregnancy:  G3P1102 at [redacted]w[redacted]d  1. Supervision of other normal pregnancy, antepartum  2. Vaginal discharge during pregnancy in third trimester Rx: - Cervicovaginal ancillary only( [facility])   Term labor symptoms and general obstetric precautions including but not limited to vaginal bleeding, contractions, leaking of fluid and fetal movement were reviewed in detail with the patient. Please refer to After Visit Summary for other counseling recommendations.   Return in about 4 weeks (around 05/28/2021) for Postpartum.   Brock Bad, MD  04/30/21

## 2021-05-01 ENCOUNTER — Other Ambulatory Visit: Payer: Self-pay | Admitting: Obstetrics

## 2021-05-01 LAB — CERVICOVAGINAL ANCILLARY ONLY
Bacterial Vaginitis (gardnerella): NEGATIVE
Candida Glabrata: NEGATIVE
Candida Vaginitis: POSITIVE — AB
Chlamydia: NEGATIVE
Comment: NEGATIVE
Comment: NEGATIVE
Comment: NEGATIVE
Comment: NEGATIVE
Comment: NEGATIVE
Comment: NORMAL
Neisseria Gonorrhea: NEGATIVE
Trichomonas: NEGATIVE

## 2021-05-02 ENCOUNTER — Inpatient Hospital Stay (HOSPITAL_COMMUNITY): Payer: Medicaid Other

## 2021-05-04 ENCOUNTER — Inpatient Hospital Stay (HOSPITAL_COMMUNITY): Payer: Medicaid Other | Admitting: Anesthesiology

## 2021-05-04 ENCOUNTER — Inpatient Hospital Stay (HOSPITAL_COMMUNITY)
Admission: AD | Admit: 2021-05-04 | Discharge: 2021-05-06 | DRG: 807 | Disposition: A | Discharge status: Home / Self Care | Payer: Medicaid Other | Attending: Obstetrics & Gynecology | Admitting: Obstetrics & Gynecology

## 2021-05-04 ENCOUNTER — Encounter (HOSPITAL_COMMUNITY): Payer: Self-pay | Admitting: Obstetrics and Gynecology

## 2021-05-04 ENCOUNTER — Other Ambulatory Visit: Payer: Self-pay

## 2021-05-04 DIAGNOSIS — J45909 Unspecified asthma, uncomplicated: Secondary | ICD-10-CM | POA: Diagnosis present

## 2021-05-04 DIAGNOSIS — Z3480 Encounter for supervision of other normal pregnancy, unspecified trimester: Secondary | ICD-10-CM

## 2021-05-04 DIAGNOSIS — F331 Major depressive disorder, recurrent, moderate: Secondary | ICD-10-CM | POA: Diagnosis present

## 2021-05-04 DIAGNOSIS — Z3490 Encounter for supervision of normal pregnancy, unspecified, unspecified trimester: Secondary | ICD-10-CM | POA: Diagnosis present

## 2021-05-04 DIAGNOSIS — Z3A39 39 weeks gestation of pregnancy: Secondary | ICD-10-CM

## 2021-05-04 DIAGNOSIS — O99820 Streptococcus B carrier state complicating pregnancy: Secondary | ICD-10-CM | POA: Diagnosis not present

## 2021-05-04 DIAGNOSIS — O9952 Diseases of the respiratory system complicating childbirth: Principal | ICD-10-CM | POA: Diagnosis present

## 2021-05-04 DIAGNOSIS — Z87891 Personal history of nicotine dependence: Secondary | ICD-10-CM

## 2021-05-04 DIAGNOSIS — O99824 Streptococcus B carrier state complicating childbirth: Secondary | ICD-10-CM | POA: Diagnosis present

## 2021-05-04 DIAGNOSIS — O26893 Other specified pregnancy related conditions, third trimester: Secondary | ICD-10-CM | POA: Diagnosis present

## 2021-05-04 LAB — CBC
HCT: 34.7 % — ABNORMAL LOW (ref 36.0–46.0)
Hemoglobin: 11.5 g/dL — ABNORMAL LOW (ref 12.0–15.0)
MCH: 25.8 pg — ABNORMAL LOW (ref 26.0–34.0)
MCHC: 33.1 g/dL (ref 30.0–36.0)
MCV: 78 fL — ABNORMAL LOW (ref 80.0–100.0)
Platelets: 174 10*3/uL (ref 150–400)
RBC: 4.45 MIL/uL (ref 3.87–5.11)
RDW: 13.5 % (ref 11.5–15.5)
WBC: 6.2 10*3/uL (ref 4.0–10.5)
nRBC: 0 % (ref 0.0–0.2)

## 2021-05-04 LAB — TYPE AND SCREEN
ABO/RH(D): B POS
Antibody Screen: NEGATIVE

## 2021-05-04 LAB — RPR: RPR Ser Ql: NONREACTIVE

## 2021-05-04 MED ORDER — OXYTOCIN-SODIUM CHLORIDE 30-0.9 UT/500ML-% IV SOLN
1.0000 m[IU]/min | INTRAVENOUS | Status: DC
Start: 2021-05-04 — End: 2021-05-04

## 2021-05-04 MED ORDER — SENNOSIDES-DOCUSATE SODIUM 8.6-50 MG PO TABS
2.0000 | ORAL_TABLET | ORAL | Status: DC
  Administered 2021-05-04 – 2021-05-05 (×2): 2 via ORAL
  Filled 2021-05-04 (×2): qty 2

## 2021-05-04 MED ORDER — PRENATAL MULTIVITAMIN CH
1.0000 | ORAL_TABLET | Freq: Every day | ORAL | Status: DC
  Administered 2021-05-05 – 2021-05-06 (×2): 1 via ORAL
  Filled 2021-05-04 (×2): qty 1

## 2021-05-04 MED ORDER — DIPHENHYDRAMINE HCL 50 MG/ML IJ SOLN: 12.5000 mg | INTRAMUSCULAR | Status: DC | PRN

## 2021-05-04 MED ORDER — ACETAMINOPHEN 325 MG PO TABS: 650.0000 mg | ORAL_TABLET | ORAL | Status: DC | PRN

## 2021-05-04 MED ORDER — ONDANSETRON HCL 4 MG PO TABS: 4.0000 mg | ORAL_TABLET | ORAL | Status: DC | PRN

## 2021-05-04 MED ORDER — LIDOCAINE HCL (PF) 1 % IJ SOLN
INTRAMUSCULAR | Status: DC | PRN
  Administered 2021-05-04: 11 mL via EPIDURAL

## 2021-05-04 MED ORDER — TETANUS-DIPHTH-ACELL PERTUSSIS 5-2.5-18.5 LF-MCG/0.5 IM SUSY: 0.5000 mL | PREFILLED_SYRINGE | Freq: Once | INTRAMUSCULAR | Status: DC

## 2021-05-04 MED ORDER — SOD CITRATE-CITRIC ACID 500-334 MG/5ML PO SOLN: 30.0000 mL | ORAL | Status: DC | PRN

## 2021-05-04 MED ORDER — TERBUTALINE SULFATE 1 MG/ML IJ SOLN: 0.2500 mg | Freq: Once | INTRAMUSCULAR | Status: DC | PRN

## 2021-05-04 MED ORDER — OXYTOCIN-SODIUM CHLORIDE 30-0.9 UT/500ML-% IV SOLN: 2.5000 [IU]/h | INTRAVENOUS | Status: DC

## 2021-05-04 MED ORDER — LACTATED RINGERS IV SOLN: 500.0000 mL | Freq: Once | INTRAVENOUS | Status: DC

## 2021-05-04 MED ORDER — OXYCODONE-ACETAMINOPHEN 5-325 MG PO TABS: 2.0000 | ORAL_TABLET | ORAL | Status: DC | PRN

## 2021-05-04 MED ORDER — METHYLERGONOVINE MALEATE 0.2 MG/ML IJ SOLN: 0.2000 mg | Freq: Once | INTRAMUSCULAR | Status: DC

## 2021-05-04 MED ORDER — IBUPROFEN 600 MG PO TABS
600.0000 mg | ORAL_TABLET | Freq: Four times a day (QID) | ORAL | Status: DC
  Administered 2021-05-05 – 2021-05-06 (×7): 600 mg via ORAL
  Filled 2021-05-04 (×7): qty 1

## 2021-05-04 MED ORDER — FENTANYL CITRATE (PF) 100 MCG/2ML IJ SOLN: 100.0000 ug | INTRAMUSCULAR | Status: DC | PRN

## 2021-05-04 MED ORDER — PENICILLIN G POT IN DEXTROSE 60000 UNIT/ML IV SOLN: 3.0000 10*6.[IU] | INTRAVENOUS | Status: DC

## 2021-05-04 MED ORDER — SIMETHICONE 80 MG PO CHEW: 80.0000 mg | CHEWABLE_TABLET | ORAL | Status: DC | PRN

## 2021-05-04 MED ORDER — ONDANSETRON HCL 4 MG/2ML IJ SOLN: 4.0000 mg | Freq: Four times a day (QID) | INTRAMUSCULAR | Status: DC | PRN

## 2021-05-04 MED ORDER — LIDOCAINE HCL (PF) 1 % IJ SOLN: 30.0000 mL | INTRAMUSCULAR | Status: DC | PRN

## 2021-05-04 MED ORDER — COCONUT OIL OIL: 1.0000 "application " | TOPICAL_OIL | Status: DC | PRN

## 2021-05-04 MED ORDER — OXYCODONE-ACETAMINOPHEN 5-325 MG PO TABS: 1.0000 | ORAL_TABLET | ORAL | Status: DC | PRN

## 2021-05-04 MED ORDER — METHYLERGONOVINE MALEATE 0.2 MG/ML IJ SOLN
INTRAMUSCULAR | Status: AC
  Filled 2021-05-04: qty 1

## 2021-05-04 MED ORDER — DIBUCAINE (PERIANAL) 1 % EX OINT: 1.0000 "application " | TOPICAL_OINTMENT | CUTANEOUS | Status: DC | PRN

## 2021-05-04 MED ORDER — MISOPROSTOL 25 MCG QUARTER TABLET
25.0000 ug | ORAL_TABLET | ORAL | Status: DC | PRN
  Filled 2021-05-04: qty 1

## 2021-05-04 MED ORDER — SODIUM CHLORIDE 0.9 % IV SOLN
5.0000 10*6.[IU] | Freq: Once | INTRAVENOUS | Status: AC
  Administered 2021-05-04: 5 10*6.[IU] via INTRAVENOUS
  Filled 2021-05-04: qty 5

## 2021-05-04 MED ORDER — WITCH HAZEL-GLYCERIN EX PADS: 1.0000 "application " | MEDICATED_PAD | CUTANEOUS | Status: DC | PRN

## 2021-05-04 MED ORDER — SODIUM CHLORIDE 0.9 % IV SOLN: 5.0000 10*6.[IU] | Freq: Once | INTRAVENOUS | Status: DC

## 2021-05-04 MED ORDER — BENZOCAINE-MENTHOL 20-0.5 % EX AERO: 1.0000 "application " | INHALATION_SPRAY | CUTANEOUS | Status: DC | PRN

## 2021-05-04 MED ORDER — PENICILLIN G POT IN DEXTROSE 60000 UNIT/ML IV SOLN
3.0000 10*6.[IU] | INTRAVENOUS | Status: DC
  Administered 2021-05-04: 3 10*6.[IU] via INTRAVENOUS
  Filled 2021-05-04: qty 50

## 2021-05-04 MED ORDER — EPHEDRINE 5 MG/ML INJ: 10.0000 mg | INTRAVENOUS | Status: DC | PRN

## 2021-05-04 MED ORDER — LACTATED RINGERS IV SOLN
500.0000 mL | INTRAVENOUS | Status: DC | PRN
  Administered 2021-05-04: 500 mL via INTRAVENOUS

## 2021-05-04 MED ORDER — MISOPROSTOL 25 MCG QUARTER TABLET
25.0000 ug | ORAL_TABLET | ORAL | Status: DC | PRN
  Administered 2021-05-04: 25 ug via VAGINAL

## 2021-05-04 MED ORDER — ONDANSETRON HCL 4 MG/2ML IJ SOLN: 4.0000 mg | INTRAMUSCULAR | Status: DC | PRN

## 2021-05-04 MED ORDER — PHENYLEPHRINE 40 MCG/ML (10ML) SYRINGE FOR IV PUSH (FOR BLOOD PRESSURE SUPPORT)
80.0000 ug | PREFILLED_SYRINGE | INTRAVENOUS | Status: DC | PRN
  Administered 2021-05-04: 80 ug via INTRAVENOUS

## 2021-05-04 MED ORDER — FENTANYL-BUPIVACAINE-NACL 0.5-0.125-0.9 MG/250ML-% EP SOLN
12.0000 mL/h | EPIDURAL | Status: DC | PRN
Start: 2021-05-04 — End: 2021-05-04
  Administered 2021-05-04: 12 mL/h via EPIDURAL
  Filled 2021-05-04: qty 250

## 2021-05-04 MED ORDER — OXYTOCIN-SODIUM CHLORIDE 30-0.9 UT/500ML-% IV SOLN
1.0000 m[IU]/min | INTRAVENOUS | Status: DC
  Filled 2021-05-04: qty 500

## 2021-05-04 MED ORDER — OXYTOCIN BOLUS FROM INFUSION
333.0000 mL | Freq: Once | INTRAVENOUS | Status: AC
  Administered 2021-05-04: 333 mL via INTRAVENOUS

## 2021-05-04 MED ORDER — PHENYLEPHRINE 40 MCG/ML (10ML) SYRINGE FOR IV PUSH (FOR BLOOD PRESSURE SUPPORT)
80.0000 ug | PREFILLED_SYRINGE | INTRAVENOUS | Status: DC | PRN
  Filled 2021-05-04: qty 10

## 2021-05-04 MED ORDER — LACTATED RINGERS IV SOLN
INTRAVENOUS | Status: DC
  Administered 2021-05-04: 125 mL/h via INTRAVENOUS

## 2021-05-04 MED ORDER — ZOLPIDEM TARTRATE 5 MG PO TABS: 5.0000 mg | ORAL_TABLET | Freq: Every evening | ORAL | Status: DC | PRN

## 2021-05-04 MED ORDER — DIPHENHYDRAMINE HCL 25 MG PO CAPS
25.0000 mg | ORAL_CAPSULE | Freq: Four times a day (QID) | ORAL | Status: DC | PRN
  Administered 2021-05-04: 25 mg via ORAL
  Filled 2021-05-04: qty 1

## 2021-05-04 NOTE — Anesthesia Preprocedure Evaluation (Signed)
Anesthesia Evaluation  Patient identified by MRN, date of birth, ID band Patient awake    Reviewed: Allergy & Precautions, NPO status , Patient's Chart, lab work & pertinent test results  Airway Mallampati: II  TM Distance: >3 FB Neck ROM: Full    Dental no notable dental hx.    Pulmonary asthma , former smoker,    Pulmonary exam normal breath sounds clear to auscultation       Cardiovascular negative cardio ROS Normal cardiovascular exam Rhythm:Regular Rate:Normal     Neuro/Psych Anxiety Depression negative neurological ROS  negative psych ROS   GI/Hepatic Neg liver ROS, GERD  ,  Endo/Other  negative endocrine ROS  Renal/GU negative Renal ROS  negative genitourinary   Musculoskeletal negative musculoskeletal ROS (+)   Abdominal   Peds negative pediatric ROS (+)  Hematology negative hematology ROS (+)   Anesthesia Other Findings   Reproductive/Obstetrics (+) Pregnancy                             Anesthesia Physical Anesthesia Plan  ASA: 2  Anesthesia Plan: Epidural   Post-op Pain Management:    Induction:   PONV Risk Score and Plan:   Airway Management Planned:   Additional Equipment:   Intra-op Plan:   Post-operative Plan:   Informed Consent:   Plan Discussed with:   Anesthesia Plan Comments:         Anesthesia Quick Evaluation

## 2021-05-04 NOTE — Discharge Instructions (Signed)

## 2021-05-04 NOTE — Progress Notes (Signed)
Tracy Guzman is a 25 y.o. [id number] at [redacted]w[redacted]d   Subjective: Aware of contractions, pain score 4/10. Has doula inbound to bedside.   Objective: BP 112/70   Pulse 87   Temp 98.2 F (36.8 C) (Oral)   Resp 18   Ht 5\' 4"  (1.626 m)   Wt 75.4 kg   LMP 08/02/2020 (Exact Date)   BMI 28.55 kg/m  No intake/output data recorded. No intake/output data recorded.  FHT:  FHR: 135 bpm, variability: moderate,  accelerations:  Present,  decelerations:  Absent UC:   irregular, every 4-5 minutes SVE:   Dilation: 1.5 Effacement (%): 50 Station: -3 Exam by:: [email or web address] RN  Labs: Lab Results  Component Value Date   WBC 6.2 05/04/2021   HGB 11.5 (L) 05/04/2021   HCT 34.7 (L) 05/04/2021   MCV 78.0 (L) 05/04/2021   PLT 174 05/04/2021    Assessment / Plan: --25 y.o. 25 at [redacted]w[redacted]d admitted for elective IOL --Cat I tracing --GBS +, first dose PCN administered at 0827  --Vaginal Cytotec and foley balloon placed (60 mL) by CNM --Will assess indication for Pitocin titration when foley balloon is dislodged --Anticipate NSVD  Postpartum planning --Inpatient SW consult for MDD --Two week postpartum mood check  [redacted]w[redacted]d, CNM 05/04/2021, 10:35 AM

## 2021-05-04 NOTE — Progress Notes (Signed)
Tracy Guzman is a 25 y.o. [id number] at [redacted]w[redacted]d   Subjective: S/p epidural, denies pain, pressure, questions, concerns.   Objective: BP (!) 104/57   Pulse 83   Temp 97.8 F (36.6 C) (Oral)   Resp 16   Ht 5\' 4"  (1.626 m)   Wt 75.4 kg   LMP 08/02/2020 (Exact Date)   SpO2 100%   BMI 28.55 kg/m  No intake/output data recorded. No intake/output data recorded.  FHT:  FHR: 150 bpm, variability: moderate,  accelerations:  Present,  decelerations:  Present lates and variables UC:   regular, every 2-3 minutes SVE:   Dilation: 4.5 Effacement (%): 50 Station: -2 Exam by:: [email or web address] RN  Labs: Lab Results  Component Value Date   WBC 6.2 05/04/2021   HGB 11.5 (L) 05/04/2021   HCT 34.7 (L) 05/04/2021   MCV 78.0 (L) 05/04/2021   PLT 174 05/04/2021    Assessment / Plan: --25 y.o. G3P1102 at [redacted]w[redacted]d  --GBS +: 2nd PCN dose initiated at 1430 --Brief period of Cat II (lates and variables) while in immediate recovery from epidural --CNM at bedside, now Cat I, resting in right lateral --S/p Foley balloon and Cytotec x 1 --Assess for spontaneous cervical change x 4 hours, unless otherwise indicated by labor status --Anticipate SVD  [redacted]w[redacted]d, CNM 05/04/2021, 2:55 PM

## 2021-05-04 NOTE — Anesthesia Procedure Notes (Signed)
Epidural Patient location during procedure: OB Start time: 05/04/2021 2:05 PM End time: 05/04/2021 2:26 PM  Staffing Anesthesiologist: Lowella Curb, MD Performed: anesthesiologist   Preanesthetic Checklist Completed: patient identified, IV checked, site marked, risks and benefits discussed, surgical consent, monitors and equipment checked, pre-op evaluation and timeout performed  Epidural Patient position: sitting Prep: ChloraPrep Patient monitoring: heart rate, cardiac monitor, continuous pulse ox and blood pressure Approach: midline Location: L2-L3 Injection technique: LOR saline  Needle:  Needle type: Tuohy  Needle gauge: 17 G Needle length: 9 cm Needle insertion depth: 6 cm Catheter type: closed end flexible Catheter size: 20 Guage Catheter at skin depth: 10 cm Test dose: negative  Assessment Events: blood not aspirated, injection not painful, no injection resistance, no paresthesia and negative IV test  Additional Notes Reason for block:procedure for pain

## 2021-05-04 NOTE — H&P (Addendum)
OBSTETRIC ADMISSION HISTORY AND PHYSICAL  Tracy Guzman is a 25 y.o. female [phone] with IUP at [redacted]w[redacted]d by LMP presenting for elective IOL. She reports +FMs, No LOF, no VB, no blurry vision, headaches or peripheral edema, and RUQ pain.  She plans on breast and bottle feeding. She requests the patch for birth control. 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), LR NIPS, normal fetal echo MDD, h/o PTSD, ODD, SI- not active this pregnancy; seeing therapist Asthma Bartholin's gland cyst- at 35w H/o polysubstance use- not currently active  Past Medical History: Past Medical History:  Diagnosis Date   Allergy    Smoke, dust, Pollen   Asthma    Asthma    Depression    Endometriosis    GERD (gastroesophageal reflux disease)     Past Surgical History: Past Surgical History:  Procedure Laterality Date   MYRINGOTOMY     TONSILLECTOMY AND ADENOIDECTOMY  as infant   WRIST SURGERY  age 20    Obstetrical History: OB History     Gravida  3   Para  2   Term  1   Preterm  1   AB      Living  2      SAB      IAB      Ectopic      Multiple      Live Births  2           Social History Social History   Socioeconomic History   Marital status: Single    Spouse name: Not on file   Number of children: Not on file   Years of education: Not on file   Highest education level: Not on file  Occupational History   Occupation: 15: UNEMPLOYED    Comment: Home schooled  Tobacco Use   Smoking status: Former    Packs/day: 1.00    Years: 1.00    Pack years: 1.00    Types: Cigarettes   Smokeless tobacco: Never  Vaping Use   Vaping Use: Never used   Substance and Sexual Activity   Alcohol use: Not Currently    Comment: not since confirmed pregnancy   Drug use: Not Currently    Types: Other-see comments, MDMA (Ecstacy), Marijuana    Comment: last used end of December   Sexual activity: Yes    Partners: Male    Birth control/protection: None  Other Topics Concern   Not on file  Social History Narrative   ** Merged History Encounter **       Social Determinants of Health   Financial Resource Strain: Not on file  Food Insecurity: Not on file  Transportation Needs: Not on file  Physical Activity: Not on file  Stress: Not on file  Social Connections: Not on file    Family History: Family History  Problem Relation Age of Onset   Hypertension Mother    Hypertension Father    Depression Mother    Hyperlipidemia Mother    Depression Maternal Grandmother    Drug abuse Cousin    Drug abuse Maternal Uncle    Hyperlipidemia Father     Allergies: Allergies  Allergen Reactions   Lamotrigine Rash  Medications Prior to Admission  Medication Sig Dispense Refill Last Dose   albuterol (VENTOLIN HFA) 108 (90 Base) MCG/ACT inhaler Inhale 1-2 puffs into the lungs every 6 (six) hours as needed for wheezing or shortness of breath. 18 g 0    Blood Pressure Monitor DEVI Please check blood pressure 1-2 per week 1 each 0    Butalbital-APAP-Caffeine 50-325-40 MG capsule Take 1-2 capsules by mouth every 6 (six) hours as needed for headache. 30 capsule 0    cyclobenzaprine (FLEXERIL) 10 MG tablet Take 1 tablet (10 mg total) by mouth 3 (three) times daily as needed for muscle spasms. 30 tablet 1    prenatal vitamin w/FE, FA (PRENATAL 1 + 1) 27-1 MG TABS tablet Take 1 tablet by mouth daily at 12 noon.        Review of Systems   All systems reviewed and negative except as stated in HPI  Blood pressure 115/76, pulse 95, temperature 98.2 F (36.8 C), temperature source Oral, resp. rate 16, height 5\' 4"  (1.626 m), weight 75.4 kg, last  menstrual period 08/02/2020. General appearance: alert, cooperative, appears stated age, and no distress Lungs: clear to auscultation bilaterally Heart: regular rate and rhythm Abdomen: soft, non-tender; bowel sounds normal Pelvic: normal female genitalia Extremities: Homans sign is negative, no sign of DVT DTR's 2+ Presentation: cephalic Fetal monitoringBaseline: 140 bpm, Variability: Good {> 6 bpm), Accelerations: Reactive, and Decelerations: Absent Uterine activityFrequency: Every 3 minutes Dilation: 1.5 Effacement (%): 50 Station: -3 Exam by:: [email or web address] RN Seen by Luz Brazen, CNM   Prenatal labs: ABO, Rh: --/--/B POS (08/28 0751) Antibody: NEG (08/28 0751) Rubella: 1.01 (02/15 1057) RPR: Non Reactive (06/08 0950)  HBsAg: Negative (02/15 1057)  HIV: Non Reactive (06/08 0950)  GBS: Positive/-- (08/03 0212)  1 hr Glucola WNL Genetic screening  LR NIPS Anatomy 09[birth year]09-1997 WNL  Prenatal Transfer Tool  Maternal Diabetes: No Genetic Screening: Normal Maternal Ultrasounds/Referrals: Normal Fetal Ultrasounds or other Referrals:  Fetal echo Maternal Substance Abuse:  No Significant Maternal Medications:  None Significant Maternal Lab Results: Group B Strep positive  Results for orders placed or performed during the hospital encounter of 05/04/21 (from the past 24 hour(s))  CBC   Collection Time: 05/04/21  7:51 AM  Result Value Ref Range   WBC 6.2 4.0 - 10.5 K/uL   RBC 4.45 3.87 - 5.11 MIL/uL   Hemoglobin 11.5 (L) 12.0 - 15.0 g/dL   HCT 05/06/21 (L) 78.2 - 95.6 %   MCV 78.0 (L) 80.0 - 100.0 fL   MCH 25.8 (L) 26.0 - 34.0 pg   MCHC 33.1 30.0 - 36.0 g/dL   RDW 21.3 08.6 - 57.8 %   Platelets 174 150 - 400 K/uL   nRBC 0.0 0.0 - 0.2 %  Type and screen   Collection Time: 05/04/21  7:51 AM  Result Value Ref Range   ABO/RH(D) B POS    Antibody Screen NEG    Sample Expiration      05/07/2021,2359 Performed at [facility], [address] [place], [place]      Patient Active Problem List   Diagnosis Date Noted   Encounter for elective induction of labor 05/04/2021   [redacted] weeks gestation of pregnancy 04/09/2021   Bartholin's gland cyst 04/09/2021   Vaginal discharge during pregnancy in third trimester 11/19/2020   Back pain affecting pregnancy in second trimester 11/19/2020   Headache in pregnancy, antepartum, second trimester 11/19/2020   History of preterm delivery, currently pregnant  in third trimester 10/22/2020   H/O fetal anomaly in prior pregnancy, currently pregnant, second trimester 10/22/2020   Supervision of other normal pregnancy, antepartum 10/16/2020   MDD (major depressive disorder), recurrent episode, moderate (HCC) 08/16/2013   PTSD (post-traumatic stress disorder) 08/15/2013   Oppositional defiant disorder 06/06/2012   Suicidal ideation 02/12/2012   Polysubstance abuse (HCC) 02/12/2012   BACK PAIN 10/16/2010   ASTHMA 06/23/2010   GERD 06/23/2010    Assessment/Plan:  Tracy Guzman is a 25 y.o. L2X5170 at [redacted]w[redacted]d here for elective IOL.  #Labor: Will start with cytotec and FB. Will advance to pitocin as able #Pain: Epidural eventually #FWB: Cat I #ID:  GBS pos> PCN ppx #MOF: breast and bottle #MOC: patch #Circ:  Yes  #MDD: long mental health hx, but stable throughout this pregnancy, not on medications. Patient is seeing therapist. SW consult pp.  Shirlean Mylar, MD  05/04/2021, 9:40 AM   Attestation of Supervision of Student:  I confirm that I have verified the information documented in the  resident's  note and that I have also personally reperformed the history, physical exam and all medical decision making activities.  I have verified that all services and findings are accurately documented in this student's note; and I agree with management and plan as outlined in the documentation. I have also made any necessary editorial changes.  Calvert Cantor, CNM [facility] [employer], [facility] 05/04/2021 11:14 AM

## 2021-05-04 NOTE — Discharge Summary (Signed)
Postpartum Discharge Summary  Date of Service updated     Patient Name: Tracy Guzman DOB: 12-12-1995 MRN: [id number]  Date of admission: 05/04/2021 Delivery date:05/04/2021  Delivering provider: Darlina Rumpf  Date of discharge: 05/06/2021  Admitting diagnosis: Encounter for elective induction of labor [Z34.90] Intrauterine pregnancy: [redacted]w[redacted]d    Secondary diagnosis:  Active Problems:   MDD (major depressive disorder), recurrent episode, moderate ([place]   Encounter for elective induction of labor  Additional problems:    Discharge diagnosis: Term Pregnancy Delivered                                              Post partum procedures: N/A Augmentation: Cytotec and IP Foley Complications: None  Hospital course: Induction of Labor With Vaginal Delivery   25y.o. yo [id number] admitted to the hospital 05/04/2021 for induction of labor.  Indication for induction: Elective.  Patient had an uncomplicated labor course as follows: Membrane Rupture Time/Date: 3:01 PM ,05/04/2021   Delivery Method:Vaginal, Spontaneous  Episiotomy: None  Lacerations:  None  Details of delivery can be found in separate delivery note.  Patient had a routine postpartum course. Patient is discharged home 05/06/21.  Newborn Data: Birth date:05/04/2021  Birth time:6:26 PM  Gender:Female  Living status:Living  Apgars:9 ,9  Weight:3905 g   Magnesium Sulfate received: No BMZ received: No Rhophylac:N/A MMR:N/A T-DaP:Given prenatally Flu: N/A Transfusion:No  Physical exam  Vitals:   05/05/21 0603 05/05/21 1100 05/05/21 1934 05/06/21 0459  BP: (!) 104/54 101/60 (!) 111/55 113/75  Pulse: 62 (!) 59 84 62  Resp: '18 16 16 16  ' Temp: 97.8 F (36.6 C) 97.9 F (36.6 C) 98.2 F (36.8 C) 98 F (36.7 C)  TempSrc: Oral Oral Oral Oral  SpO2: 100% 99% 98% 98%  Weight:      Height:       General: alert, cooperative, and no distress Lochia: appropriate Uterine Fundus: firm Incision:  N/A DVT Evaluation: No significant calf/ankle edema. Labs: Lab Results  Component Value Date   WBC 6.2 05/04/2021   HGB 11.5 (L) 05/04/2021   HCT 34.7 (L) 05/04/2021   MCV 78.0 (L) 05/04/2021   PLT 174 05/04/2021   CMP Latest Ref Rng & Units 04/13/2021  Glucose 70 - 99 mg/dL 102(H)  BUN 6 - 20 mg/dL 5(L)  Creatinine 0.44 - 1.00 mg/dL 0.52  Sodium 135 - 145 mmol/L 135  Potassium 3.5 - 5.1 mmol/L 3.7  Chloride 98 - 111 mmol/L 105  CO2 22 - 32 mmol/L 21(L)  Calcium 8.9 - 10.3 mg/dL 9.0  Total Protein 6.5 - 8.1 g/dL 6.1(L)  Total Bilirubin 0.3 - 1.2 mg/dL 1.0  Alkaline Phos 38 - 126 U/L 137(H)  AST 15 - 41 U/L 25  ALT 0 - 44 U/L 20   Edinburgh Score: Edinburgh Postnatal Depression Scale Screening Tool 05/05/2021  I have been able to laugh and see the funny side of things. 0  I have looked forward with enjoyment to things. 0  I have blamed myself unnecessarily when things went wrong. 1  I have been anxious or worried for no good reason. 1  I have felt scared or panicky for no good reason. 0  Things have been getting on top of me. 1  I have been so unhappy that I have had difficulty sleeping. 0  I  have felt sad or miserable. 1  I have been so unhappy that I have been crying. 0  The thought of harming myself has occurred to me. 0  Edinburgh Postnatal Depression Scale Total 4     After visit meds:  Allergies as of 05/06/2021       Reactions   Lamotrigine Rash        Medication List     STOP taking these medications    Butalbital-APAP-Caffeine 50-325-40 MG capsule   cyclobenzaprine 10 MG tablet Commonly known as: FLEXERIL       TAKE these medications    acetaminophen 325 MG tablet Commonly known as: Tylenol Take 2 tablets (650 mg total) by mouth every 4 (four) hours as needed (for pain scale < 4).   albuterol 108 (90 Base) MCG/ACT inhaler Commonly known as: VENTOLIN HFA Inhale 1-2 puffs into the lungs every 6 (six) hours as needed for wheezing or shortness  of breath.   Blood Pressure Monitor Devi Please check blood pressure 1-2 per week   fluconazole 150 MG tablet Commonly known as: Diflucan Take 1 tablet (150 mg total) by mouth every 3 (three) days. Take one tab, if still symptoms take 2nd in 72 hours   ibuprofen 600 MG tablet Commonly known as: ADVIL Take 1 tablet (600 mg total) by mouth every 6 (six) hours.   prenatal vitamin w/FE, FA 27-1 MG Tabs tablet Take 1 tablet by mouth daily at 12 noon.       Discharge home in stable condition Infant Feeding: Breast Infant Disposition:home with mother Discharge instruction: per After Visit Summary and Postpartum booklet. Activity: Advance as tolerated. Pelvic rest for 6 weeks.  Diet: routine diet Future Appointments: Future Appointments  Date Time Provider [place]  06/02/2021  1:10 PM Shelly Bombard, MD [place] None   Follow up Visit: Visit message sent by Maryelizabeth Kaufmann, CNM 05/04/2021  Please schedule this patient for a In person postpartum visit in 4 weeks with the following provider: Any provider. Additional Postpartum F/U:Postpartum Depression checkup  Low risk pregnancy complicated by:  N/A Delivery mode:  Vaginal, Spontaneous  Anticipated Birth Control:   Patch   05/06/2021 Patriciaann Clan, DO

## 2021-05-05 ENCOUNTER — Encounter (HOSPITAL_COMMUNITY): Payer: Self-pay | Admitting: Obstetrics and Gynecology

## 2021-05-05 NOTE — Social Work (Signed)
Per Intake Supervisor Debbie Legenetti the CPS report has been screened out and there are no concerns at this time.   There are no barriers to discharge.    Matika Bartell, MSW, LCSW [facility]  Clinical Social Worker  [phone] 05/05/2021  3:01 PM  

## 2021-05-05 NOTE — Anesthesia Postprocedure Evaluation (Signed)
Anesthesia Post Note  Patient: Pharmacist, community  Procedure(s) Performed: AN AD HOC LABOR EPIDURAL     Patient location during evaluation: Mother Baby Anesthesia Type: Epidural Level of consciousness: awake and alert Pain management: pain level controlled Vital Signs Assessment: post-procedure vital signs reviewed and stable Respiratory status: spontaneous breathing, nonlabored ventilation and respiratory function stable Cardiovascular status: stable Postop Assessment: no headache, no backache, epidural receding, no apparent nausea or vomiting, patient able to bend at knees, adequate PO intake and able to ambulate Anesthetic complications: no   No notable events documented.  Last Vitals:  Vitals:   05/05/21 0210 05/05/21 0603  BP: 103/62 (!) 104/54  Pulse: 64 62  Resp: 18 18  Temp: 36.5 C 36.6 C  SpO2: 100% 100%    Last Pain:  Vitals:   05/05/21 0603  TempSrc: Oral  PainSc: 0-No pain   Pain Goal:                   [place]

## 2021-05-05 NOTE — Progress Notes (Signed)
Post Partum Day 1 Subjective: no complaints, up ad lib, voiding, and tolerating PO  Objective: Blood pressure (!) 104/54, pulse 62, temperature 97.8 F (36.6 C), temperature source Oral, resp. rate 18, height 5\' 4"  (1.626 m), weight 75.4 kg, last menstrual period 08/02/2020, SpO2 100 %, unknown if currently breastfeeding.  Physical Exam:  General: alert, cooperative, and no distress Lochia: appropriate Uterine Fundus: firm Incision: n/a DVT Evaluation: No evidence of DVT seen on physical exam.  Recent Labs    05/04/21 0751  HGB 11.5*  HCT 34.7*    Assessment/Plan: Plan for discharge tomorrow and Breastfeeding   LOS: 1 day   05/06/21 05/05/2021, 8:02 AM

## 2021-05-05 NOTE — Clinical Social Work Maternal (Signed)
CLINICAL SOCIAL WORK MATERNAL/CHILD NOTE  Patient Details  Name: Tracy Guzman MRN: [id number] Date of Birth: 11-12-1995  Date:  05/05/2021  Clinical Social Worker Initiating Note:  Tracy Guzman, [place] Date/Time: Initiated:  05/05/21/0930     Child's Name:  Tracy Guzman   Biological Parents:  Mother   Need for Interpreter:  None   Reason for Referral:  Behavioral Health Concerns   Address:  [place] [address]    Phone number:  [phone])     Additional phone number:   Household Members/Support Persons (HM/SP):   Household Member/Support Person 1   HM/SP Name Relationship DOB or Age  HM/SP -1        HM/SP -2        HM/SP -3        HM/SP -4        HM/SP -5        HM/SP -6        HM/SP -7        HM/SP -8          Natural Supports (not living in the home):  Children, Immediate Family   Professional Supports: Case Metallurgist, Transport planner   Employment: Full-time   Type of Work: Air cabin crew   Education:      Homebound arranged:    Museum/gallery curator Resources:  Medicaid   Other Resources:  [facility]   Cultural/Religious Considerations Which May Impact Care:    Strengths:  Ability to meet basic needs  , Home prepared for child  , Pediatrician chosen   Psychotropic Medications:         Pediatrician:    Careers adviser area  Pediatrician List:   Ecologist Other ([facility])  [place]      Pediatrician Fax Number:    Risk Factors/Current Problems:      Cognitive State:  Able to Concentrate  , Alert  , Linear Thinking  , Insightful     Mood/Affect:  Calm  , Comfortable  , Bright     CSW Assessment: CSW Assessment: CSW received consult for mental health. CSW met with [name] to offer support and complete assessment.    CSW met with [name] at bedside to discuss history of mental health and offer resources. CSW congratulated [name] and  introduced CSW role. CSW observed [name] lying in bed and infant resting in bassinet. [name] presented calm and welcoming of CSW. [name] confirmed the demographic information on hospital file is correct. [name] reported she lives alone, and that FOB is not involved with the infant and did not share FOB name. CSW inquired how [name] has felt emotionally since giving birth. [name] expressed feeling good and disclosed this was the most pleasant birthing experience she has had. [name] shared she had support from her sister via facetime and her doula during labor and delivery.   CSW inquired about [name] history of MDD, PTSD, ODD and SI. [name] shared she was diagnosed with depression, anxiety, PTSD in middle school. [name] shared she does not take medication for her symptoms but is currently seeing a therapist. [name] shared she had done well emotionally until the end of her pregnancy when she started to feel overwhelmed and anxious about the birth. [name] reported she discussed her concerns with her doctor and was referred to the integrated behavioral health specialist Tracy Guzman for therapy. [name] shared she had 2-3 visits which  have been helpful. [name] shared she prefers long term therapy, so her care manager through [facility] is helping to coordinate this for her. [name] shared she will continue to see the [facility] specialist until then and is expecting a follow up phone call from the therapist at the end of this week. CSW inquired if [name] experienced postpartum depression with her older two children. [name] shared she did not experience postpartum depression and if she did, she does not recall due to other stressors at the time. CSW assessed [name] for safety. [name] denied thoughts of harm to self and others. [name] denies domestic violence. CSW provided education regarding the baby blues period vs. perinatal mood disorders, discussed treatment and gave resources for mental health follow. CSW recommended self-evaluation during the postpartum time period using the New Mom  Checklist from Postpartum Progress and encouraged [name] to contact a medical professional if symptoms are noted at any time. [name] shared she feels comfortable reaching out her doctor and community support.   CSW inquired about [name] supports. [name] shared she does not have immediate family support in the area but has good community support. [name] shared she has been active with the [facility] of the [employer] program since March. She has a case manager Tracy Guzman that provides home visits, parental education, childcare support, and goal setting. She also has a pregnancy care manager Tracy Guzman through [facility]. [name] shared both case managers will continue to provide services postpartum. CSW praised [name] for her efforts and being proactive. CSW inquired about [name] older children. [name] shared her older children Tracy Guzman (06-01-2015) and Tracy Guzman (04-23-2016) live with paternal grandmother because she and their father lost custody in 2020. [name] shared she had an open case with [place] for neglect. [name] disclosed her son Tracy Guzman was born with congenital anomalies. She missed the infant's follow up appointments and did know he had immediate medical concerns until he follow up with a medical provider. [name] shared the CPS case was substantiated and has since been closed. [name] shared she still has parental rights and sees her children and has a good relationship with them. CSW informed [name] per policy due to children not being in her custody a report will be made to CPS. [name] reported understanding.   CSW provided review of Sudden Infant Death Syndrome (SIDS) precautions. [name] shared she has all essential items for the infant such as diapers, wipes, care seat and a bassinet where the infant will sleep. [name] has chosen [facility] in [place] for infant's follow up care. [name] reported she uses [facility] transportation for her  appointments but plans to use [facility] transportation services for infants follow up appointments. [name] shared she receives [facility] benefits. CSW offered [name] additional community resources- [employer] and Mother support groups. [name] was very appreciative of the resources provided.   -referral made to family connect. -CSW made a report to [facility] CPS.    CSW Plan/Description:  Perinatal Mood and Anxiety Disorder (PMADs) Education, Sudden Infant Death Syndrome (SIDS) Education, Child Protective Service Report      Lia Hopping, [place] 05/05/2021, 12:26PM

## 2021-05-06 ENCOUNTER — Other Ambulatory Visit (HOSPITAL_COMMUNITY): Payer: Self-pay

## 2021-05-06 MED ORDER — ACETAMINOPHEN 325 MG PO TABS: 650.0000 mg | ORAL_TABLET | ORAL | Status: DC | PRN

## 2021-05-06 MED ORDER — IBUPROFEN 600 MG PO TABS
600.0000 mg | ORAL_TABLET | Freq: Four times a day (QID) | ORAL | 0 refills | Status: DC
  Filled 2021-05-06: qty 30, 8d supply, fill #0

## 2021-05-06 MED ORDER — IBUPROFEN 600 MG PO TABS: 600.0000 mg | ORAL_TABLET | Freq: Four times a day (QID) | ORAL | 0 refills | Status: DC

## 2021-05-06 MED ORDER — FLUCONAZOLE 150 MG PO TABS
150.0000 mg | ORAL_TABLET | ORAL | 0 refills | Status: DC
Start: 2021-05-06 — End: 2021-05-06

## 2021-05-06 MED ORDER — FLUCONAZOLE 150 MG PO TABS
150.0000 mg | ORAL_TABLET | ORAL | 0 refills | Status: DC
  Filled 2021-05-06: qty 2, 6d supply, fill #0

## 2021-05-15 ENCOUNTER — Telehealth (HOSPITAL_COMMUNITY): Payer: Self-pay | Admitting: *Deleted

## 2021-05-15 NOTE — Telephone Encounter (Signed)
Hospital Discharge Follow-up Call:  Patient reports that she is doing well and has no concerns about her healing process.  EPDS today was 0 and patient says this accurately reflects that she is doing well emotionally.  Patient says that baby is doing well overall.  She reports that she has been communicating with pediatrician about baby not stooling often.  Next office appointment is Sept 12.  Patient says that baby sleeps in a bassinet.  Reviewed ABCs of Safe Sleep.

## 2021-05-17 IMAGING — US US OB COMP LESS 14 WK
1 series · 16 of 24 positions shown · non-contrast
Comparison: None.

CLINICAL DATA: Vaginal bleeding

EXAM:
OBSTETRIC <14 WK ULTRASOUND
TECHNIQUE: Transabdominal ultrasound was performed for evaluation of the
gestation as well as the maternal uterus and adnexal regions.

[Series 1: us ob comp less 14 wk · 24 acquisitions, 16 frames shown]
[im 1/24]
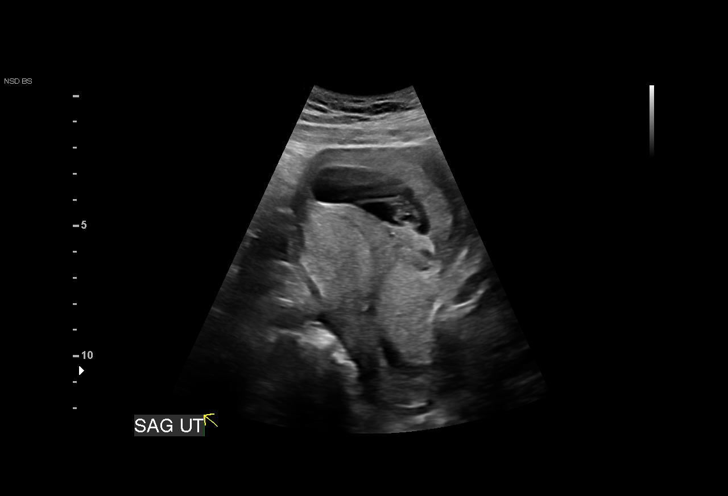
[im 3/24]
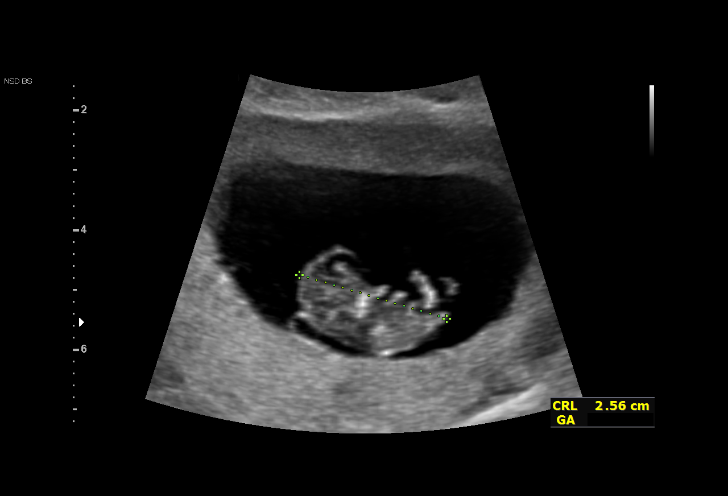
[im 4/24]
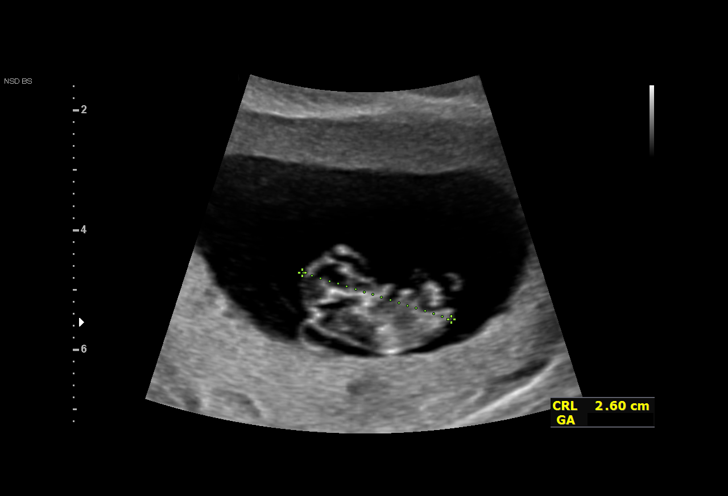
[im 6/24]
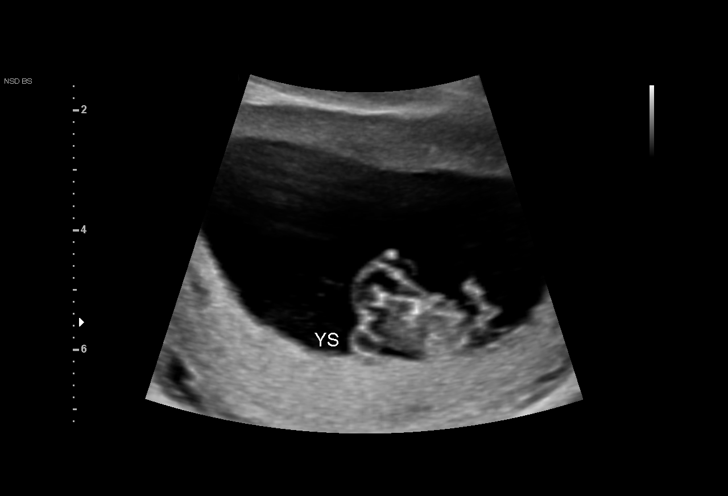
[im 7/24]
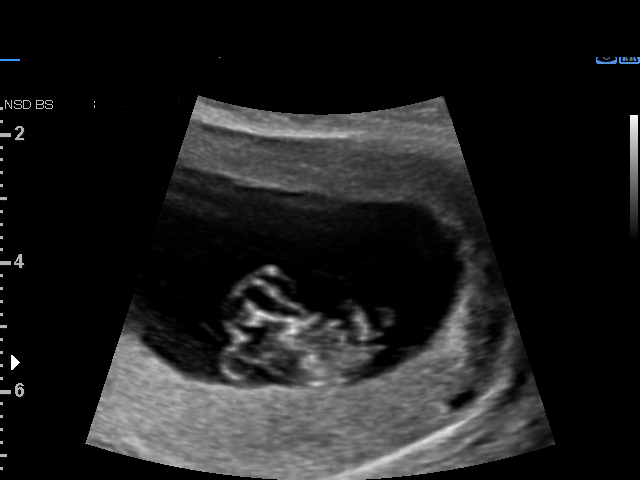
[im 9/24]
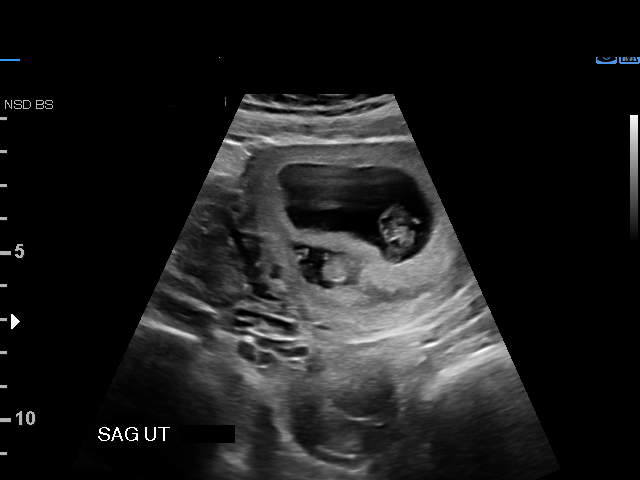
[im 10/24]
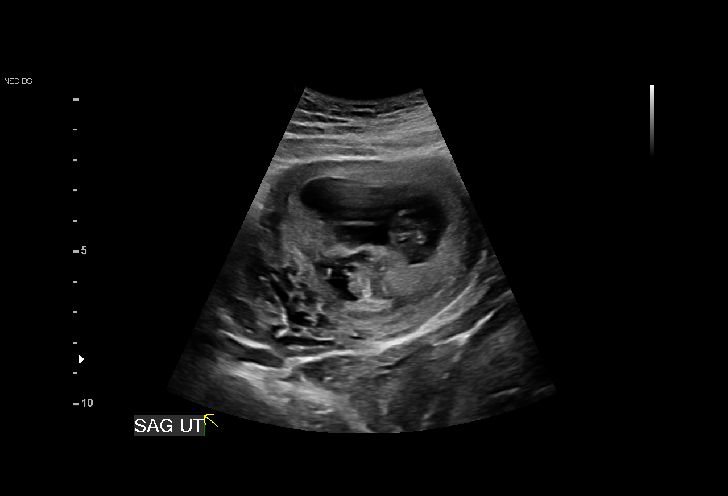
[im 12/24]
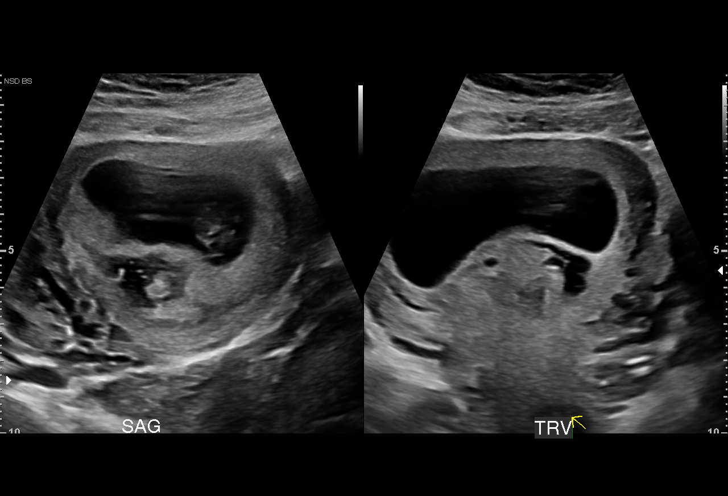
[im 13/24]
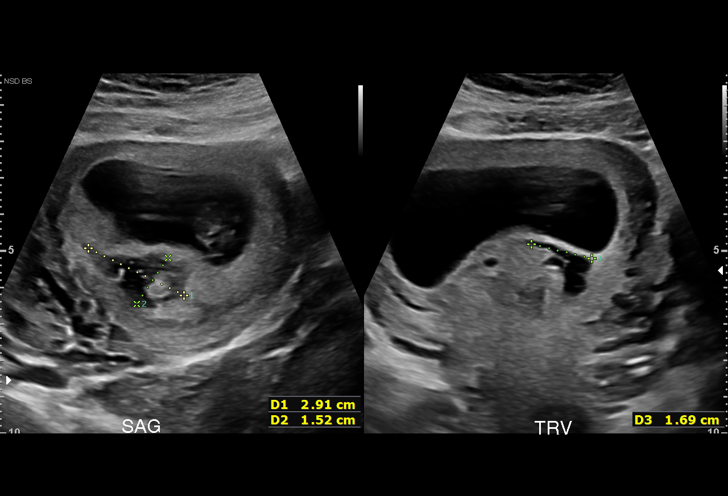
[im 15/24]
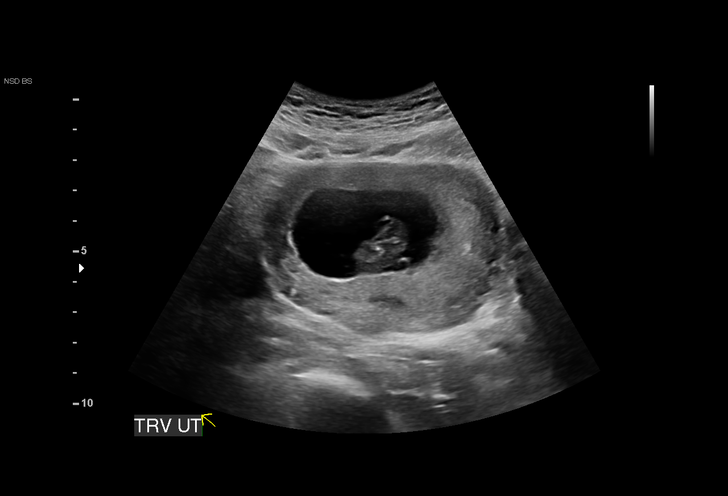
[im 16/24]
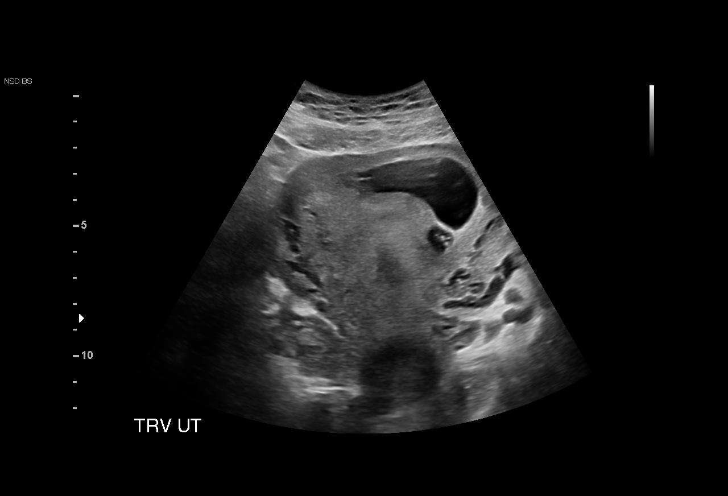
[im 18/24]
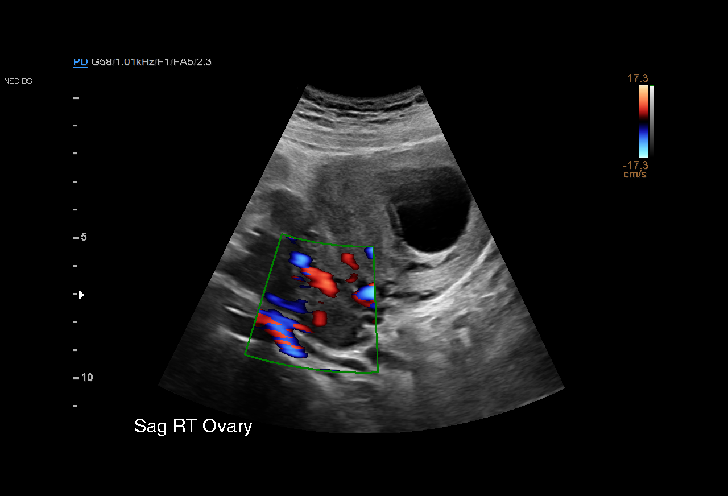
[im 19/24]
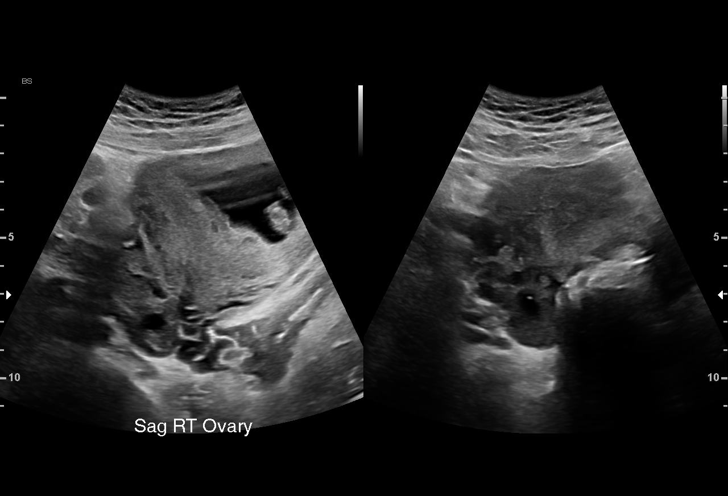
[im 21/24]
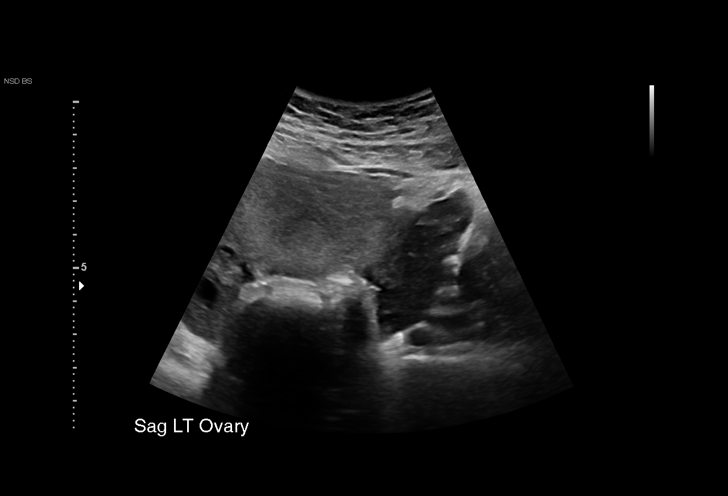
[im 22/24]
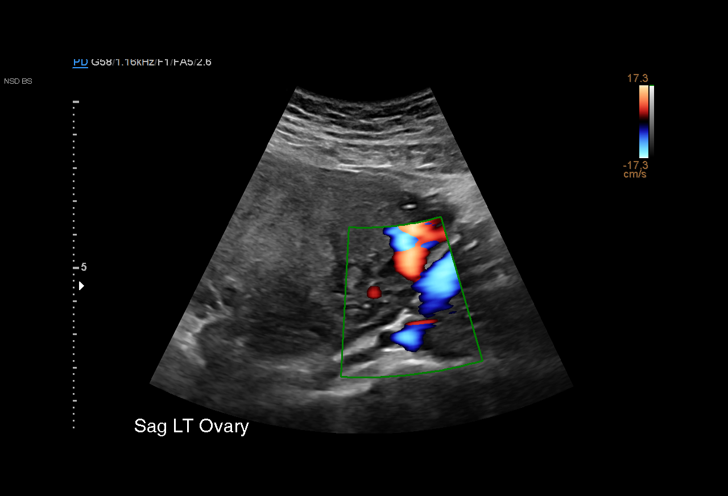
[im 24/24]
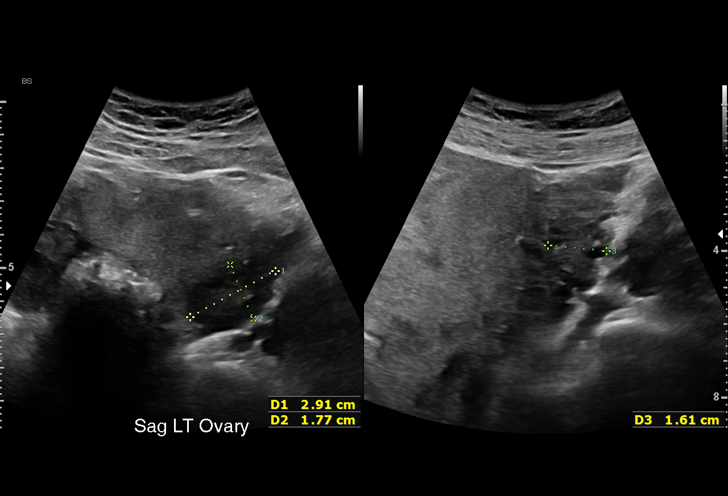

[16 of 24 positions shown; findings below may reference images not displayed]

FINDINGS: Intrauterine gestational sac: Present

Yolk sac:  Present

Embryo:  Present

Cardiac Activity: Present

Heart Rate: 177 bpm

CRL:   25.7 mm   9 w 2 d                  US EDC: 05/09/2021

Subchorionic hemorrhage:  Small subchorionic hemorrhage is noted.

Maternal uterus/adnexae: Ovaries are well visualized and within
normal limits.
IMPRESSION: Single live intrauterine gestation at 9 weeks 2 days.

Small subchorionic hemorrhage is noted.

## 2021-06-02 ENCOUNTER — Ambulatory Visit: Payer: Medicaid Other | Admitting: Obstetrics and Gynecology

## 2021-06-02 ENCOUNTER — Telehealth: Payer: Medicaid Other | Admitting: Obstetrics

## 2021-06-02 ENCOUNTER — Encounter: Payer: Self-pay | Admitting: Obstetrics

## 2021-06-02 NOTE — Progress Notes (Signed)
Patient did not answer phone.  Left message.  Brock Bad, MD 06/02/2021 2:44 PM

## 2021-07-16 ENCOUNTER — Ambulatory Visit: Payer: Medicaid Other | Admitting: Obstetrics & Gynecology

## 2022-07-21 ENCOUNTER — Ambulatory Visit (INDEPENDENT_AMBULATORY_CARE_PROVIDER_SITE_OTHER): Payer: Medicaid Other | Admitting: General Practice

## 2022-07-21 VITALS — BP 126/81 | HR 91 | Ht 64.0 in | Wt 138.4 lb

## 2022-07-21 DIAGNOSIS — Z8659 Personal history of other mental and behavioral disorders: Secondary | ICD-10-CM

## 2022-07-21 DIAGNOSIS — Z3200 Encounter for pregnancy test, result unknown: Secondary | ICD-10-CM

## 2022-07-21 DIAGNOSIS — Z3201 Encounter for pregnancy test, result positive: Secondary | ICD-10-CM | POA: Diagnosis not present

## 2022-07-21 LAB — POCT URINE PREGNANCY: Preg Test, Ur: POSITIVE — AB

## 2022-07-21 NOTE — Progress Notes (Signed)
Patient was assessed and managed by nursing staff during this encounter. I have reviewed the chart and agree with the documentation and plan. I have also made any necessary editorial changes.  Shawne Eskelson A Davinci Glotfelty, MD 07/21/2022 10:59 AM   

## 2022-07-21 NOTE — Progress Notes (Signed)
Ms. Tracy Guzman presents today for UPT. She has no unusual complaints. LMP: 06-10-22    OBJECTIVE: Appears well, in no apparent distress.  OB History     Gravida  3   Para  3   Term  2   Preterm  1   AB      Living  3      SAB      IAB      Ectopic      Multiple  0   Live Births  3          Home UPT Result: Positive x4 In-Office UPT result: Positive I have reviewed the patient's medical, obstetrical, social, and family histories, and medications.   ASSESSMENT: Positive pregnancy test  PLAN Prenatal care to be completed at: [facility]   Pt requesting counseling services. States she has Hx of depression.

## 2022-08-11 ENCOUNTER — Institutional Professional Consult (permissible substitution): Payer: Self-pay | Admitting: Licensed Clinical Social Worker

## 2022-08-11 ENCOUNTER — Institutional Professional Consult (permissible substitution): Payer: Medicaid Other | Admitting: Licensed Clinical Social Worker

## 2022-08-14 ENCOUNTER — Encounter: Payer: Self-pay | Admitting: Obstetrics

## 2022-08-20 ENCOUNTER — Telehealth: Payer: Self-pay | Admitting: Emergency Medicine

## 2022-08-20 ENCOUNTER — Inpatient Hospital Stay (HOSPITAL_COMMUNITY)
Admission: AD | Admit: 2022-08-20 | Discharge: 2022-08-20 | Disposition: A | Discharge status: Home / Self Care | Payer: Medicaid Other | Attending: Obstetrics and Gynecology | Admitting: Obstetrics and Gynecology

## 2022-08-20 ENCOUNTER — Encounter (HOSPITAL_COMMUNITY): Payer: Self-pay | Admitting: Obstetrics and Gynecology

## 2022-08-20 DIAGNOSIS — O23591 Infection of other part of genital tract in pregnancy, first trimester: Secondary | ICD-10-CM | POA: Diagnosis not present

## 2022-08-20 DIAGNOSIS — R109 Unspecified abdominal pain: Secondary | ICD-10-CM | POA: Insufficient documentation

## 2022-08-20 DIAGNOSIS — Z79899 Other long term (current) drug therapy: Secondary | ICD-10-CM | POA: Insufficient documentation

## 2022-08-20 DIAGNOSIS — B9689 Other specified bacterial agents as the cause of diseases classified elsewhere: Secondary | ICD-10-CM

## 2022-08-20 DIAGNOSIS — Z3A10 10 weeks gestation of pregnancy: Secondary | ICD-10-CM | POA: Diagnosis not present

## 2022-08-20 DIAGNOSIS — O26891 Other specified pregnancy related conditions, first trimester: Secondary | ICD-10-CM | POA: Diagnosis not present

## 2022-08-20 LAB — WET PREP, GENITAL
Sperm: NONE SEEN
Trich, Wet Prep: NONE SEEN
WBC, Wet Prep HPF POC: >=10 — AB (ref <10)
Yeast Wet Prep HPF POC: NONE SEEN

## 2022-08-20 LAB — URINALYSIS, ROUTINE W REFLEX MICROSCOPIC
Bilirubin Urine: NEGATIVE
Glucose, UA: NEGATIVE mg/dL
Hgb urine dipstick: NEGATIVE
Ketones, ur: NEGATIVE mg/dL
Nitrite: NEGATIVE
Protein, ur: NEGATIVE mg/dL
Specific Gravity, Urine: 1.021 (ref 1.005–1.030)
pH: 6 (ref 5.0–8.0)

## 2022-08-20 MED ORDER — METRONIDAZOLE 0.75 % VA GEL: 1.0000 | Freq: Every day | VAGINAL | 1 refills | Status: DC

## 2022-08-20 NOTE — MAU Provider Note (Signed)
History     FE:[id number]  Arrival date and time: 08/20/22 1313    Chief Complaint  Patient presents with   Abdominal Pain     HPI Tracy Guzman is a 26 y.o. at [redacted]w[redacted]d by sure LMP, who presents for pelvic pain.   Patient reports cramping and pelvic pain for the past two days No vaginal bleeding Sure of her dates No vaginal discharge or odor No burning or pain with urination No significant n/v No diarrhea     OB History     Gravida  4   Para  3   Term  2   Preterm  1   AB      Living  3      SAB      IAB      Ectopic      Multiple  0   Live Births  3           Past Medical History:  Diagnosis Date   Allergy    Smoke, dust, Pollen   Asthma    Asthma    Depression    Endometriosis    GERD (gastroesophageal reflux disease)     Past Surgical History:  Procedure Laterality Date   MYRINGOTOMY     TONSILLECTOMY AND ADENOIDECTOMY  as infant   WRIST SURGERY  age 37    Family History  Problem Relation Age of Onset   Hypertension Mother    Hypertension Father    Depression Mother    Hyperlipidemia Mother    Depression Maternal Grandmother    Drug abuse Cousin    Drug abuse Maternal Uncle    Hyperlipidemia Father     Social History   Socioeconomic History   Marital status: Single    Spouse name: Not on file   Number of children: Not on file   Years of education: Not on file   Highest education level: Not on file  Occupational History   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  Tobacco Use   Smoking status: Every Day    Packs/day: 0.25    Years: 1.00    Total pack years: 0.25    Types: Cigarettes   Smokeless tobacco: Never  Vaping Use   Vaping Use: Never used  Substance and Sexual Activity   Alcohol use: Not Currently    Comment: not since confirmed pregnancy   Drug use: Not Currently    Types: Other-see comments, MDMA (Ecstacy), Marijuana    Comment: last used end of December   Sexual  activity: Yes    Partners: Male    Birth control/protection: None  Other Topics Concern   Not on file  Social History Narrative   ** Merged History Encounter **       Social Determinants of Health   Financial Resource Strain: Not on file  Food Insecurity: Not on file  Transportation Needs: Not on file  Physical Activity: Not on file  Stress: Not on file  Social Connections: Not on file  Intimate Partner Violence: Not on file    Allergies  Allergen Reactions   Lamotrigine Rash    No current facility-administered medications on file prior to encounter.   Current Outpatient Medications on File Prior to Encounter  Medication Sig Dispense Refill   acetaminophen (TYLENOL) 325 MG tablet Take 2 tablets (650 mg total) by mouth every 4 (four) hours as needed (for pain scale < 4).     albuterol (VENTOLIN  HFA) 108 (90 Base) MCG/ACT inhaler Inhale 1-2 puffs into the lungs every 6 (six) hours as needed for wheezing or shortness of breath. 18 g 0   Blood Pressure Monitor DEVI Please check blood pressure 1-2 per week (Patient not taking: Reported on 07/21/2022) 1 each 0   fluconazole (DIFLUCAN) 150 MG tablet Take 1 tablet (150 mg total) by mouth every 3 (three) days. Take one tab, if still symptoms take 2nd in 72 hours (Patient not taking: Reported on 07/21/2022) 2 tablet 0   ibuprofen (ADVIL) 600 MG tablet Take 1 tablet (600 mg total) by mouth every 6 (six) hours. (Patient not taking: Reported on 07/21/2022) 30 tablet 0   prenatal vitamin w/FE, FA (PRENATAL 1 + 1) 27-1 MG TABS tablet Take 1 tablet by mouth daily at 12 noon. (Patient not taking: Reported on 07/21/2022)     [DISCONTINUED] mometasone-formoterol (DULERA) 100-5 MCG/ACT AERO Inhale 2 puffs into the lungs 2 (two) times daily.     [DISCONTINUED] promethazine (PHENERGAN) 25 MG suppository Place 1 suppository (25 mg total) rectally every 6 (six) hours as needed for nausea or vomiting. 10 suppository 0     ROS Pertinent positives and  negative per HPI, all others reviewed and negative  Physical Exam   BP (!) 104/56   Pulse 74   Temp 98.2 F (36.8 C)   Resp 18   Ht 5\' 4"  (1.626 m)   Wt 63.5 kg   LMP 06/10/2022   BMI 24.03 kg/m   Patient Vitals for the past 24 hrs:  BP Temp Pulse Resp Height Weight  08/20/22 1355 (!) 104/56 98.2 F (36.8 C) 74 18 5\' 4"  (1.626 m) 63.5 kg    Physical Exam Vitals reviewed.  Constitutional:      General: She is not in acute distress.    Appearance: She is well-developed. She is not diaphoretic.  Eyes:     General: No scleral icterus. Pulmonary:     Effort: Pulmonary effort is normal. No respiratory distress.  Abdominal:     General: There is no distension.     Palpations: Abdomen is soft.     Tenderness: There is no abdominal tenderness. There is no guarding or rebound.  Skin:    General: Skin is warm and dry.  Neurological:     Mental Status: She is alert.     Coordination: Coordination normal.      Cervical Exam    Bedside Ultrasound Pt informed that the ultrasound is considered a limited OB ultrasound and is not intended to be a complete ultrasound exam.  Patient also informed that the ultrasound is not being completed with the intent of assessing for fetal or placental anomalies or any pelvic abnormalities.  Explained that the purpose of today's ultrasound is to assess for  viability.  Patient acknowledges the purpose of the exam and the limitations of the study.    My interpretation: viable IUP with FHR 160 bpm by M mode   Labs Results for orders placed or performed during the hospital encounter of 08/20/22 (from the past 24 hour(s))  Urinalysis, Routine w reflex microscopic Urine, Clean Catch     Status: Abnormal   Collection Time: 08/20/22  2:06 PM  Result Value Ref Range   Color, Urine YELLOW YELLOW   APPearance HAZY (A) CLEAR   Specific Gravity, Urine 1.021 1.005 - 1.030   pH 6.0 5.0 - 8.0   Glucose, UA NEGATIVE NEGATIVE mg/dL   Hgb urine dipstick  NEGATIVE NEGATIVE  Bilirubin Urine NEGATIVE NEGATIVE   Ketones, ur NEGATIVE NEGATIVE mg/dL   Protein, ur NEGATIVE NEGATIVE mg/dL   Nitrite NEGATIVE NEGATIVE   Leukocytes,Ua MODERATE (A) NEGATIVE   RBC / HPF 0-5 0 - 5 RBC/hpf   WBC, UA 11-20 0 - 5 WBC/hpf   Bacteria, UA FEW (A) NONE SEEN   Squamous Epithelial / LPF 11-20 0 - 5   Mucus PRESENT   Wet prep, genital     Status: Abnormal   Collection Time: 08/20/22  2:30 PM  Result Value Ref Range   Yeast Wet Prep HPF POC NONE SEEN NONE SEEN   Trich, Wet Prep NONE SEEN NONE SEEN   Clue Cells Wet Prep HPF POC PRESENT (A) NONE SEEN   WBC, Wet Prep HPF POC >=10 (A) <10   Sperm NONE SEEN     Imaging No results found.  MAU Course  Procedures Lab Orders         Wet prep, genital         Urinalysis, Routine w reflex microscopic Urine, Clean Catch    Meds ordered this encounter  Medications   metroNIDAZOLE (METROGEL) 0.75 % vaginal gel    Sig: Place 1 Applicatorful vaginally at bedtime. Apply one applicatorful to vagina at bedtime for 5 days    Dispense:  70 g    Refill:  1   Imaging Orders  No imaging studies ordered today    MDM moderate  Assessment and Plan  #Abdominal pain in pregnancy, first trimester  #[redacted] weeks gestation of pregnancy Viable IUP seen on [place], as well as possible [facility], which is the likely etiology of her pain vs round ligament pain. We discussed that 80-90% of patients will go on to have a normal pregnancy with a live delivery. The remainder are at increased risk for miscarriage, unfortunately there are no known interventions to mitigate this risk. Rhogam not indicated. We discussed return precautions including crescendo abdominal pain, heavy vaginal bleeding soaking >1 pad/hour, and fever.  #BV +wet prep, rx sent for metrogel.   Dispo: discharged to home in stable condition.    Clarnce Flock, MD/MPH 08/20/22 3:05 PM  Allergies as of 08/20/2022       Reactions   Lamotrigine Rash         Medication List     STOP taking these medications    ibuprofen 600 MG tablet Commonly known as: ADVIL       TAKE these medications    acetaminophen 325 MG tablet Commonly known as: Tylenol Take 2 tablets (650 mg total) by mouth every 4 (four) hours as needed (for pain scale < 4).   albuterol 108 (90 Base) MCG/ACT inhaler Commonly known as: VENTOLIN HFA Inhale 1-2 puffs into the lungs every 6 (six) hours as needed for wheezing or shortness of breath.   Blood Pressure Monitor Devi Please check blood pressure 1-2 per week   fluconazole 150 MG tablet Commonly known as: Diflucan Take 1 tablet (150 mg total) by mouth every 3 (three) days. Take one tab, if still symptoms take 2nd in 72 hours   metroNIDAZOLE 0.75 % vaginal gel Commonly known as: METROGEL Place 1 Applicatorful vaginally at bedtime. Apply one applicatorful to vagina at bedtime for 5 days   prenatal vitamin w/FE, FA 27-1 MG Tabs tablet Take 1 tablet by mouth daily at 12 noon.

## 2022-08-20 NOTE — MAU Note (Signed)
.  Tracy Guzman is a 26 y.o. at [redacted]w[redacted]d here in MAU reporting: sever abd pain and cramping for the past 2 days. Has gotten worse today. Pain is cramping and goes from right to left and in middle. Denies any vag bleeding or discharge.  LMP:  Onset of complaint: 2 days Pain score: 7 Vitals:   08/20/22 1355  BP: (!) 104/56  Pulse: 74  Resp: 18  Temp: 98.2 F (36.8 C)     FHT: unable  Lab orders placed from triage:  u/a

## 2022-08-20 NOTE — Telephone Encounter (Signed)
Attempted RC to patient. Pt has c/o abdominal pain. Seen at MAU following voicemail.

## 2022-08-21 ENCOUNTER — Ambulatory Visit (INDEPENDENT_AMBULATORY_CARE_PROVIDER_SITE_OTHER): Payer: Medicaid Other

## 2022-08-21 ENCOUNTER — Ambulatory Visit: Payer: Medicaid Other | Admitting: *Deleted

## 2022-08-21 VITALS — BP 108/64 | HR 89 | Ht 64.0 in | Wt 141.0 lb

## 2022-08-21 DIAGNOSIS — O3680X0 Pregnancy with inconclusive fetal viability, not applicable or unspecified: Secondary | ICD-10-CM

## 2022-08-21 DIAGNOSIS — Z3480 Encounter for supervision of other normal pregnancy, unspecified trimester: Secondary | ICD-10-CM

## 2022-08-21 DIAGNOSIS — Z3481 Encounter for supervision of other normal pregnancy, first trimester: Secondary | ICD-10-CM | POA: Diagnosis not present

## 2022-08-21 DIAGNOSIS — Z3A09 9 weeks gestation of pregnancy: Secondary | ICD-10-CM

## 2022-08-21 LAB — GC/CHLAMYDIA PROBE AMP (~~LOC~~) NOT AT ARMC
Chlamydia: NEGATIVE
Comment: NEGATIVE
Comment: NORMAL
Neisseria Gonorrhea: NEGATIVE

## 2022-08-21 MED ORDER — BLOOD PRESSURE KIT DEVI: 1.0000 | 0 refills | Status: DC

## 2022-08-21 MED ORDER — PRENATAL 28-0.8 MG PO TABS: 1.0000 | ORAL_TABLET | Freq: Every day | ORAL | 12 refills | Status: DC

## 2022-08-21 MED ORDER — PROMETHAZINE HCL 25 MG PO TABS: 25.0000 mg | ORAL_TABLET | Freq: Four times a day (QID) | ORAL | 1 refills | Status: DC | PRN

## 2022-08-21 NOTE — Progress Notes (Signed)
New OB Intake  I connected withNAME@ on 08/21/22 at 10:15 AM EST by Visit and verified that I am speaking with the correct person using two identifiers. Nurse is located at [facility] and pt is located at [place].  I discussed the limitations, risks, security and privacy concerns of performing an evaluation and management service by telephone and the availability of in person appointments. I also discussed with the patient that there may be a patient responsible charge related to this service. The patient expressed understanding and agreed to proceed.  I explained I am completing New OB Intake today. We discussed EDD of 03/17/23 that is based on LMP of 06/10/22. Pt is G4/P3. I reviewed her allergies, medications, Medical/Surgical/OB history, and appropriate screenings. I informed her of [facility] services. [facility] information placed in AVS. Based on history, this is a high risk pregnancy.  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   History of preterm delivery, currently pregnant in third trimester 10/22/2020   H/O fetal anomaly in prior pregnancy, currently pregnant, second trimester 10/22/2020   MDD (major depressive disorder), recurrent episode, moderate (HCC) 08/16/2013   PTSD (post-traumatic stress disorder) 08/15/2013   Oppositional defiant disorder 06/06/2012   Suicidal ideation 02/12/2012   Polysubstance abuse (HCC) 02/12/2012   ASTHMA 06/23/2010   GERD 06/23/2010    Concerns addressed today  Delivery Plans Plans to deliver at [facility]. Patient given information for [facility] website for more information [facility]. Patient is not interested in water birth. Offered upcoming OB visit with CNM to discuss further.  MyChart/Babyscripts MyChart access verified. I explained pt will have some visits in office and some virtually. Babyscripts instructions given and order placed. Patient verifies receipt of registration  text/e-mail. Account successfully created and app downloaded.  Blood Pressure Cuff/Weight Scale Blood pressure cuff ordered for patient to pick-up from [employer]. Explained after first prenatal appt pt will check weekly and document in Babyscripts. Patient does not have weight scale; patient may purchase if they desire to track weight weekly in Babyscripts.  Anatomy US Explained first scheduled [place] will be around 19 weeks. Anatomy US scheduled for 19wks at [facility]. Pt notified to arrive at [facility].  Labs Discussed Avelina Laine genetic screening with patient. Would like both Panorama and Horizon drawn at new OB visit. Routine prenatal labs needed.  COVID Vaccine Patient has not had COVID vaccine.   Social Determinants of Health Food Insecurity: Patient denies food insecurity. WIC Referral: Patient is interested in referral to [facility].  Transportation: Patient denies transportation needs. Childcare: Discussed no children allowed at ultrasound appointments. Offered childcare services; patient declines childcare services at this time.  First visit review I reviewed new OB appt with patient. I explained they will have a provider visit that includes labs. Explained pt will be seen by Dr. Ladon Applebaum at first visit; encounter routed to appropriate provider. Explained that patient will be seen by pregnancy navigator following visit with provider.   Harrel Lemon, RN 08/21/2022  10:10 AM

## 2022-09-07 NOTE — L&D Delivery Note (Signed)
Delivery Note Tracy Guzman is a 27 y.o. [phone] at [redacted]w[redacted]d admitted for early labor.   GBS Status: Negative/-- (06/13 1702) Maximum Maternal Temperature: 98.6  Labor course: Initial SVE: 3/50/-3 to 4.5/50/-3 in MAU. Augmentation with: AROM and Pitocin. She then progressed to complete.  ROM: 3h 44m with clear fluid  Birth: At 0501 a viable female was delivered via spontaneous vaginal delivery (Presentation: ROA). Nuchal cord present: Yes, delivered through and reduced immediately after birth.  Shoulders and body delivered in usual fashion. Infant placed directly on mom's abdomen for bonding/skin-to-skin, baby dried and stimulated. Cord clamped x 2 after 1 minute and cut by pt.  Cord blood collected.  The placenta separated spontaneously and delivered via gentle cord traction.  Pitocin infused rapidly IV per protocol.  Fundus firm with massage.  Placenta inspected and appears to be intact with a 3 VC, no evidence of abruption.  Placenta/Cord with the following complications: none .  Cord pH: not done Sponge and instrument count were correct x2.  Intrapartum complications:  increased bleeding clots, suspicious for partial marginal abruption Anesthesia:  epidural Episiotomy: none Lacerations:  none Suture Repair:  n/a EBL (mL): 118.00     Infant: APGAR (1 MIN): 9   APGAR (5 MINS): 9   APGAR (10 MINS):    Infant weight: pending  Delivery Report: Review the Delivery Report for details.    Mom to postpartum.  Baby to Couplet care / Skin to Skin. Placenta to L&D   Plans to Breastfeed Contraception: Salpingectomy, consent 12/30/22 Circumcision: N/A  Note sent to [facility]: MCW for pp visit.  Cheral Marker CNM, [facility] 03/10/2023 5:20 AM

## 2022-09-09 ENCOUNTER — Other Ambulatory Visit: Payer: Self-pay | Admitting: *Deleted

## 2022-09-09 ENCOUNTER — Telehealth: Payer: Self-pay | Admitting: Emergency Medicine

## 2022-09-09 DIAGNOSIS — Z3480 Encounter for supervision of other normal pregnancy, unspecified trimester: Secondary | ICD-10-CM

## 2022-09-09 MED ORDER — BLOOD PRESSURE MONITOR DEVI: 0 refills | Status: DC

## 2022-09-09 NOTE — Telephone Encounter (Signed)
Incoming call from pt stating she has had recurrent headaches and vision disturbances for the past 2 weeks. Has not had a BP cuff at home to assess BP readings.  BP cuff sent to pharmacy today, pt states that she is on the way to pick up cuff now. Instructed to take BP and submit to office via Mychart or to call. Instructed if elevated above 140/90 (either number) she needs to go to MAU to be evaluated.

## 2022-09-10 ENCOUNTER — Inpatient Hospital Stay (HOSPITAL_COMMUNITY)
Admission: AD | Admit: 2022-09-10 | Discharge: 2022-09-10 | Disposition: A | Discharge status: Home / Self Care | Payer: Medicaid Other | Attending: Family Medicine | Admitting: Family Medicine

## 2022-09-10 ENCOUNTER — Encounter (HOSPITAL_COMMUNITY): Payer: Self-pay | Admitting: Family Medicine

## 2022-09-10 DIAGNOSIS — R519 Headache, unspecified: Secondary | ICD-10-CM | POA: Insufficient documentation

## 2022-09-10 DIAGNOSIS — O26891 Other specified pregnancy related conditions, first trimester: Secondary | ICD-10-CM | POA: Diagnosis not present

## 2022-09-10 DIAGNOSIS — Z87891 Personal history of nicotine dependence: Secondary | ICD-10-CM | POA: Diagnosis not present

## 2022-09-10 DIAGNOSIS — Z3A13 13 weeks gestation of pregnancy: Secondary | ICD-10-CM | POA: Diagnosis not present

## 2022-09-10 LAB — URINALYSIS, ROUTINE W REFLEX MICROSCOPIC
Bilirubin Urine: NEGATIVE
Glucose, UA: NEGATIVE mg/dL
Hgb urine dipstick: NEGATIVE
Ketones, ur: NEGATIVE mg/dL
Nitrite: NEGATIVE
Protein, ur: NEGATIVE mg/dL
Specific Gravity, Urine: 1.005 (ref 1.005–1.030)
pH: 7 (ref 5.0–8.0)

## 2022-09-10 MED ORDER — KETOROLAC TROMETHAMINE 60 MG/2ML IM SOLN
30.0000 mg | Freq: Once | INTRAMUSCULAR | Status: AC
  Administered 2022-09-10: 30 mg via INTRAMUSCULAR
  Filled 2022-09-10: qty 2

## 2022-09-10 MED ORDER — KETOROLAC TROMETHAMINE 60 MG/2ML IM SOLN: 60.0000 mg | Freq: Once | INTRAMUSCULAR | Status: DC

## 2022-09-10 MED ORDER — BUTALBITAL-APAP-CAFFEINE 50-325-40 MG PO TABS: 1.0000 | ORAL_TABLET | Freq: Four times a day (QID) | ORAL | 0 refills | Status: DC | PRN

## 2022-09-10 NOTE — MAU Provider Note (Signed)
History     CSN: [id number]  Arrival date and time: 09/10/22 1319   Event Date/Time   First Provider Initiated Contact with Patient 09/10/22 1417      Chief Complaint  Patient presents with   Headache   HPI  Tracy Guzman is a 27 y.o. [id number] at [redacted]w[redacted]d who presents for evaluation of a headache. Patient reports the headache has been ongoing for 2 weeks. She reports she has a hx of headaches in pregnancy. She reports she used fioricet in the past but it makes her sleepy. Patient rates the pain as a 7/10 and has tried tylenol for the pain. She denies any vaginal bleeding, discharge, and leaking of fluid. Denies any constipation, diarrhea or any urinary complaints.    OB History     Gravida  4   Para  3   Term  2   Preterm  1   AB      Living  3      SAB      IAB      Ectopic      Multiple  0   Live Births  3           Past Medical History:  Diagnosis Date   Allergy    Smoke, dust, Pollen   Asthma    Asthma    Depression    Endometriosis    GERD (gastroesophageal reflux disease)     Past Surgical History:  Procedure Laterality Date   MYRINGOTOMY     TONSILLECTOMY AND ADENOIDECTOMY  as infant   WRIST SURGERY  age 33    Family History  Problem Relation Age of Onset   Cancer Maternal Grandmother    Depression Maternal Grandmother    Colon cancer Maternal Grandfather    Hypertension Father    Hyperlipidemia Father    Hypertension Mother    Depression Mother    Hyperlipidemia Mother    Drug abuse Maternal Uncle    Drug abuse Cousin     Social History   Tobacco Use   Smoking status: Former    Packs/day: 0.25    Years: 1.00    Total pack years: 0.25    Types: Cigarettes    Quit date: 08/14/2022    Years since quitting: 0.0   Smokeless tobacco: Never  Vaping Use   Vaping Use: Never used  Substance Use Topics   Alcohol use: Not Currently    Comment: not since confirmed pregnancy   Drug use: Not Currently    Types: Other-see  comments, MDMA (Ecstacy), Marijuana    Comment: last used end of December    Allergies:  Allergies  Allergen Reactions   Lamotrigine Rash    No medications prior to admission.    Review of Systems  Constitutional: Negative.  Negative for fatigue and fever.  HENT: Negative.    Respiratory: Negative.  Negative for shortness of breath.   Cardiovascular: Negative.  Negative for chest pain.  Gastrointestinal: Negative.  Negative for abdominal pain, constipation, diarrhea, nausea and vomiting.  Genitourinary: Negative.  Negative for dysuria, vaginal bleeding and vaginal discharge.  Neurological:  Positive for headaches. Negative for dizziness.   Physical Exam   Blood pressure (!) 106/50, pulse 72, temperature 98.4 F (36.9 C), temperature source Oral, resp. rate 17, height 5\' 4"  (1.626 m), weight 65.6 kg, last menstrual period 06/10/2022, SpO2 100 %, unknown if currently breastfeeding.  Patient Vitals for the past 24 hrs:  BP Temp Temp src Pulse Resp SpO2  Height Weight  09/10/22 1604 (!) 106/50 -- -- 72 17 100 % -- --  09/10/22 1358 105/71 98.4 F (36.9 C) Oral 77 18 100 % 5\' 4"  (1.626 m) 65.6 kg    Physical Exam Vitals and nursing note reviewed.  Constitutional:      General: She is not in acute distress.    Appearance: She is well-developed.  HENT:     Head: Normocephalic.  Eyes:     Pupils: Pupils are equal, round, and reactive to light.  Cardiovascular:     Rate and Rhythm: Normal rate and regular rhythm.     Heart sounds: Normal heart sounds.  Pulmonary:     Effort: Pulmonary effort is normal. No respiratory distress.     Breath sounds: Normal breath sounds.  Abdominal:     General: Bowel sounds are normal. There is no distension.     Palpations: Abdomen is soft.     Tenderness: There is no abdominal tenderness.  Skin:    General: Skin is warm and dry.  Neurological:     Mental Status: She is alert and oriented to person, place, and time.  Psychiatric:         Mood and Affect: Mood normal.        Behavior: Behavior normal.        Thought Content: Thought content normal.        Judgment: Judgment normal.     FHT: 148 bpm  MAU Course  Procedures  Results for orders placed or performed during the hospital encounter of 09/10/22 (from the past 24 hour(s))  Urinalysis, Routine w reflex microscopic Urine, Clean Catch     Status: Abnormal   Collection Time: 09/10/22  3:06 PM  Result Value Ref Range   Color, Urine STRAW (A) YELLOW   APPearance CLEAR CLEAR   Specific Gravity, Urine 1.005 1.005 - 1.030   pH 7.0 5.0 - 8.0   Glucose, UA NEGATIVE NEGATIVE mg/dL   Hgb urine dipstick NEGATIVE NEGATIVE   Bilirubin Urine NEGATIVE NEGATIVE   Ketones, ur NEGATIVE NEGATIVE mg/dL   Protein, ur NEGATIVE NEGATIVE mg/dL   Nitrite NEGATIVE NEGATIVE   Leukocytes,Ua SMALL (A) NEGATIVE   RBC / HPF 0-5 0 - 5 RBC/hpf   WBC, UA 6-10 0 - 5 WBC/hpf   Bacteria, UA RARE (A) NONE SEEN   Squamous Epithelial / HPF 0-5 0 - 5 /HPF      MDM Labs ordered and reviewed.   Toradol IM- patient reports resolution of HA  Assessment and Plan   1. Pregnancy headache in second trimester   2. [redacted] weeks gestation of pregnancy     -Discharge home in stable condition -Rx for fioricet refilled for patient -Second trimester precautions discussed -Patient advised to follow-up with OB as scheduled for prenatal care -Patient may return to MAU as needed or if her condition were to change or worsen  Wende Mott, CNM 09/10/2022, 2:17 PM

## 2022-09-10 NOTE — MAU Note (Signed)
Tracy Guzman is a 27 y.o. at [redacted]w[redacted]d here in MAU reporting: persistent HA for 2 wks, makes her eyes hurt, sometimes vision is blurred. Taking Tylenol, not touching it.  Has a hx of migraines.  Dr's office told her to come in .  Denies abd pain or vag bleeding.  Reports ? Chest pain the last couple days, during the winter her asthma flares up, doesn't know if it is that.  Onset of complaint: 2 wks ago Pain score: 8 Vitals:   09/10/22 1358  BP: 105/71  Pulse: 77  Resp: 18  Temp: 98.4 F (36.9 C)  SpO2: 100%     FHT:148 Lab orders placed from triage:  urine

## 2022-09-10 NOTE — Discharge Instructions (Signed)

## 2022-09-11 LAB — CULTURE, OB URINE

## 2022-09-15 ENCOUNTER — Other Ambulatory Visit: Payer: Self-pay | Admitting: Family Medicine

## 2022-09-15 ENCOUNTER — Ambulatory Visit: Payer: Medicaid Other | Admitting: Family Medicine

## 2022-09-15 ENCOUNTER — Encounter: Payer: Self-pay | Admitting: Family Medicine

## 2022-09-15 VITALS — BP 107/63 | HR 82 | Wt 147.0 lb

## 2022-09-15 DIAGNOSIS — F331 Major depressive disorder, recurrent, moderate: Secondary | ICD-10-CM | POA: Diagnosis not present

## 2022-09-15 DIAGNOSIS — O09292 Supervision of pregnancy with other poor reproductive or obstetric history, second trimester: Secondary | ICD-10-CM

## 2022-09-15 DIAGNOSIS — Z3481 Encounter for supervision of other normal pregnancy, first trimester: Secondary | ICD-10-CM

## 2022-09-15 DIAGNOSIS — Z3480 Encounter for supervision of other normal pregnancy, unspecified trimester: Secondary | ICD-10-CM

## 2022-09-15 DIAGNOSIS — O09291 Supervision of pregnancy with other poor reproductive or obstetric history, first trimester: Secondary | ICD-10-CM

## 2022-09-15 DIAGNOSIS — O09891 Supervision of other high risk pregnancies, first trimester: Secondary | ICD-10-CM | POA: Diagnosis not present

## 2022-09-15 DIAGNOSIS — Z2839 Other underimmunization status: Secondary | ICD-10-CM | POA: Insufficient documentation

## 2022-09-15 DIAGNOSIS — Z3A13 13 weeks gestation of pregnancy: Secondary | ICD-10-CM

## 2022-09-15 DIAGNOSIS — O09899 Supervision of other high risk pregnancies, unspecified trimester: Secondary | ICD-10-CM

## 2022-09-15 NOTE — Progress Notes (Signed)
History:   Tracy Guzman is a 27 y.o. [phone] at [redacted]w[redacted]d by LMP being seen today for her first obstetrical visit.  Her obstetrical history is significant for history of fetal anomaly and preterm delivery in a prior pregnancy. Patient does intend to breast feed. Pregnancy history fully reviewed.  Patient reports no complaints today. She is doing well overall.      HISTORY: OB History  Gravida Para Term Preterm AB Living  4 3 2 1  0 3  SAB IAB Ectopic Multiple Live Births  0 0 0 0 3    # Outcome Date GA Lbr Len/2nd Weight Sex Delivery Anes PTL Lv  4 Current           3 Term 05/04/21 [redacted]w[redacted]d 12:15 / 00:11 8 lb 9.7 oz (3.905 kg) M Vag-Spont EPI  LIV     Name: TRINI, SOLDO     Apgar1: 9  Apgar5: 9  2 Preterm 04/23/16 [redacted]w[redacted]d   M Vag-Spont   LIV  1 Term 06/01/15 [redacted]w[redacted]d   F Vag-Spont   LIV    Last pap smear was done 10/22/2020 and was normal  Past Medical History:  Diagnosis Date   Allergy    Smoke, dust, Pollen   Asthma    Asthma    Depression    Endometriosis    GERD (gastroesophageal reflux disease)    Polysubstance abuse (HCC) 02/12/2012   Suicidal ideation 02/12/2012   Past Surgical History:  Procedure Laterality Date   MYRINGOTOMY     TONSILLECTOMY AND ADENOIDECTOMY  as infant   WRIST SURGERY  age 59   Family History  Problem Relation Age of Onset   Cancer Maternal Grandmother    Depression Maternal Grandmother    Colon cancer Maternal Grandfather    Hypertension Father    Hyperlipidemia Father    Hypertension Mother    Depression Mother    Hyperlipidemia Mother    Drug abuse Maternal Uncle    Drug abuse Cousin    Social History   Tobacco Use   Smoking status: Former    Packs/day: 0.25    Years: 1.00    Total pack years: 0.25    Types: Cigarettes    Quit date: 08/14/2022    Years since quitting: 0.1   Smokeless tobacco: Never  Vaping Use   Vaping Use: Never used  Substance Use Topics   Alcohol use: Not Currently    Comment: not since  confirmed pregnancy   Drug use: Not Currently    Types: Other-see comments, MDMA (Ecstacy), Marijuana    Comment: last used end of December   Allergies  Allergen Reactions   Lamotrigine Rash   Current Outpatient Medications on File Prior to Visit  Medication Sig Dispense Refill   Blood Pressure Monitor DEVI Please check blood pressure 1-2 per week 1 each 0   butalbital-acetaminophen-caffeine (FIORICET) 50-325-40 MG tablet Take 1-2 tablets by mouth every 6 (six) hours as needed for headache. 20 tablet 0   Prenatal 28-0.8 MG TABS Take 1 tablet by mouth daily. 30 tablet 12   promethazine (PHENERGAN) 25 MG tablet Take 1 tablet (25 mg total) by mouth every 6 (six) hours as needed for nausea or vomiting. 30 tablet 1   albuterol (VENTOLIN HFA) 108 (90 Base) MCG/ACT inhaler Inhale 1-2 puffs into the lungs every 6 (six) hours as needed for wheezing or shortness of breath. (Patient not taking: Reported on 09/15/2022) 18 g 0   Blood Pressure Monitoring (BLOOD PRESSURE KIT) DEVI 1  Device by Does not apply route once a week. 1 each 0   [DISCONTINUED] mometasone-formoterol (DULERA) 100-5 MCG/ACT AERO Inhale 2 puffs into the lungs 2 (two) times daily.     No current facility-administered medications on file prior to visit.    Review of Systems Pertinent items noted in HPI and remainder of comprehensive ROS otherwise negative.  Indications for ASA therapy (per UpToDate) One of the following: Previous pregnancy with preeclampsia, especially early onset and with an adverse outcome No Multifetal gestation No Chronic hypertension No Type 1 or 2 diabetes mellitus No Chronic kidney disease No Autoimmune disease (antiphospholipid syndrome, systemic lupus erythematosus) No Two or more of the following: Nulliparity No Obesity (body mass index >30 kg/m2) No Family history of preeclampsia in mother or sister No Age ?35 years No Sociodemographic characteristics (African American race, low socioeconomic  level) No Personal risk factors (eg, previous pregnancy with low birth weight or small for gestational age infant, previous adverse pregnancy outcome [eg, stillbirth], interval >10 years between pregnancies) Yes   Physical Exam:   Vitals:   09/15/22 1314  BP: 107/63  Pulse: 82  Weight: 147 lb (66.7 kg)   Fetal Heart Rate (bpm): 147  Uterine size:    General: well-developed, well-nourished female in no acute distress  Breasts:  deferred  Skin: normal coloration and turgor, no rashes  Neurologic: oriented, normal, negative, normal mood  Extremities: normal strength, tone, and muscle mass, ROM of all joints is normal  HEENT PERRLA, extraocular movement intact and sclera clear, anicteric  Neck supple and no masses  Cardiovascular: regular rate and rhythm  Respiratory:  no respiratory distress, normal breath sounds  Abdomen: soft, non-tender; bowel sounds normal; no masses,  no organomegaly  Pelvic: Deferred.    Assessment:    Pregnancy: [id number] Patient Active Problem List   Diagnosis Date Noted   Rubella non-immune status, antepartum 09/15/2022   Supervision of other normal pregnancy, antepartum 08/21/2022   Bartholin's gland cyst 04/09/2021   H/O fetal anomaly in prior pregnancy, currently pregnant, second trimester 10/22/2020   MDD (major depressive disorder), recurrent episode, moderate (HCC) 08/16/2013   PTSD (post-traumatic stress disorder) 08/15/2013   Oppositional defiant disorder 06/06/2012   ASTHMA 06/23/2010   GERD 06/23/2010     Plan:    1. Supervision of other normal pregnancy, antepartum Doing well overall. No concerns today. Has gotten some of her labs recently. Other labs collected today.  2. History of preterm delivery, currently pregnant History of fetal anomaly Had a baby in 2017, her 2nd baby, with multiple fetal anomalies. Initially found out by ultrasound. Involved heart defects, vertebral anomalies, TEF. Has had multiple surgeries and has some  developmental delay, baby was born preterm because she had preterm labor.  1st and 3rd babies were fine and pregnancies uncomplicated.  History of depression, PTSD: has been stable. Off medications, not in therapy. Was mostly related to substance use during teenage years. No concerns in the last 10 years. No PPD with other deliveries.  Initial labs drawn. Continue prenatal vitamins. Problem list reviewed and updated. Genetic Screening discussed, Panorama and Horizon: ordered. Ultrasound discussed; fetal anatomic survey: scheduled. Anticipatory guidance about prenatal visits given including labs, ultrasounds, and testing.  Patient was encouraged to use MyChart to review results, send requests, and have questions addressed.    The nature of Center for [facility]/Faculty Practice with multiple MDs and Advanced Practice Providers was explained to patient; also emphasized that residents, students are part of our team. Routine obstetric  precautions reviewed. Encouraged to seek out care at our office or emergency room [facility] preferred) for urgent and/or emergent concerns.  Return in about 4 weeks (around 10/13/2022) for [facility].    Liliane Channel MD MPH OB Fellow, [place] for [place] 09/21/2022

## 2022-09-15 NOTE — Progress Notes (Signed)
Pt presents for NOB visit. GC/CT negative 08/20/22 Normal pap 10/22/20

## 2022-09-17 LAB — CBC/D/PLT+RPR+RH+ABO+RUBIGG...
Antibody Screen: NEGATIVE
Basophils Absolute: 0 10*3/uL (ref 0.0–0.2)
Basos: 1 %
EOS (ABSOLUTE): 0.2 10*3/uL (ref 0.0–0.4)
Eos: 4 %
HCV Ab: NONREACTIVE
HIV Screen 4th Generation wRfx: NONREACTIVE
Hematocrit: 33.5 % — ABNORMAL LOW (ref 34.0–46.6)
Hemoglobin: 11 g/dL — ABNORMAL LOW (ref 11.1–15.9)
Hepatitis B Surface Ag: NEGATIVE
Immature Grans (Abs): 0 10*3/uL (ref 0.0–0.1)
Immature Granulocytes: 0 %
Lymphocytes Absolute: 1.4 10*3/uL (ref 0.7–3.1)
Lymphs: 26 %
MCH: 25.6 pg — ABNORMAL LOW (ref 26.6–33.0)
MCHC: 32.8 g/dL (ref 31.5–35.7)
MCV: 78 fL — ABNORMAL LOW (ref 79–97)
Monocytes Absolute: 0.3 10*3/uL (ref 0.1–0.9)
Monocytes: 6 %
Neutrophils Absolute: 3.4 10*3/uL (ref 1.4–7.0)
Neutrophils: 63 %
Platelets: 199 10*3/uL (ref 150–450)
RBC: 4.3 x10E6/uL (ref 3.77–5.28)
RDW: 15.8 % — ABNORMAL HIGH (ref 11.7–15.4)
RPR Ser Ql: NONREACTIVE
Rh Factor: POSITIVE
Rubella Antibodies, IGG: <0.9 index — ABNORMAL LOW (ref >0.99)
WBC: 5.3 10*3/uL (ref 3.4–10.8)

## 2022-09-17 LAB — HCV INTERPRETATION

## 2022-09-17 LAB — URINE CULTURE, OB REFLEX

## 2022-09-17 LAB — CULTURE, OB URINE

## 2022-09-21 ENCOUNTER — Telehealth: Payer: Self-pay

## 2022-09-21 LAB — PANORAMA PRENATAL TEST FULL PANEL:PANORAMA TEST PLUS 5 ADDITIONAL MICRODELETIONS: FETAL FRACTION: 16

## 2022-09-21 NOTE — Telephone Encounter (Signed)
Attempted to contact about normal panorama results, no answer, left vm

## 2022-09-24 ENCOUNTER — Ambulatory Visit (HOSPITAL_COMMUNITY)
Admission: EM | Admit: 2022-09-24 | Discharge: 2022-09-24 | Disposition: A | Discharge status: Home / Self Care | Payer: Medicaid Other | Attending: Family Medicine | Admitting: Family Medicine

## 2022-09-24 ENCOUNTER — Other Ambulatory Visit: Payer: Self-pay

## 2022-09-24 ENCOUNTER — Encounter (HOSPITAL_COMMUNITY): Payer: Self-pay | Admitting: Emergency Medicine

## 2022-09-24 DIAGNOSIS — J4521 Mild intermittent asthma with (acute) exacerbation: Secondary | ICD-10-CM

## 2022-09-24 DIAGNOSIS — J0100 Acute maxillary sinusitis, unspecified: Secondary | ICD-10-CM | POA: Diagnosis not present

## 2022-09-24 MED ORDER — ALBUTEROL SULFATE HFA 108 (90 BASE) MCG/ACT IN AERS: 1.0000 | INHALATION_SPRAY | Freq: Four times a day (QID) | RESPIRATORY_TRACT | 0 refills | Status: DC | PRN

## 2022-09-24 MED ORDER — AMOXICILLIN 875 MG PO TABS: 875.0000 mg | ORAL_TABLET | Freq: Two times a day (BID) | ORAL | 0 refills | Status: AC

## 2022-09-24 NOTE — ED Provider Notes (Signed)
[name]   [id number] 09/24/22 Arrival Time: [place] PLAN:  1. Mild intermittent asthma with acute exacerbation   2. Acute non-recurrent maxillary sinusitis    No resp distress.  Meds ordered this encounter  Medications   albuterol (VENTOLIN HFA) 108 (90 Base) MCG/ACT inhaler    Sig: Inhale 1-2 puffs into the lungs every 6 (six) hours as needed for wheezing or shortness of breath.    Dispense:  1 each    Refill:  0   amoxicillin (AMOXIL) 875 MG tablet    Sig: Take 1 tablet (875 mg total) by mouth 2 (two) times daily for 10 days.    Dispense:  20 tablet    Refill:  0   Form completed for medication distribution where she lives. Discussed typical duration of symptoms. OTC symptom care as needed. Ensure adequate fluid intake and rest.   Follow-up Information     [facility] at [facility].   Specialty: Urgent Care Why: If worsening or failing to improve as anticipated. Contact information: [place] [id number] [phone]                Reviewed expectations re: course of current medical issues. Questions answered. Outlined signs and symptoms indicating need for more acute intervention. Patient verbalized understanding. After Visit Summary given.   SUBJECTIVE: History from: patient.  Tracy Guzman is a 27 y.o. female who presents with complaint of nasal congestion, post-nasal drainage, and sinus pain. Onset gradual,  1-2 w ago . Respiratory symptoms: mild dry cough. Sinus pressure bothering her the most. Fever: absent. Overall normal PO intake without n/v. OTC treatment: none. Seasonal allergies: no. History of frequent sinus infections: no. No specific aggravating or alleviating factors reported. [redacted] weeks pregnant . Social History   Tobacco Use  Smoking Status Former   Packs/day: 0.25   Years: 1.00   Total pack years: 0.25   Types: Cigarettes   Quit date: 08/14/2022   Years  since quitting: 0.1  Smokeless Tobacco Never   OBJECTIVE:  Vitals:   09/24/22 1216  BP: 109/65  Pulse: 78  Resp: 16  Temp: 98.5 F (36.9 C)  TempSrc: Oral  SpO2: 99%    General appearance: alert; no distress HEENT: nasal congestion; clear runny nose; throat irritation secondary to post-nasal drainage; bilateral maxillary tenderness to palpation; turbinates boggy Neck: supple without LAD; trachea midline Lungs: unlabored respirations, symmetrical air entry with mod exp wheezing present; cough: absent; no respiratory distress Skin: warm and dry Psychological: alert and cooperative; normal mood and affect  Allergies  Allergen Reactions   Lamotrigine Rash    Past Medical History:  Diagnosis Date   Allergy    Smoke, dust, Pollen   Asthma    Asthma    Depression    Endometriosis    GERD (gastroesophageal reflux disease)    Polysubstance abuse ([place]) 02/12/2012   Suicidal ideation 02/12/2012   Family History  Problem Relation Age of Onset   Cancer Maternal Grandmother    Depression Maternal Grandmother    Colon cancer Maternal Grandfather    Hypertension Father    Hyperlipidemia Father    Hypertension Mother    Depression Mother    Hyperlipidemia Mother    Drug abuse Maternal Uncle    Drug abuse Cousin    Social History   Socioeconomic History   Marital status: Single    Spouse name: Not on file   Number of children: Not on file  Years of education: Not on file   Highest education level: Not on file  Occupational History   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  Tobacco Use   Smoking status: Former    Packs/day: 0.25    Years: 1.00    Total pack years: 0.25    Types: Cigarettes    Quit date: 08/14/2022    Years since quitting: 0.1   Smokeless tobacco: Never  Vaping Use   Vaping Use: Never used  Substance and Sexual Activity   Alcohol use: Not Currently    Comment: not since confirmed pregnancy   Drug use: Not  Currently    Types: Other-see comments, MDMA (Ecstacy), Marijuana    Comment: last used end of December   Sexual activity: Yes    Partners: Male    Birth control/protection: None  Other Topics Concern   Not on file  Social History Narrative   ** Merged History Encounter **       Social Determinants of Health   Financial Resource Strain: Not on file  Food Insecurity: Not on file  Transportation Needs: Not on file  Physical Activity: Not on file  Stress: Not on file  Social Connections: Not on file  Intimate Partner Violence: Not on file             [place], MD 09/24/22 1705

## 2022-09-24 NOTE — ED Triage Notes (Signed)
Complains orf runny nose, cough congestion and sob, headache, and mucus is bright green.  Symptoms started 1 1/2 weeks ago.  At that time, phlegm was clear, in last 3-4 days has changed color.    Patient has had tylenol  Patient is a client at room at the end and any medications have to have a form completed for staff to administer any drug.  Patient is [redacted] weeks pregnant

## 2022-10-13 ENCOUNTER — Encounter: Payer: Medicaid Other | Admitting: Obstetrics & Gynecology

## 2022-10-13 ENCOUNTER — Ambulatory Visit (INDEPENDENT_AMBULATORY_CARE_PROVIDER_SITE_OTHER): Payer: Medicaid Other | Admitting: Obstetrics and Gynecology

## 2022-10-13 VITALS — BP 114/68 | HR 106 | Wt 152.0 lb

## 2022-10-13 DIAGNOSIS — O09892 Supervision of other high risk pregnancies, second trimester: Secondary | ICD-10-CM

## 2022-10-13 DIAGNOSIS — Z2839 Other underimmunization status: Secondary | ICD-10-CM

## 2022-10-13 DIAGNOSIS — F4310 Post-traumatic stress disorder, unspecified: Secondary | ICD-10-CM

## 2022-10-13 DIAGNOSIS — J45909 Unspecified asthma, uncomplicated: Secondary | ICD-10-CM

## 2022-10-13 DIAGNOSIS — Z3A17 17 weeks gestation of pregnancy: Secondary | ICD-10-CM

## 2022-10-13 DIAGNOSIS — F331 Major depressive disorder, recurrent, moderate: Secondary | ICD-10-CM

## 2022-10-13 DIAGNOSIS — O09292 Supervision of pregnancy with other poor reproductive or obstetric history, second trimester: Secondary | ICD-10-CM

## 2022-10-13 DIAGNOSIS — Z3480 Encounter for supervision of other normal pregnancy, unspecified trimester: Secondary | ICD-10-CM

## 2022-10-13 MED ORDER — FLUTICASONE PROPIONATE HFA 44 MCG/ACT IN AERO: 2.0000 | INHALATION_SPRAY | Freq: Two times a day (BID) | RESPIRATORY_TRACT | 12 refills | Status: DC

## 2022-10-13 NOTE — Progress Notes (Signed)
   PRENATAL VISIT NOTE  Subjective:  Tracy Guzman is a 27 y.o. [phone] at [redacted]w[redacted]d being seen today for ongoing prenatal care.  She is currently monitored for the following issues for this low-risk pregnancy and has Asthma; GERD; Oppositional defiant disorder; PTSD (post-traumatic stress disorder); MDD (major depressive disorder), recurrent episode, moderate ([place]); H/O fetal anomaly in prior pregnancy, currently pregnant, second trimester; Supervision of other normal pregnancy, antepartum; and Rubella non-immune status, antepartum on their problem list.  Patient reports no complaints.  Reports more asthma exacerbations in the winter, has been using a resume inhaler >2 times per week. Contractions: Irritability. Vag. Bleeding: None.   . Denies leaking of fluid.   The following portions of the patient's history were reviewed and updated as appropriate: allergies, current medications, past family history, past medical history, past social history, past surgical history and problem list.   Objective:   Vitals:   10/13/22 1406  BP: 114/68  Pulse: (!) 106  Weight: 152 lb (68.9 kg)   Fetal Status: Fetal Heart Rate (bpm): 150         General:  Alert, oriented and cooperative. Patient is in no acute distress.  Skin: Skin is warm and dry. No rash noted.   Cardiovascular: Normal heart rate noted  Respiratory: Normal respiratory effort, no problems with respiration noted  Abdomen: Soft, gravid, appropriate for gestational age.  Pain/Pressure: Absent      Assessment and Plan:  Pregnancy: [id number] at [redacted]w[redacted]d 1. Supervision of other normal pregnancy, antepartum 2. [redacted] weeks gestation of pregnancy Anatomy US scheduled 2/14 Counseled on AFP - pt accepting, ordered - AFP, Serum, Open Spina Bifida  3. Asthma Inadequately controlled. ADD low dose ICS  -     fluticasone (FLOVENT HFA) 44 MCG/ACT inhaler; Inhale 2 puffs into the lungs 2 (two) times daily.  4. Rubella non-immune status,  antepartum Reviewed PP MMR  5. H/O fetal anomaly in prior pregnancy, currently pregnant, second trimester AFP ordered, anatomy US 2/14  6. MDD (major depressive disorder), recurrent episode, moderate ([place]) 7. PTSD (post-traumatic stress disorder) No mood complaints today  General obstetric precautions including but not limited to vaginal bleeding, contractions, leaking of fluid and fetal movement were reviewed in detail with the patient.  Please refer to After Visit Summary for other counseling recommendations.   Return in about 4 weeks (around 11/10/2022) for return OB.  Future Appointments  Date Time Provider [place]  10/21/2022 10:15 AM [facility] NURSE [facility]  10/21/2022 10:30 AM [facility] US3 [facility]  11/10/2022  8:55 AM Constant, Vickii Chafe, MD [facility] None   Inez Catalina, MD

## 2022-10-16 LAB — AFP, SERUM, OPEN SPINA BIFIDA
AFP MoM: 1.37
AFP Value: 59 ng/mL
Gest. Age on Collection Date: 17.9 weeks
Maternal Age At EDD: 26.2 yr
OSBR Risk 1 IN: 3920
Test Results:: NEGATIVE
Weight: 152 [lb_av]

## 2022-10-21 ENCOUNTER — Ambulatory Visit: Payer: Medicaid Other | Attending: Obstetrics and Gynecology

## 2022-10-21 ENCOUNTER — Other Ambulatory Visit: Payer: Self-pay | Admitting: *Deleted

## 2022-10-21 ENCOUNTER — Encounter: Payer: Self-pay | Admitting: *Deleted

## 2022-10-21 ENCOUNTER — Ambulatory Visit: Payer: Medicaid Other | Admitting: *Deleted

## 2022-10-21 VITALS — BP 113/66 | HR 85

## 2022-10-21 DIAGNOSIS — O09292 Supervision of pregnancy with other poor reproductive or obstetric history, second trimester: Secondary | ICD-10-CM

## 2022-10-21 DIAGNOSIS — Z3689 Encounter for other specified antenatal screening: Secondary | ICD-10-CM | POA: Insufficient documentation

## 2022-10-21 DIAGNOSIS — Z3480 Encounter for supervision of other normal pregnancy, unspecified trimester: Secondary | ICD-10-CM | POA: Diagnosis present

## 2022-10-21 DIAGNOSIS — Z3A18 18 weeks gestation of pregnancy: Secondary | ICD-10-CM | POA: Insufficient documentation

## 2022-10-21 DIAGNOSIS — Z363 Encounter for antenatal screening for malformations: Secondary | ICD-10-CM | POA: Diagnosis not present

## 2022-10-21 DIAGNOSIS — O99322 Drug use complicating pregnancy, second trimester: Secondary | ICD-10-CM | POA: Diagnosis not present

## 2022-11-10 ENCOUNTER — Encounter: Payer: Medicaid Other | Admitting: Obstetrics and Gynecology

## 2022-11-10 ENCOUNTER — Ambulatory Visit (INDEPENDENT_AMBULATORY_CARE_PROVIDER_SITE_OTHER): Payer: Medicaid Other | Admitting: Obstetrics and Gynecology

## 2022-11-10 ENCOUNTER — Other Ambulatory Visit (HOSPITAL_COMMUNITY)
Admission: RE | Admit: 2022-11-10 | Discharge: 2022-11-10 | Disposition: A | Discharge status: Home / Self Care | Payer: Medicaid Other | Source: Ambulatory Visit | Attending: Obstetrics and Gynecology | Admitting: Obstetrics and Gynecology

## 2022-11-10 VITALS — BP 110/68 | HR 72 | Wt 156.3 lb

## 2022-11-10 DIAGNOSIS — Z2839 Other underimmunization status: Secondary | ICD-10-CM

## 2022-11-10 DIAGNOSIS — O26892 Other specified pregnancy related conditions, second trimester: Secondary | ICD-10-CM

## 2022-11-10 DIAGNOSIS — O09892 Supervision of other high risk pregnancies, second trimester: Secondary | ICD-10-CM

## 2022-11-10 DIAGNOSIS — N898 Other specified noninflammatory disorders of vagina: Secondary | ICD-10-CM | POA: Insufficient documentation

## 2022-11-10 DIAGNOSIS — Z3480 Encounter for supervision of other normal pregnancy, unspecified trimester: Secondary | ICD-10-CM

## 2022-11-10 DIAGNOSIS — O09292 Supervision of pregnancy with other poor reproductive or obstetric history, second trimester: Secondary | ICD-10-CM

## 2022-11-10 DIAGNOSIS — Z3A21 21 weeks gestation of pregnancy: Secondary | ICD-10-CM

## 2022-11-10 NOTE — Progress Notes (Signed)
   PRENATAL VISIT NOTE  Subjective:  Tracy Guzman is a 27 y.o. [phone] at 44w6dbeing seen today for ongoing prenatal care.  She is currently monitored for the following issues for this high-risk pregnancy and has Asthma; GERD; Oppositional defiant disorder; PTSD (post-traumatic stress disorder); MDD (major depressive disorder), recurrent episode, moderate ([place]; H/O fetal anomaly in prior pregnancy, currently pregnant, second trimester; Supervision of other normal pregnancy, antepartum; and Rubella non-immune status, antepartum on their problem list.  Patient doing well with no acute concerns today. She reports no complaints.  Contractions: Not present. Vag. Bleeding: None.  Movement: Present. Denies leaking of fluid.   The following portions of the patient's history were reviewed and updated as appropriate: allergies, current medications, past family history, past medical history, past social history, past surgical history and problem list. Problem list updated.  Objective:   Vitals:   11/10/22 1026  BP: 110/68  Pulse: 72  Weight: 156 lb 4.8 oz (70.9 kg)    Fetal Status: Fetal Heart Rate (bpm): 147 Fundal Height: 21 cm Movement: Present     General:  Alert, oriented and cooperative. Patient is in no acute distress.  Skin: Skin is warm and dry. No rash noted.   Cardiovascular: Normal heart rate noted  Respiratory: Normal respiratory effort, no problems with respiration noted  Abdomen: Soft, gravid, appropriate for gestational age.  Pain/Pressure: Absent     Pelvic: Cervical exam deferred        Extremities: Normal range of motion.  Edema: None  Mental Status:  Normal mood and affect. Normal behavior. Normal judgment and thought content.   Assessment and Plan:  Pregnancy: [id number] 232w6d1. [redacted] weeks gestation of pregnancy   2. Rubella non-immune status, antepartum Treat after delivery  3. Supervision of other normal pregnancy, antepartum Continue routine prenatal  care  4. Vaginal discharge during pregnancy in second trimester Pt has performed self swab, will treat pending results - Cervicovaginal ancillary only( [place] 5. H/O fetal anomaly in prior pregnancy, currently pregnant, second trimester Pt has fetal echo 12/09/22  Preterm labor symptoms and general obstetric precautions including but not limited to vaginal bleeding, contractions, leaking of fluid and fetal movement were reviewed in detail with the patient.  Please refer to After Visit Summary for other counseling recommendations.   Return in about 4 weeks (around 12/08/2022) [facility]   LaLynnda ShieldsMD Faculty Attending Center [facility]

## 2022-11-11 LAB — CERVICOVAGINAL ANCILLARY ONLY
Bacterial Vaginitis (gardnerella): POSITIVE — AB
Candida Glabrata: NEGATIVE
Candida Vaginitis: NEGATIVE
Comment: NEGATIVE
Comment: NEGATIVE
Comment: NEGATIVE

## 2022-11-12 ENCOUNTER — Telehealth: Payer: Self-pay | Admitting: Lactation Services

## 2022-11-12 MED ORDER — METRONIDAZOLE 500 MG PO TABS: 500.0000 mg | ORAL_TABLET | Freq: Two times a day (BID) | ORAL | 0 refills | Status: DC

## 2022-11-12 NOTE — Telephone Encounter (Addendum)
Called patient to discuss treatment, she has read message form Dr. Elgie Congo in My Chart. LM that a prescription has been sent to her Pharmacy and to call the office with any questions or concerns.     ----- Message from Griffin Basil, MD sent at 11/11/2022  1:45 PM EST ----- Vaginal swab positive for BV, will offer treatment

## 2022-11-25 ENCOUNTER — Ambulatory Visit: Payer: Medicaid Other | Admitting: *Deleted

## 2022-11-25 ENCOUNTER — Other Ambulatory Visit: Payer: Self-pay | Admitting: *Deleted

## 2022-11-25 ENCOUNTER — Ambulatory Visit: Payer: Medicaid Other | Attending: Obstetrics

## 2022-11-25 DIAGNOSIS — Z8279 Family history of other congenital malformations, deformations and chromosomal abnormalities: Secondary | ICD-10-CM

## 2022-11-25 DIAGNOSIS — O09292 Supervision of pregnancy with other poor reproductive or obstetric history, second trimester: Secondary | ICD-10-CM | POA: Insufficient documentation

## 2022-11-25 DIAGNOSIS — Z3A23 23 weeks gestation of pregnancy: Secondary | ICD-10-CM

## 2022-11-25 DIAGNOSIS — O09299 Supervision of pregnancy with other poor reproductive or obstetric history, unspecified trimester: Secondary | ICD-10-CM

## 2022-12-08 ENCOUNTER — Ambulatory Visit (INDEPENDENT_AMBULATORY_CARE_PROVIDER_SITE_OTHER): Payer: Medicaid Other | Admitting: Family Medicine

## 2022-12-08 ENCOUNTER — Other Ambulatory Visit (HOSPITAL_COMMUNITY)
Admission: RE | Admit: 2022-12-08 | Discharge: 2022-12-08 | Disposition: A | Discharge status: Home / Self Care | Payer: Medicaid Other | Source: Ambulatory Visit | Attending: Family Medicine | Admitting: Family Medicine

## 2022-12-08 ENCOUNTER — Other Ambulatory Visit: Payer: Self-pay

## 2022-12-08 VITALS — BP 96/60 | HR 99 | Wt 162.2 lb

## 2022-12-08 DIAGNOSIS — Z3480 Encounter for supervision of other normal pregnancy, unspecified trimester: Secondary | ICD-10-CM

## 2022-12-08 DIAGNOSIS — O09292 Supervision of pregnancy with other poor reproductive or obstetric history, second trimester: Secondary | ICD-10-CM

## 2022-12-08 DIAGNOSIS — O26893 Other specified pregnancy related conditions, third trimester: Secondary | ICD-10-CM | POA: Insufficient documentation

## 2022-12-08 DIAGNOSIS — O09892 Supervision of other high risk pregnancies, second trimester: Secondary | ICD-10-CM

## 2022-12-08 DIAGNOSIS — O26892 Other specified pregnancy related conditions, second trimester: Secondary | ICD-10-CM

## 2022-12-08 DIAGNOSIS — Z3009 Encounter for other general counseling and advice on contraception: Secondary | ICD-10-CM | POA: Insufficient documentation

## 2022-12-08 DIAGNOSIS — N898 Other specified noninflammatory disorders of vagina: Secondary | ICD-10-CM | POA: Diagnosis present

## 2022-12-08 DIAGNOSIS — Z2839 Other underimmunization status: Secondary | ICD-10-CM

## 2022-12-08 DIAGNOSIS — Z3A25 25 weeks gestation of pregnancy: Secondary | ICD-10-CM

## 2022-12-08 NOTE — Patient Instructions (Signed)

## 2022-12-08 NOTE — Addendum Note (Signed)
Addended by: Forrestine Him A on: 12/08/2022 04:39 PM   Modules accepted: Orders

## 2022-12-08 NOTE — Progress Notes (Signed)
   Subjective:  Tracy Guzman is a 27 y.o. [phone] at [redacted]w[redacted]d being seen today for ongoing prenatal care.  She is currently monitored for the following issues for this low-risk pregnancy and has Asthma; GERD; Oppositional defiant disorder; PTSD (post-traumatic stress disorder); MDD (major depressive disorder), recurrent episode, moderate ([place]); H/O fetal anomaly in prior pregnancy, currently pregnant, second trimester; Supervision of other normal pregnancy, antepartum; and Rubella non-immune status, antepartum on their problem list.  Patient reports no complaints.  Contractions: Not present. Vag. Bleeding: None.  Movement: Present. Denies leaking of fluid.   The following portions of the patient's history were reviewed and updated as appropriate: allergies, current medications, past family history, past medical history, past social history, past surgical history and problem list. Problem list updated.  Objective:   Vitals:   12/08/22 1616  BP: 96/60  Pulse: 99  Weight: 162 lb 3.2 oz (73.6 kg)    Fetal Status: Fetal Heart Rate (bpm): 141   Movement: Present     General:  Alert, oriented and cooperative. Patient is in no acute distress.  Skin: Skin is warm and dry. No rash noted.   Cardiovascular: Normal heart rate noted  Respiratory: Normal respiratory effort, no problems with respiration noted  Abdomen: Soft, gravid, appropriate for gestational age. Pain/Pressure: Absent     Pelvic: Vag. Bleeding: None Vag D/C Character: Watery (smelly per patient)   Cervical exam deferred        Extremities: Normal range of motion.  Edema: Trace  Mental Status: Normal mood and affect. Normal behavior. Normal judgment and thought content.   Urinalysis:      Assessment and Plan:  Pregnancy: [id number] at [redacted]w[redacted]d  1. Supervision of other normal pregnancy, antepartum BP and FHR normal Advised of fasting labs for next visit Some discharge, self swab today  2. Rubella non-immune status,  antepartum Offer MMR PP  3. H/O fetal anomaly in prior pregnancy, currently pregnant, second trimester First preg with multiple anomalies but doing well now Has fetal echo scheduled for 01/05/2023  4. Unwanted fertility Wants BTL after delivery Sign papers next visit  Preterm labor symptoms and general obstetric precautions including but not limited to vaginal bleeding, contractions, leaking of fluid and fetal movement were reviewed in detail with the patient. Please refer to After Visit Summary for other counseling recommendations.  Return in 2 weeks (on 12/22/2022) for [facility], ob visit.   Clarnce Flock, MD

## 2022-12-09 LAB — CERVICOVAGINAL ANCILLARY ONLY
Bacterial Vaginitis (gardnerella): NEGATIVE
Candida Glabrata: NEGATIVE
Candida Vaginitis: NEGATIVE
Chlamydia: NEGATIVE
Comment: NEGATIVE
Comment: NEGATIVE
Comment: NEGATIVE
Comment: NEGATIVE
Comment: NEGATIVE
Comment: NORMAL
Neisseria Gonorrhea: NEGATIVE
Trichomonas: NEGATIVE

## 2022-12-25 ENCOUNTER — Other Ambulatory Visit: Payer: Self-pay

## 2022-12-25 DIAGNOSIS — Z3480 Encounter for supervision of other normal pregnancy, unspecified trimester: Secondary | ICD-10-CM

## 2022-12-29 NOTE — Progress Notes (Unsigned)
   PRENATAL VISIT NOTE  Subjective:  Tracy Guzman is a 27 y.o. [phone] at [redacted]w[redacted]d being seen today for ongoing prenatal care.  She is currently monitored for the following issues for this high-risk pregnancy and has Asthma; GERD; Oppositional defiant disorder; PTSD (post-traumatic stress disorder); MDD (major depressive disorder), recurrent episode, moderate (HCC); H/O fetal anomaly in prior pregnancy, currently pregnant, second trimester; Supervision of other normal pregnancy, antepartum; Rubella non-immune status, antepartum; and Unwanted fertility on their problem list.  Patient reports {sx:[id number]}.   .  .   . Denies leaking of fluid.   The following portions of the patient's history were reviewed and updated as appropriate: allergies, current medications, past family history, past medical history, past social history, past surgical history and problem list.   Objective:  There were no vitals filed for this visit.  Fetal Status:           General:  Alert, oriented and cooperative. Patient is in no acute distress.  Skin: Skin is warm and dry. No rash noted.   Cardiovascular: Normal heart rate noted  Respiratory: Normal respiratory effort, no problems with respiration noted  Abdomen: Soft, gravid, appropriate for gestational age.        Pelvic: {Blank single:19197::"Cervical exam performed in the presence of a chaperone","Cervical exam deferred"}        Extremities: Normal range of motion.     Mental Status: Normal mood and affect. Normal behavior. Normal judgment and thought content.   Assessment and Plan:  Pregnancy: [id number] at [redacted]w[redacted]d 1. H/O fetal anomaly in prior pregnancy, currently pregnant, second trimester - Nml anatomy  2. Supervision of other normal pregnancy, antepartum - Routine care - 28 week labs - ***TDaP  3. Rubella non-immune status, antepartum - MMR PP  4. Unwanted fertility - BTS consent***  5. [redacted] weeks gestation of pregnancy   Preterm labor symptoms  and general obstetric precautions including but not limited to vaginal bleeding, contractions, leaking of fluid and fetal movement were reviewed in detail with the patient. Please refer to After Visit Summary for other counseling recommendations.   No follow-ups on file.  Future Appointments  Date Time Provider Department Center  12/30/2022  8:20 AM [facility] LAB [facility]  12/30/2022  8:55 AM [place] Appl [facility]  01/27/2023  3:30 PM [facility] NURSE [facility]  01/27/2023  3:45 PM [facility] US4 [facility]    Dorathy Kinsman, CNM

## 2022-12-29 NOTE — Patient Instructions (Signed)

## 2022-12-30 ENCOUNTER — Other Ambulatory Visit: Payer: Self-pay

## 2022-12-30 ENCOUNTER — Other Ambulatory Visit: Payer: Medicaid Other

## 2022-12-30 ENCOUNTER — Ambulatory Visit (INDEPENDENT_AMBULATORY_CARE_PROVIDER_SITE_OTHER): Payer: Medicaid Other | Admitting: Advanced Practice Midwife

## 2022-12-30 ENCOUNTER — Other Ambulatory Visit (HOSPITAL_COMMUNITY)
Admission: RE | Admit: 2022-12-30 | Discharge: 2022-12-30 | Disposition: A | Discharge status: Home / Self Care | Payer: Medicaid Other | Source: Ambulatory Visit | Attending: Obstetrics & Gynecology | Admitting: Obstetrics & Gynecology

## 2022-12-30 VITALS — BP 114/66 | HR 99 | Wt 166.9 lb

## 2022-12-30 DIAGNOSIS — Z2839 Other underimmunization status: Secondary | ICD-10-CM

## 2022-12-30 DIAGNOSIS — B9689 Other specified bacterial agents as the cause of diseases classified elsewhere: Secondary | ICD-10-CM

## 2022-12-30 DIAGNOSIS — Z3009 Encounter for other general counseling and advice on contraception: Secondary | ICD-10-CM

## 2022-12-30 DIAGNOSIS — Z3A29 29 weeks gestation of pregnancy: Secondary | ICD-10-CM

## 2022-12-30 DIAGNOSIS — O26893 Other specified pregnancy related conditions, third trimester: Secondary | ICD-10-CM | POA: Diagnosis present

## 2022-12-30 DIAGNOSIS — O09893 Supervision of other high risk pregnancies, third trimester: Secondary | ICD-10-CM

## 2022-12-30 DIAGNOSIS — O09292 Supervision of pregnancy with other poor reproductive or obstetric history, second trimester: Secondary | ICD-10-CM

## 2022-12-30 DIAGNOSIS — N898 Other specified noninflammatory disorders of vagina: Secondary | ICD-10-CM | POA: Diagnosis present

## 2022-12-30 DIAGNOSIS — N760 Acute vaginitis: Secondary | ICD-10-CM

## 2022-12-30 DIAGNOSIS — O09293 Supervision of pregnancy with other poor reproductive or obstetric history, third trimester: Secondary | ICD-10-CM

## 2022-12-30 DIAGNOSIS — Z3480 Encounter for supervision of other normal pregnancy, unspecified trimester: Secondary | ICD-10-CM

## 2022-12-31 LAB — CERVICOVAGINAL ANCILLARY ONLY
Bacterial Vaginitis (gardnerella): POSITIVE — AB
Candida Glabrata: NEGATIVE
Candida Vaginitis: NEGATIVE
Comment: NEGATIVE
Comment: NEGATIVE
Comment: NEGATIVE

## 2022-12-31 LAB — CBC
Hematocrit: 31.3 % — ABNORMAL LOW (ref 34.0–46.6)
Hemoglobin: 10.1 g/dL — ABNORMAL LOW (ref 11.1–15.9)
MCH: 26.3 pg — ABNORMAL LOW (ref 26.6–33.0)
MCHC: 32.3 g/dL (ref 31.5–35.7)
MCV: 82 fL (ref 79–97)
Platelets: 157 10*3/uL (ref 150–450)
RBC: 3.84 x10E6/uL (ref 3.77–5.28)
RDW: 13.2 % (ref 11.7–15.4)
WBC: 7.2 10*3/uL (ref 3.4–10.8)

## 2022-12-31 LAB — GLUCOSE TOLERANCE, 2 HOURS W/ 1HR
Glucose, 1 hour: 116 mg/dL (ref 70–179)
Glucose, 2 hour: 102 mg/dL (ref 70–152)
Glucose, Fasting: 72 mg/dL (ref 70–91)

## 2022-12-31 LAB — HIV ANTIBODY (ROUTINE TESTING W REFLEX): HIV Screen 4th Generation wRfx: NONREACTIVE

## 2022-12-31 LAB — RPR: RPR Ser Ql: NONREACTIVE

## 2023-01-04 MED ORDER — METRONIDAZOLE 500 MG PO TABS: 500.0000 mg | ORAL_TABLET | Freq: Two times a day (BID) | ORAL | 0 refills | Status: DC

## 2023-01-04 NOTE — Addendum Note (Signed)
Addended by: Dorathy Kinsman on: 01/04/2023 11:16 PM   Modules accepted: Orders

## 2023-01-11 ENCOUNTER — Inpatient Hospital Stay (HOSPITAL_COMMUNITY)
Admission: AD | Admit: 2023-01-11 | Discharge: 2023-01-11 | Disposition: A | Discharge status: Home / Self Care | Payer: Medicaid Other | Attending: Obstetrics and Gynecology | Admitting: Obstetrics and Gynecology

## 2023-01-11 ENCOUNTER — Encounter (HOSPITAL_COMMUNITY): Payer: Self-pay | Admitting: Obstetrics and Gynecology

## 2023-01-11 ENCOUNTER — Inpatient Hospital Stay (HOSPITAL_BASED_OUTPATIENT_CLINIC_OR_DEPARTMENT_OTHER): Payer: Medicaid Other

## 2023-01-11 DIAGNOSIS — Z3A30 30 weeks gestation of pregnancy: Secondary | ICD-10-CM | POA: Diagnosis not present

## 2023-01-11 DIAGNOSIS — R102 Pelvic and perineal pain: Secondary | ICD-10-CM

## 2023-01-11 DIAGNOSIS — O26893 Other specified pregnancy related conditions, third trimester: Secondary | ICD-10-CM | POA: Insufficient documentation

## 2023-01-11 DIAGNOSIS — W010XXA Fall on same level from slipping, tripping and stumbling without subsequent striking against object, initial encounter: Secondary | ICD-10-CM | POA: Insufficient documentation

## 2023-01-11 DIAGNOSIS — Y9289 Other specified places as the place of occurrence of the external cause: Secondary | ICD-10-CM | POA: Insufficient documentation

## 2023-01-11 DIAGNOSIS — O9A213 Injury, poisoning and certain other consequences of external causes complicating pregnancy, third trimester: Secondary | ICD-10-CM

## 2023-01-11 DIAGNOSIS — R0781 Pleurodynia: Secondary | ICD-10-CM | POA: Insufficient documentation

## 2023-01-11 DIAGNOSIS — O9A219 Injury, poisoning and certain other consequences of external causes complicating pregnancy, unspecified trimester: Secondary | ICD-10-CM | POA: Insufficient documentation

## 2023-01-11 DIAGNOSIS — Y990 Civilian activity done for income or pay: Secondary | ICD-10-CM | POA: Insufficient documentation

## 2023-01-11 DIAGNOSIS — W19XXXA Unspecified fall, initial encounter: Secondary | ICD-10-CM | POA: Diagnosis not present

## 2023-01-11 DIAGNOSIS — M7918 Myalgia, other site: Secondary | ICD-10-CM

## 2023-01-11 DIAGNOSIS — W1849XA Other slipping, tripping and stumbling without falling, initial encounter: Secondary | ICD-10-CM | POA: Diagnosis not present

## 2023-01-11 MED ORDER — CYCLOBENZAPRINE HCL 10 MG PO TABS: 10.0000 mg | ORAL_TABLET | Freq: Two times a day (BID) | ORAL | 0 refills | Status: DC | PRN

## 2023-01-11 MED ORDER — CYCLOBENZAPRINE HCL 5 MG PO TABS
10.0000 mg | ORAL_TABLET | Freq: Once | ORAL | Status: AC
  Administered 2023-01-11: 10 mg via ORAL
  Filled 2023-01-11: qty 2

## 2023-01-11 NOTE — MAU Note (Signed)
Tracy Guzman is a 27 y.o. at [redacted]w[redacted]d here in MAU reporting: slipped and fell at work.  Hit a metal sink below her rt breast.  Is having pain at impact site and mid chest. Hurts to take a deep breath.  Feeling some cramping in her abd. No bleeding or LOF.  Has not felt baby move since fall, "this is her sleepy time'. Onset of complaint: 1430 Pain score: 7 Vitals:   01/11/23 1625  BP: 109/64  Pulse: (!) 101  Resp: 17  Temp: 97.9 F (36.6 C)  SpO2: 100%     FHT:133 Lab orders placed from triage:    Started moving in triage

## 2023-01-11 NOTE — Discharge Instructions (Signed)

## 2023-01-11 NOTE — MAU Provider Note (Signed)
History     CSN: [id number]  Arrival date and time: 01/11/23 1558   Event Date/Time   First Provider Initiated Contact with Patient 01/11/23 1653      Chief Complaint  Patient presents with   Fall   Abdominal Pain   Chest Pain   HPI  Tracy Guzman is a 27 y.o. [id number] at [redacted]w[redacted]d who presents for evaluation of rib pain. Patient reports she was at work and slipped at 1430. She hit her left ribs on a metal sink. She did not fall. She did not hit her abdomen. She reports her ribs are very sore now. Patient rates the pain as a 7/10 and has not tried anything for the pain. She denies any vaginal bleeding, discharge, and leaking of fluid. Denies any constipation, diarrhea or any urinary complaints. Reports normal fetal movement.   OB History     Gravida  4   Para  3   Term  2   Preterm  1   AB      Living  3      SAB      IAB      Ectopic      Multiple  0   Live Births  3           Past Medical History:  Diagnosis Date   Allergy    Smoke, dust, Pollen   Asthma    Asthma    Depression    Endometriosis    GERD (gastroesophageal reflux disease)    Polysubstance abuse (HCC) 02/12/2012   Suicidal ideation 02/12/2012    Past Surgical History:  Procedure Laterality Date   MYRINGOTOMY     TONSILLECTOMY AND ADENOIDECTOMY  as infant   WRIST SURGERY  age 76    Family History  Problem Relation Age of Onset   Cancer Maternal Grandmother    Depression Maternal Grandmother    Colon cancer Maternal Grandfather    Hypertension Father    Hyperlipidemia Father    Hypertension Mother    Depression Mother    Hyperlipidemia Mother    Drug abuse Maternal Uncle    Drug abuse Cousin     Social History   Tobacco Use   Smoking status: Former    Packs/day: 0.25    Years: 1.00    Additional pack years: 0.00    Total pack years: 0.25    Types: Cigarettes    Quit date: 08/14/2022    Years since quitting: 0.4   Smokeless tobacco: Never  Vaping Use    Vaping Use: Never used  Substance Use Topics   Alcohol use: Not Currently    Comment: not since confirmed pregnancy   Drug use: Not Currently    Types: Other-see comments, MDMA (Ecstacy), Marijuana    Comment: last used end of December    Allergies:  Allergies  Allergen Reactions   Lamotrigine Rash    No medications prior to admission.    Review of Systems  Constitutional: Negative.  Negative for fatigue and fever.  HENT: Negative.    Respiratory: Negative.  Negative for shortness of breath.   Cardiovascular: Negative.  Negative for chest pain.  Gastrointestinal: Negative.  Negative for abdominal pain, constipation, diarrhea, nausea and vomiting.  Genitourinary: Negative.  Negative for dysuria, vaginal bleeding and vaginal discharge.  Musculoskeletal:        Rib pain  Neurological: Negative.  Negative for dizziness and headaches.   Physical Exam   Blood pressure 108/63, pulse 95, temperature 97.9  F (36.6 C), temperature source Oral, resp. rate 17, height 5\' 4"  (1.626 m), weight 78.6 kg, last menstrual period 06/10/2022, SpO2 100 %, unknown if currently breastfeeding.  Patient Vitals for the past 24 hrs:  BP Temp Temp src Pulse Resp SpO2 Height Weight  01/11/23 1827 108/63 -- -- 95 17 -- -- --  01/11/23 1647 110/65 -- -- 92 -- -- -- --  01/11/23 1625 109/64 97.9 F (36.6 C) Oral (!) 101 17 100 % 5\' 4"  (1.626 m) 78.6 kg    Physical Exam Vitals and nursing note reviewed.  Constitutional:      General: She is not in acute distress.    Appearance: She is well-developed.  HENT:     Head: Normocephalic.  Eyes:     Pupils: Pupils are equal, round, and reactive to light.  Cardiovascular:     Rate and Rhythm: Normal rate and regular rhythm.     Heart sounds: Normal heart sounds.  Pulmonary:     Effort: Pulmonary effort is normal. No respiratory distress.     Breath sounds: Normal breath sounds.  Abdominal:     General: Bowel sounds are normal. There is no distension.      Palpations: Abdomen is soft.     Tenderness: There is no abdominal tenderness.    Skin:    General: Skin is warm and dry.  Neurological:     Mental Status: She is alert and oriented to person, place, and time.  Psychiatric:        Mood and Affect: Mood normal.        Behavior: Behavior normal.        Thought Content: Thought content normal.        Judgment: Judgment normal.     Fetal Tracing:  Baseline: 120 Variability: moderate Accels: 15x15 Decels: none  Toco: none  MAU Course  Procedures       MDM Labs ordered and reviewed.   NST reactive. Now 4hrs past fall. [place] MFM OB limited Flexeril- patient reports improvement of pain.  Assessment and Plan   1. Musculoskeletal pain   2. Fall, initial encounter   3. [redacted] weeks gestation of pregnancy    -Discharge home in stable condition -Rx for flexeril sent to pharmacy -Third trimester precautions discussed -Patient advised to follow-up with OB as scheduled for prenatal care -Patient may return to MAU as needed or if her condition were to change or worsen  Rolm Bookbinder, CNM 01/11/2023, 4:53 PM

## 2023-01-13 ENCOUNTER — Ambulatory Visit (INDEPENDENT_AMBULATORY_CARE_PROVIDER_SITE_OTHER): Payer: Medicaid Other | Admitting: Obstetrics and Gynecology

## 2023-01-13 ENCOUNTER — Encounter: Payer: Self-pay | Admitting: Obstetrics and Gynecology

## 2023-01-13 ENCOUNTER — Other Ambulatory Visit: Payer: Self-pay

## 2023-01-13 VITALS — BP 108/62 | HR 101 | Wt 173.2 lb

## 2023-01-13 DIAGNOSIS — Z23 Encounter for immunization: Secondary | ICD-10-CM

## 2023-01-13 DIAGNOSIS — Z3009 Encounter for other general counseling and advice on contraception: Secondary | ICD-10-CM

## 2023-01-13 DIAGNOSIS — Z2839 Supervision of other high risk pregnancies, unspecified trimester: Secondary | ICD-10-CM

## 2023-01-13 DIAGNOSIS — O09293 Supervision of pregnancy with other poor reproductive or obstetric history, third trimester: Secondary | ICD-10-CM

## 2023-01-13 DIAGNOSIS — Z3480 Encounter for supervision of other normal pregnancy, unspecified trimester: Secondary | ICD-10-CM

## 2023-01-13 DIAGNOSIS — J45909 Unspecified asthma, uncomplicated: Secondary | ICD-10-CM

## 2023-01-13 DIAGNOSIS — O09292 Supervision of pregnancy with other poor reproductive or obstetric history, second trimester: Secondary | ICD-10-CM

## 2023-01-13 DIAGNOSIS — Z3A31 31 weeks gestation of pregnancy: Secondary | ICD-10-CM

## 2023-01-13 DIAGNOSIS — O09893 Supervision of other high risk pregnancies, third trimester: Secondary | ICD-10-CM

## 2023-01-13 NOTE — Progress Notes (Signed)
Subjective:  Denessa Humphres is a 27 y.o. [phone] at [redacted]w[redacted]d being seen today for ongoing prenatal care.  She is currently monitored for the following issues for this low-risk pregnancy and has Asthma; GERD; Oppositional defiant disorder; PTSD (post-traumatic stress disorder); MDD (major depressive disorder), recurrent episode, moderate (HCC); H/O fetal anomaly in prior pregnancy, currently pregnant, second trimester; Supervision of other normal pregnancy, antepartum; Rubella non-immune status, antepartum; and Unwanted fertility on their problem list.  Patient reports no complaints.  Contractions: Irritability. Vag. Bleeding: None.  Movement: Present. Denies leaking of fluid.   The following portions of the patient's history were reviewed and updated as appropriate: allergies, current medications, past family history, past medical history, past social history, past surgical history and problem list. Problem list updated.  Objective:   Vitals:   01/13/23 1522  BP: 108/62  Pulse: (!) 101  Weight: 173 lb 3.2 oz (78.6 kg)    Fetal Status: Fetal Heart Rate (bpm): 154   Movement: Present     General:  Alert, oriented and cooperative. Patient is in no acute distress.  Skin: Skin is warm and dry. No rash noted.   Cardiovascular: Normal heart rate noted  Respiratory: Normal respiratory effort, no problems with respiration noted  Abdomen: Soft, gravid, appropriate for gestational age. Pain/Pressure: Present     Pelvic:  Cervical exam deferred        Extremities: Normal range of motion.  Edema: Trace  Mental Status: Normal mood and affect. Normal behavior. Normal judgment and thought content.   Urinalysis:      Assessment and Plan:  Pregnancy: [id number] at [redacted]w[redacted]d  1. Supervision of other normal pregnancy, antepartum Stable - Tdap vaccine greater than or equal to 7yo IM  2. Unwanted fertility BTL papers signed  3. Rubella non-immune status, antepartum Vaccine PP  4. H/O fetal anomaly in  prior pregnancy, currently pregnant, second trimester Normal anatomy scan  5. Uncomplicated asthma, unspecified asthma severity, unspecified whether persistent Stable Continue with current management  Preterm labor symptoms and general obstetric precautions including but not limited to vaginal bleeding, contractions, leaking of fluid and fetal movement were reviewed in detail with the patient. Please refer to After Visit Summary for other counseling recommendations.  Return in about 2 weeks (around 01/27/2023) for OB visit, face to face, any provider.   Hermina Staggers, MD

## 2023-01-27 ENCOUNTER — Ambulatory Visit: Payer: Medicaid Other | Attending: Obstetrics and Gynecology

## 2023-01-27 ENCOUNTER — Ambulatory Visit: Payer: Medicaid Other | Admitting: *Deleted

## 2023-01-27 VITALS — BP 126/66 | HR 105

## 2023-01-27 DIAGNOSIS — O09299 Supervision of pregnancy with other poor reproductive or obstetric history, unspecified trimester: Secondary | ICD-10-CM | POA: Diagnosis present

## 2023-01-27 DIAGNOSIS — O09213 Supervision of pregnancy with history of pre-term labor, third trimester: Secondary | ICD-10-CM

## 2023-01-27 DIAGNOSIS — O09899 Supervision of other high risk pregnancies, unspecified trimester: Secondary | ICD-10-CM | POA: Insufficient documentation

## 2023-01-27 DIAGNOSIS — Z3A32 32 weeks gestation of pregnancy: Secondary | ICD-10-CM | POA: Diagnosis not present

## 2023-02-03 ENCOUNTER — Encounter: Payer: Medicaid Other | Admitting: Obstetrics and Gynecology

## 2023-02-04 ENCOUNTER — Ambulatory Visit (INDEPENDENT_AMBULATORY_CARE_PROVIDER_SITE_OTHER): Payer: Medicaid Other | Admitting: Obstetrics and Gynecology

## 2023-02-04 ENCOUNTER — Other Ambulatory Visit: Payer: Self-pay

## 2023-02-04 ENCOUNTER — Encounter: Payer: Self-pay | Admitting: Obstetrics and Gynecology

## 2023-02-04 VITALS — BP 106/69 | HR 90 | Wt 174.5 lb

## 2023-02-04 DIAGNOSIS — Z3009 Encounter for other general counseling and advice on contraception: Secondary | ICD-10-CM

## 2023-02-04 DIAGNOSIS — O09293 Supervision of pregnancy with other poor reproductive or obstetric history, third trimester: Secondary | ICD-10-CM

## 2023-02-04 DIAGNOSIS — Z3A34 34 weeks gestation of pregnancy: Secondary | ICD-10-CM

## 2023-02-04 DIAGNOSIS — J45909 Unspecified asthma, uncomplicated: Secondary | ICD-10-CM

## 2023-02-04 DIAGNOSIS — O09292 Supervision of pregnancy with other poor reproductive or obstetric history, second trimester: Secondary | ICD-10-CM

## 2023-02-05 NOTE — Progress Notes (Signed)
   PRENATAL VISIT NOTE  Subjective:  Tracy Guzman is a 27 y.o. [phone] at [redacted]w[redacted]d being seen today for ongoing prenatal care.  She is currently monitored for the following issues for this high-risk pregnancy and has Asthma; GERD; Oppositional defiant disorder; PTSD (post-traumatic stress disorder); MDD (major depressive disorder), recurrent episode, moderate (HCC); H/O fetal anomaly in prior pregnancy, currently pregnant, second trimester; Supervision of other normal pregnancy, antepartum; Rubella non-immune status, antepartum; and Unwanted fertility on their problem list.  Patient reports no complaints.  Contractions: Irritability. Vag. Bleeding: None.  Movement: Present. Denies leaking of fluid.   The following portions of the patient's history were reviewed and updated as appropriate: allergies, current medications, past family history, past medical history, past social history, past surgical history and problem list.   Objective:   Vitals:   02/04/23 1618  BP: 106/69  Pulse: 90  Weight: 174 lb 8 oz (79.2 kg)    Fetal Status: Fetal Heart Rate (bpm): 155   Movement: Present     General:  Alert, oriented and cooperative. Patient is in no acute distress.  Skin: Skin is warm and dry. No rash noted.   Cardiovascular: Normal heart rate noted  Respiratory: Normal respiratory effort, no problems with respiration noted  Abdomen: Soft, gravid, appropriate for gestational age.  Pain/Pressure: Absent     Pelvic: Cervical exam deferred        Extremities: Normal range of motion.  Edema: Trace  Mental Status: Normal mood and affect. Normal behavior. Normal judgment and thought content.   Assessment and Plan:  Pregnancy: [id number] at [redacted]w[redacted]d 1. [redacted] weeks gestation of pregnancy Desires elective IOL due to child care reasons. D/w her re: scheduling process and can do next visit GBS next visit  2. Uncomplicated asthma, unspecified asthma severity, unspecified whether persistent Continues on  flovent bid and does not need to use albuterol PRN  3. H/O fetal anomaly in prior pregnancy, currently pregnant, second trimester Normal anatomy and fetal echo this pregnancy. Repeat growth u/s PRN 5/22: efw 49%, 2066gm, ac 46%, afi 20  4. Unwanted fertility Papers signed on 4/29  Preterm labor symptoms and general obstetric precautions including but not limited to vaginal bleeding, contractions, leaking of fluid and fetal movement were reviewed in detail with the patient. Please refer to After Visit Summary for other counseling recommendations.   Return in about 2 weeks (around 02/18/2023) for in person, md or app, low risk ob.  Future Appointments  Date Time Provider Department Center  02/18/2023  3:15 PM Sw[place] Bing, MD [facility]    [place] Bing, MD

## 2023-02-07 ENCOUNTER — Inpatient Hospital Stay (HOSPITAL_COMMUNITY)
Admission: AD | Admit: 2023-02-07 | Discharge: 2023-02-07 | Disposition: A | Discharge status: Home / Self Care | Payer: Medicaid Other | Attending: Obstetrics & Gynecology | Admitting: Obstetrics & Gynecology

## 2023-02-07 ENCOUNTER — Encounter (HOSPITAL_COMMUNITY): Payer: Self-pay | Admitting: Obstetrics & Gynecology

## 2023-02-07 DIAGNOSIS — Z3A34 34 weeks gestation of pregnancy: Secondary | ICD-10-CM | POA: Diagnosis not present

## 2023-02-07 DIAGNOSIS — O368120 Decreased fetal movements, second trimester, not applicable or unspecified: Secondary | ICD-10-CM | POA: Diagnosis not present

## 2023-02-07 DIAGNOSIS — B9689 Other specified bacterial agents as the cause of diseases classified elsewhere: Secondary | ICD-10-CM | POA: Diagnosis not present

## 2023-02-07 DIAGNOSIS — O23592 Infection of other part of genital tract in pregnancy, second trimester: Secondary | ICD-10-CM | POA: Insufficient documentation

## 2023-02-07 DIAGNOSIS — N760 Acute vaginitis: Secondary | ICD-10-CM | POA: Diagnosis not present

## 2023-02-07 DIAGNOSIS — O479 False labor, unspecified: Secondary | ICD-10-CM

## 2023-02-07 DIAGNOSIS — R109 Unspecified abdominal pain: Secondary | ICD-10-CM | POA: Diagnosis not present

## 2023-02-07 DIAGNOSIS — O26892 Other specified pregnancy related conditions, second trimester: Secondary | ICD-10-CM | POA: Diagnosis present

## 2023-02-07 DIAGNOSIS — O09893 Supervision of other high risk pregnancies, third trimester: Secondary | ICD-10-CM | POA: Diagnosis not present

## 2023-02-07 DIAGNOSIS — O4703 False labor before 37 completed weeks of gestation, third trimester: Secondary | ICD-10-CM | POA: Insufficient documentation

## 2023-02-07 DIAGNOSIS — O219 Vomiting of pregnancy, unspecified: Secondary | ICD-10-CM | POA: Insufficient documentation

## 2023-02-07 DIAGNOSIS — O368131 Decreased fetal movements, third trimester, fetus 1: Secondary | ICD-10-CM | POA: Diagnosis not present

## 2023-02-07 DIAGNOSIS — O09292 Supervision of pregnancy with other poor reproductive or obstetric history, second trimester: Secondary | ICD-10-CM | POA: Diagnosis not present

## 2023-02-07 LAB — URINALYSIS, ROUTINE W REFLEX MICROSCOPIC
Bilirubin Urine: NEGATIVE
Glucose, UA: NEGATIVE mg/dL
Hgb urine dipstick: NEGATIVE
Ketones, ur: NEGATIVE mg/dL
Nitrite: NEGATIVE
Protein, ur: NEGATIVE mg/dL
Specific Gravity, Urine: 1.012 (ref 1.005–1.030)
pH: 7 (ref 5.0–8.0)

## 2023-02-07 LAB — WET PREP, GENITAL
Sperm: NONE SEEN
Trich, Wet Prep: NONE SEEN
WBC, Wet Prep HPF POC: >=10 — AB (ref <10)
Yeast Wet Prep HPF POC: NONE SEEN

## 2023-02-07 MED ORDER — METRONIDAZOLE 500 MG PO TABS: 500.0000 mg | ORAL_TABLET | Freq: Two times a day (BID) | ORAL | 0 refills | Status: DC

## 2023-02-07 MED ORDER — ONDANSETRON 4 MG PO TBDP
8.0000 mg | ORAL_TABLET | Freq: Once | ORAL | Status: AC
  Administered 2023-02-07: 8 mg via ORAL
  Filled 2023-02-07: qty 2

## 2023-02-07 MED ORDER — ONDANSETRON 4 MG PO TBDP: 4.0000 mg | ORAL_TABLET | Freq: Three times a day (TID) | ORAL | 1 refills | Status: DC | PRN

## 2023-02-07 NOTE — MAU Note (Signed)
Tracy Guzman is a 27 y.o. at [redacted]w[redacted]d here in MAU reporting: only felt about 3-4 movements since 7 last night.  Has been feeling really crampy.  Hasn't been feeling well.  Slept most of yesterday. Denies fever, diarrhea or vomiting. Has been nauseated, unable to eat much the last 4 days. No bleeding or leaking  Onset of complaint: last night Pain score: 6 Vitals:   02/07/23 0942  BP: 108/68  Pulse: (!) 111  Resp: 18  Temp: 98 F (36.7 C)  SpO2: 98%     FHT:138 Lab orders placed from triage:  urine

## 2023-02-07 NOTE — MAU Provider Note (Signed)
Chief Complaint  Patient presents with   Abdominal Pain   Decreased Fetal Movement     Event Date/Time   First Provider Initiated Contact with Patient 02/07/23 1016      S: Tracy Guzman  is a 27 y.o. y.o. year old G29P2103 female at [redacted]w[redacted]d weeks gestation who presents to MAU reporting decreased fetal movement since last night and increased cramping x 2 days.  Has not been feeling well the last few days.  Decreased appetite.  Nausea, no vomiting.  Denies fever or chills.  No sick contacts.  Tried 1 dose of Phenergan at nighttime.  Could not tell if it helped.  Contractions: Irregular, mild-moderate Vaginal bleeding: Denies Leaking of fluid: Denies  Patient Active Problem List   Diagnosis Date Noted   Unwanted fertility 12/08/2022   Rubella non-immune status, antepartum 09/15/2022   Supervision of other normal pregnancy, antepartum 08/21/2022   H/O fetal anomaly in prior pregnancy, currently pregnant, second trimester 10/22/2020   MDD (major depressive disorder), recurrent episode, moderate (HCC) 08/16/2013   PTSD (post-traumatic stress disorder) 08/15/2013   Oppositional defiant disorder 06/06/2012   Asthma 06/23/2010   GERD 06/23/2010   OB History  Gravida Para Term Preterm AB Living  4 3 2 1  0 3  SAB IAB Ectopic Multiple Live Births  0 0 0 0 3    # Outcome Date GA Lbr Len/2nd Weight Sex Delivery Anes PTL Lv  4 Current           3 Term 05/04/21 [redacted]w[redacted]d 12:15 / 00:11 3905 g M Vag-Spont EPI  LIV     Name: Tracy Guzman     Apgar1: 9  Apgar5: 9  2 Preterm 04/23/16 [redacted]w[redacted]d   M Vag-Spont   LIV  1 Term 06/01/15 [redacted]w[redacted]d   F Vag-Spont   LIV     O: Patient Vitals for the past 24 hrs:  BP Temp Temp src Pulse Resp SpO2 Height Weight  02/07/23 1159 101/65 -- -- 84 -- -- -- --  02/07/23 0955 112/69 -- -- (!) 109 -- 100 % -- --  02/07/23 0942 108/68 98 F (36.7 C) Oral (!) 111 18 98 % 5\' 4"  (1.626 m) 79.2 kg   General: NAD Heart: Mild tachycardia, resolved Lungs:  Normal rate and effort Abd: Soft, NT, Gravid, S=D Pelvic: NEFG, no blood.  Dilation: Closed Effacement (%): Thick Exam by:: Ivonne Andrew CNM  NST performed EFM: 135, mod variability, 15x15 accels, no decels Toco: Rare, mild  MAU course Orders Placed This Encounter  Procedures   Wet prep, genital    Standing Status:   Standing    Number of Occurrences:   1   Urinalysis, Routine w reflex microscopic -Urine, Clean Catch    Standing Status:   Standing    Number of Occurrences:   1    Order Specific Question:   Specimen Source    Answer:   Urine, Clean Catch [76]   Discharge patient    Order Specific Question:   Discharge disposition    Answer:   01-Home or Self Care [1]    Order Specific Question:   Discharge patient date    Answer:   02/07/2023   Meds ordered this encounter  Medications   ondansetron (ZOFRAN-ODT) disintegrating tablet 8 mg   ondansetron (ZOFRAN-ODT) 4 MG disintegrating tablet    Sig: Take 1-2 tablets (4-8 mg total) by mouth every 8 (eight) hours as needed for nausea or vomiting.    Dispense:  30 tablet  Refill:  1    Order Specific Question:   Supervising Provider    Answer:   Duane Lope H [[id number]]   metroNIDAZOLE (FLAGYL) 500 MG tablet    Sig: Take 1 tablet (500 mg total) by mouth 2 (two) times daily.    Dispense:  14 tablet    Refill:  0    Order Specific Question:   Supervising Provider    Answer:   Duane Lope H [[id number]]   Fetus active and fetal heart rate reactive after p.o. hydration.  Patient reports feeling normal fetal movement.  Feeling much better after Zofran.  Tolerating p.o.'s.   MDM -[redacted] weeks gestation with decreased fetal movement likely due to poor p.o. intake.  Fetal movement return to normal with p.o. hydration after Zofran.  Encourage patient to use Zofran as needed to be able to eat and drink normally.  -Nausea of unknown etiology.  Possible mild gastroenteritis.  Symptoms are controlled with Zofran.  - Cramping due to [employer]  contractions and bacterial vaginosis.  Cervix closed and thick.  Rx Flagyl for BV.  no evidence of active preterm labor.  Of note, patient has history of spontaneous preterm labor and delivery in a pregnancy where she had severe polyhydramnios and an anomalous fetus.  A: [redacted]w[redacted]d week IUP Decreased fetal movement resolved with reactive NST. 1. Decreased fetal movement, third trimester, fetus 1   2. Braxton Hicks contractions   3. History of preterm delivery, currently pregnant in third trimester   4. Bacterial vaginosis   5. Nausea and vomiting in pregnancy      P: Discharge home in stable condition. Preterm labor precautions and fetal kick counts. Follow-up as scheduled for prenatal visit or sooner as needed if symptoms worsen. Return to maternity admissions as needed if symptoms worsen.  Follow-up Information     [facility] Follow up.   Specialty: Obstetrics and Gynecology Why: As scheduled or sooner as needed if symptoms worsen Contact information: [address] [place] [address] [phone]        [facility] Follow up.   Specialty: Obstetrics and Gynecology Why: As needed in emergencies, As needed if symptoms worsen Contact information: [address] [phone] m[place] [address] [phone]               Allergies as of 02/07/2023       Reactions   Lamotrigine Rash        Medication List     TAKE these medications    albuterol 108 (90 Base) MCG/ACT inhaler Commonly known as: VENTOLIN HFA Inhale 1-2 puffs into the lungs every 6 (six) hours as needed for wheezing or shortness of breath.   Blood Pressure Kit Devi 1 Device by Does not apply route once a week.   Blood Pressure Monitor Devi Please check blood pressure 1-2 per week   cyclobenzaprine 10 MG tablet Commonly known as: FLEXERIL Take 1 tablet (10 mg total) by mouth 2  (two) times daily as needed for muscle spasms.   fluticasone 44 MCG/ACT inhaler Commonly known as: Flovent HFA Inhale 2 puffs into the lungs 2 (two) times daily.   metroNIDAZOLE 500 MG tablet Commonly known as: Flagyl Take 1 tablet (500 mg total) by mouth 2 (two) times daily.   ondansetron 4 MG disintegrating tablet Commonly known as: ZOFRAN-ODT Take 1-2 tablets (4-8 mg total) by mouth every 8 (eight) hours as needed for nausea  or vomiting.   Prenatal 28-0.8 MG Tabs Take 1 tablet by mouth daily.          Katrinka Blazing, [place], CNM 02/07/2023 11:41 AM  2

## 2023-02-08 ENCOUNTER — Encounter: Payer: Self-pay | Admitting: Obstetrics and Gynecology

## 2023-02-08 LAB — GC/CHLAMYDIA PROBE AMP (~~LOC~~) NOT AT ARMC
Chlamydia: NEGATIVE
Comment: NEGATIVE
Comment: NORMAL
Neisseria Gonorrhea: NEGATIVE

## 2023-02-09 ENCOUNTER — Other Ambulatory Visit: Payer: Self-pay | Admitting: Obstetrics and Gynecology

## 2023-02-09 ENCOUNTER — Other Ambulatory Visit: Payer: Self-pay | Admitting: *Deleted

## 2023-02-09 ENCOUNTER — Encounter: Payer: Self-pay | Admitting: Obstetrics and Gynecology

## 2023-02-09 MED ORDER — ALBUTEROL SULFATE HFA 108 (90 BASE) MCG/ACT IN AERS: 1.0000 | INHALATION_SPRAY | Freq: Four times a day (QID) | RESPIRATORY_TRACT | 0 refills | Status: DC | PRN

## 2023-02-12 ENCOUNTER — Encounter: Payer: Self-pay | Admitting: Family Medicine

## 2023-02-18 ENCOUNTER — Other Ambulatory Visit: Payer: Self-pay

## 2023-02-18 ENCOUNTER — Encounter: Payer: Self-pay | Admitting: Obstetrics and Gynecology

## 2023-02-18 ENCOUNTER — Ambulatory Visit (INDEPENDENT_AMBULATORY_CARE_PROVIDER_SITE_OTHER): Payer: Medicaid Other | Admitting: Obstetrics and Gynecology

## 2023-02-18 VITALS — BP 115/65 | HR 114 | Wt 182.2 lb

## 2023-02-18 DIAGNOSIS — Z3A36 36 weeks gestation of pregnancy: Secondary | ICD-10-CM | POA: Diagnosis not present

## 2023-02-18 DIAGNOSIS — Z3483 Encounter for supervision of other normal pregnancy, third trimester: Secondary | ICD-10-CM | POA: Diagnosis not present

## 2023-02-18 DIAGNOSIS — Z3480 Encounter for supervision of other normal pregnancy, unspecified trimester: Secondary | ICD-10-CM

## 2023-02-18 DIAGNOSIS — R600 Localized edema: Secondary | ICD-10-CM

## 2023-02-18 NOTE — Progress Notes (Signed)
   PRENATAL VISIT NOTE  Subjective:  Tracy Guzman is a 27 y.o. [phone] at [redacted]w[redacted]d being seen today for ongoing prenatal care.  She is currently monitored for the following issues for this high-risk pregnancy and has Asthma; GERD; PTSD (post-traumatic stress disorder); MDD (major depressive disorder), recurrent episode, moderate (HCC); H/O fetal anomaly in prior pregnancy, currently pregnant, second trimester; Supervision of other normal pregnancy, antepartum; Rubella non-immune status, antepartum; and Unwanted fertility on their problem list.  Patient reports  b/l LE edema .  Contractions: Irritability. Vag. Bleeding: None.  Movement: Present. Denies leaking of fluid.   The following portions of the patient's history were reviewed and updated as appropriate: allergies, current medications, past family history, past medical history, past social history, past surgical history and problem list.   Objective:   Vitals:   02/18/23 1600  BP: 115/65  Pulse: (!) 114  Weight: 182 lb 3.2 oz (82.6 kg)    Fetal Status: Fetal Heart Rate (bpm): 143 Fundal Height: 36 cm Movement: Present  Presentation: Vertex  General:  Alert, oriented and cooperative. Patient is in no acute distress.  Skin: Skin is warm and dry. No rash noted.   Cardiovascular: Normal heart rate noted  Respiratory: Normal respiratory effort, no problems with respiration noted  Abdomen: Soft, gravid, appropriate for gestational age.  Pain/Pressure: Present     Pelvic: Cervical exam performed in the presence of a chaperone Dilation: Fingertip Effacement (%): Thick Station: Ballotable  Extremities: Normal range of motion.  Edema: Mild pitting, slight indentation 2+ symmetric bilaterally LEs  Mental Status: Normal mood and affect. Normal behavior. Normal judgment and thought content.   Assessment and Plan:  Pregnancy: [id number] at [redacted]w[redacted]d 1. Supervision of other normal pregnancy, antepartum BTL paper signed 4/29 Patient put on  elective IOL list for 7/3; process explained to patient - Culture, beta strep (group b only)  2. Bilateral lower extremity edema Continue compression stockings, other strategies d/w her.   Preterm labor symptoms and general obstetric precautions including but not limited to vaginal bleeding, contractions, leaking of fluid and fetal movement were reviewed in detail with the patient. Please refer to After Visit Summary for other counseling recommendations.   Return in about 1 week (around 02/25/2023) for 7-10d, low risk ob, md or app, in person or virtual.  Future Appointments  Date Time Provider Department Center  03/01/2023  8:15 AM [place] Bing, MD [facility]  03/08/2023  9:35 AM Reva Bores, MD [facility]  03/15/2023 10:15 AM Axis Bing, MD [facility]    [place] Bing, MD

## 2023-02-22 LAB — CULTURE, BETA STREP (GROUP B ONLY): Strep Gp B Culture: NEGATIVE

## 2023-02-23 ENCOUNTER — Inpatient Hospital Stay (HOSPITAL_COMMUNITY)
Admission: AD | Admit: 2023-02-23 | Discharge: 2023-02-23 | Disposition: A | Discharge status: Home / Self Care | Payer: Medicaid Other | Attending: Obstetrics and Gynecology | Admitting: Obstetrics and Gynecology

## 2023-02-23 ENCOUNTER — Encounter (HOSPITAL_COMMUNITY): Payer: Self-pay | Admitting: Obstetrics and Gynecology

## 2023-02-23 DIAGNOSIS — Z3A36 36 weeks gestation of pregnancy: Secondary | ICD-10-CM | POA: Insufficient documentation

## 2023-02-23 DIAGNOSIS — O479 False labor, unspecified: Secondary | ICD-10-CM | POA: Diagnosis not present

## 2023-02-23 DIAGNOSIS — O4703 False labor before 37 completed weeks of gestation, third trimester: Secondary | ICD-10-CM | POA: Diagnosis present

## 2023-02-23 DIAGNOSIS — G44209 Tension-type headache, unspecified, not intractable: Secondary | ICD-10-CM

## 2023-02-23 LAB — URINALYSIS, ROUTINE W REFLEX MICROSCOPIC
Bilirubin Urine: NEGATIVE
Glucose, UA: NEGATIVE mg/dL
Hgb urine dipstick: NEGATIVE
Ketones, ur: NEGATIVE mg/dL
Nitrite: NEGATIVE
Protein, ur: NEGATIVE mg/dL
Specific Gravity, Urine: 1.011 (ref 1.005–1.030)
pH: 6 (ref 5.0–8.0)

## 2023-02-23 MED ORDER — ACETAMINOPHEN 500 MG PO TABS
1000.0000 mg | ORAL_TABLET | Freq: Once | ORAL | Status: AC
  Administered 2023-02-23: 1000 mg via ORAL
  Filled 2023-02-23: qty 2

## 2023-02-23 NOTE — MAU Note (Signed)
.  Tracy Guzman is a 26 y.o. at [redacted]w[redacted]d here in MAU reporting: contractions that started about 20 minutes ago, patient also reports lower abdominal cramping and pelvic pressure. Denies VB or LOF. +FM. Patient also reporting a headache, has not taken anything.   Pain score: contractions: 8    headache: 5 Vitals:   02/23/23 1224  BP: 130/70  Pulse: (!) 120  Resp: 14  Temp: 98.2 F (36.8 C)  SpO2: 99%      Lab orders placed from triage:  UA

## 2023-02-23 NOTE — MAU Provider Note (Signed)
History     CSN: [id number]  Arrival date and time: 02/23/23 1150   Event Date/Time   First Provider Initiated Contact with Patient 02/23/23 1310      Chief Complaint  Patient presents with   Contractions   HPI  Ms.Tracy Guzman is a 27 y.o. female [phone] @ [redacted]w[redacted]d here in MAU with complaints of HA and contractions. The HA is located above her eyes, the HA is constant. She has not taken anything for the HA. She rates her pain 5/10.  OB History     Gravida  4   Para  3   Term  2   Preterm  1   AB      Living  3      SAB      IAB      Ectopic      Multiple  0   Live Births  3           Past Medical History:  Diagnosis Date   Allergy    Smoke, dust, Pollen   Asthma    Depression    Endometriosis    GERD (gastroesophageal reflux disease)    Oppositional defiant disorder 06/06/2012   Polysubstance abuse (HCC) 02/12/2012   Suicidal ideation 02/12/2012    Past Surgical History:  Procedure Laterality Date   MYRINGOTOMY     TONSILLECTOMY AND ADENOIDECTOMY  as infant   WRIST SURGERY  age 42    Family History  Problem Relation Age of Onset   Cancer Maternal Grandmother    Depression Maternal Grandmother    Colon cancer Maternal Grandfather    Hypertension Father    Hyperlipidemia Father    Hypertension Mother    Depression Mother    Hyperlipidemia Mother    Drug abuse Maternal Uncle    Drug abuse Cousin     Social History   Tobacco Use   Smoking status: Former    Packs/day: 0.25    Years: 1.00    Additional pack years: 0.00    Total pack years: 0.25    Types: Cigarettes    Quit date: 08/14/2022    Years since quitting: 0.5   Smokeless tobacco: Never  Vaping Use   Vaping Use: Never used  Substance Use Topics   Alcohol use: Not Currently    Comment: not since confirmed pregnancy   Drug use: Not Currently    Types: Other-see comments, MDMA (Ecstacy), Marijuana    Comment: last used end of December    Allergies:   Allergies  Allergen Reactions   Lamotrigine Rash    Medications Prior to Admission  Medication Sig Dispense Refill Last Dose   Prenatal 28-0.8 MG TABS Take 1 tablet by mouth daily. 30 tablet 12 02/22/2023   albuterol (VENTOLIN HFA) 108 (90 Base) MCG/ACT inhaler Inhale 1-2 puffs into the lungs every 6 (six) hours as needed for wheezing or shortness of breath. 1 each 0    Blood Pressure Monitor DEVI Please check blood pressure 1-2 per week 1 each 0    Blood Pressure Monitoring (BLOOD PRESSURE KIT) DEVI 1 Device by Does not apply route once a week. 1 each 0    cyclobenzaprine (FLEXERIL) 10 MG tablet Take 1 tablet (10 mg total) by mouth 2 (two) times daily as needed for muscle spasms. 20 tablet 0    fluticasone (FLOVENT HFA) 44 MCG/ACT inhaler INHALE 2 PUFFS INTO THE LUNGS TWICE DAILY 10.6 g 5    metroNIDAZOLE (FLAGYL) 500 MG tablet Take  1 tablet (500 mg total) by mouth 2 (two) times daily. 14 tablet 0    ondansetron (ZOFRAN-ODT) 4 MG disintegrating tablet Take 1-2 tablets (4-8 mg total) by mouth every 8 (eight) hours as needed for nausea or vomiting. 30 tablet 1    Results for orders placed or performed during the hospital encounter of 02/23/23 (from the past 48 hour(s))  Urinalysis, Routine w reflex microscopic -Urine, Clean Catch     Status: Abnormal   Collection Time: 02/23/23 12:39 PM  Result Value Ref Range   Color, Urine YELLOW YELLOW   APPearance HAZY (A) CLEAR   Specific Gravity, Urine 1.011 1.005 - 1.030   pH 6.0 5.0 - 8.0   Glucose, UA NEGATIVE NEGATIVE mg/dL   Hgb urine dipstick NEGATIVE NEGATIVE   Bilirubin Urine NEGATIVE NEGATIVE   Ketones, ur NEGATIVE NEGATIVE mg/dL   Protein, ur NEGATIVE NEGATIVE mg/dL   Nitrite NEGATIVE NEGATIVE   Leukocytes,Ua TRACE (A) NEGATIVE   RBC / HPF 0-5 0 - 5 RBC/hpf   WBC, UA 6-10 0 - 5 WBC/hpf   Bacteria, UA RARE (A) NONE SEEN   Squamous Epithelial / HPF 11-20 0 - 5 /HPF    Comment: Performed at [facility], [address]  [place], [place] [address]    Review of Systems  Gastrointestinal:  Positive for abdominal pain.  Genitourinary:  Negative for vaginal bleeding.  Neurological:  Positive for headaches.   Physical Exam   Blood pressure 130/70, pulse (!) 120, temperature 98.2 F (36.8 C), temperature source Oral, resp. rate 14, weight 82.8 kg, last menstrual period 06/10/2022, SpO2 99 %, unknown if currently breastfeeding.  Patient Vitals for the past 24 hrs:  BP Temp Temp src Pulse Resp SpO2 Weight  02/23/23 1343 113/64 -- -- 99 -- -- --  02/23/23 1224 130/70 98.2 F (36.8 C) Oral (!) 120 14 99 % --  02/23/23 1218 -- -- -- -- -- -- 82.8 kg     Physical Exam Constitutional:      General: She is not in acute distress.    Appearance: Normal appearance. She is not ill-appearing, toxic-appearing or diaphoretic.  Pulmonary:     Effort: Pulmonary effort is normal.  Abdominal:     Palpations: Abdomen is soft.     Tenderness: There is no abdominal tenderness.  Genitourinary:    Comments: Dilation: Fingertip Effacement (%): Thick Station: Ballotable Presentation: Vertex Exam by:: Jeanice Lim Flippin RN  Skin:    General: Skin is warm.  Neurological:     Mental Status: She is alert and oriented to person, place, and time.    Fetal Tracing: Baseline: 130 bpm Variability: Moderate  Accelerations: 15x15 Decelerations: None Toco: UI  MAU Course  Procedures  MDM  Tylenol given. HA now 2/10.  BP grossly normal.   Assessment and Plan   A:  1. Braxton Hick's contraction   2. Acute non intractable tension-type headache   3. [redacted] weeks gestation of pregnancy      P:  Dc home Rest Ok to use tylenol as needed OTC Labor precautions  Ethelean Colla, Harolyn Rutherford, NP 02/23/2023 2:08 PM

## 2023-03-01 ENCOUNTER — Encounter: Payer: Medicaid Other | Admitting: Obstetrics and Gynecology

## 2023-03-08 ENCOUNTER — Encounter: Payer: Self-pay | Admitting: Family Medicine

## 2023-03-08 ENCOUNTER — Other Ambulatory Visit (HOSPITAL_COMMUNITY)
Admission: RE | Admit: 2023-03-08 | Discharge: 2023-03-08 | Disposition: A | Discharge status: Home / Self Care | Payer: Medicaid Other | Source: Ambulatory Visit | Attending: Family Medicine | Admitting: Family Medicine

## 2023-03-08 ENCOUNTER — Ambulatory Visit: Payer: Medicaid Other | Admitting: Family Medicine

## 2023-03-08 VITALS — BP 119/76 | HR 87 | Wt 182.2 lb

## 2023-03-08 DIAGNOSIS — Z3480 Encounter for supervision of other normal pregnancy, unspecified trimester: Secondary | ICD-10-CM

## 2023-03-08 DIAGNOSIS — O09893 Supervision of other high risk pregnancies, third trimester: Secondary | ICD-10-CM

## 2023-03-08 DIAGNOSIS — Z2839 Other underimmunization status: Secondary | ICD-10-CM

## 2023-03-08 DIAGNOSIS — Z3A38 38 weeks gestation of pregnancy: Secondary | ICD-10-CM

## 2023-03-08 DIAGNOSIS — N898 Other specified noninflammatory disorders of vagina: Secondary | ICD-10-CM

## 2023-03-08 DIAGNOSIS — O09899 Supervision of other high risk pregnancies, unspecified trimester: Secondary | ICD-10-CM

## 2023-03-08 MED ORDER — TERCONAZOLE 0.8 % VA CREA
1.0000 | TOPICAL_CREAM | Freq: Every day | VAGINAL | 0 refills | Status: DC
Start: 2023-03-08 — End: 2023-03-11

## 2023-03-08 NOTE — Progress Notes (Signed)
   PRENATAL VISIT NOTE  Subjective:  Tracy Guzman is a 27 y.o. [phone] at [redacted]w[redacted]d being seen today for ongoing prenatal care.  She is currently monitored for the following issues for this low-risk pregnancy and has Asthma; GERD; PTSD (post-traumatic stress disorder); MDD (major depressive disorder), recurrent episode, moderate (HCC); H/O fetal anomaly in prior pregnancy, currently pregnant, second trimester; Supervision of other normal pregnancy, antepartum; Rubella non-immune status, antepartum; and Unwanted fertility on their problem list.  Patient reports no complaints.  Contractions: Irritability. Vag. Bleeding: None.  Movement: Present. Denies leaking of fluid.   The following portions of the patient's history were reviewed and updated as appropriate: allergies, current medications, past family history, past medical history, past social history, past surgical history and problem list.   Objective:   Vitals:   03/08/23 0953  BP: 119/76  Pulse: 87  Weight: 182 lb 3.2 oz (82.6 kg)    Fetal Status: Fetal Heart Rate (bpm): 116 Fundal Height: 36 cm Movement: Present  Presentation: Vertex  General:  Alert, oriented and cooperative. Patient is in no acute distress.  Skin: Skin is warm and dry. No rash noted.   Cardiovascular: Normal heart rate noted  Respiratory: Normal respiratory effort, no problems with respiration noted  Abdomen: Soft, gravid, appropriate for gestational age.  Pain/Pressure: Present     Pelvic: Cervical exam performed in the presence of a chaperone Dilation: 2 Effacement (%): 40 Station: -2  Extremities: Normal range of motion.  Edema: Trace  Mental Status: Normal mood and affect. Normal behavior. Normal judgment and thought content.   Assessment and Plan:  Pregnancy: [id number] at [redacted]w[redacted]d 1. Supervision of other normal pregnancy, antepartum Continue routine prenatal care.  2. Rubella non-immune status, antepartum MMR pp  3. Vagina itching C/w yeast-rx sent  in Check wet prep - Cervicovaginal ancillary only - terconazole (TERAZOL 3) 0.8 % vaginal cream; Place 1 applicator vaginally at bedtime.  Dispense: 20 g; Refill: 0  Term labor symptoms and general obstetric precautions including but not limited to vaginal bleeding, contractions, leaking of fluid and fetal movement were reviewed in detail with the patient. Please refer to After Visit Summary for other counseling recommendations.   Return in 1 week (on 03/15/2023).  Future Appointments  Date Time Provider Department Center  03/15/2023 10:15 AM [place] Bing, MD [facility]  03/24/2023 12:00 AM [facility] SCHED ROOM [facility] None    Reva Bores, MD

## 2023-03-09 ENCOUNTER — Other Ambulatory Visit: Payer: Self-pay

## 2023-03-09 ENCOUNTER — Inpatient Hospital Stay (HOSPITAL_COMMUNITY)
Admission: AD | Admit: 2023-03-09 | Discharge: 2023-03-11 | DRG: 798 | Disposition: A | Discharge status: Home / Self Care | Payer: Medicaid Other | Attending: Family Medicine | Admitting: Family Medicine

## 2023-03-09 ENCOUNTER — Inpatient Hospital Stay (HOSPITAL_COMMUNITY): Payer: Medicaid Other | Admitting: Anesthesiology

## 2023-03-09 ENCOUNTER — Encounter (HOSPITAL_COMMUNITY): Payer: Self-pay | Admitting: Obstetrics and Gynecology

## 2023-03-09 DIAGNOSIS — Z3A38 38 weeks gestation of pregnancy: Secondary | ICD-10-CM

## 2023-03-09 DIAGNOSIS — O480 Post-term pregnancy: Principal | ICD-10-CM | POA: Diagnosis present

## 2023-03-09 DIAGNOSIS — J45909 Unspecified asthma, uncomplicated: Secondary | ICD-10-CM | POA: Diagnosis present

## 2023-03-09 DIAGNOSIS — O368130 Decreased fetal movements, third trimester, not applicable or unspecified: Secondary | ICD-10-CM | POA: Diagnosis not present

## 2023-03-09 DIAGNOSIS — O458X3 Other premature separation of placenta, third trimester: Secondary | ICD-10-CM | POA: Diagnosis not present

## 2023-03-09 DIAGNOSIS — Z3480 Encounter for supervision of other normal pregnancy, unspecified trimester: Principal | ICD-10-CM

## 2023-03-09 DIAGNOSIS — Z79899 Other long term (current) drug therapy: Secondary | ICD-10-CM

## 2023-03-09 DIAGNOSIS — O26893 Other specified pregnancy related conditions, third trimester: Secondary | ICD-10-CM | POA: Diagnosis present

## 2023-03-09 DIAGNOSIS — Z302 Encounter for sterilization: Secondary | ICD-10-CM | POA: Diagnosis not present

## 2023-03-09 DIAGNOSIS — Z3A39 39 weeks gestation of pregnancy: Secondary | ICD-10-CM | POA: Diagnosis not present

## 2023-03-09 DIAGNOSIS — Z87891 Personal history of nicotine dependence: Secondary | ICD-10-CM

## 2023-03-09 DIAGNOSIS — Z560 Unemployment, unspecified: Secondary | ICD-10-CM | POA: Diagnosis not present

## 2023-03-09 DIAGNOSIS — O99214 Obesity complicating childbirth: Secondary | ICD-10-CM | POA: Diagnosis present

## 2023-03-09 DIAGNOSIS — O9952 Diseases of the respiratory system complicating childbirth: Secondary | ICD-10-CM | POA: Diagnosis present

## 2023-03-09 DIAGNOSIS — Z2839 Other underimmunization status: Secondary | ICD-10-CM

## 2023-03-09 DIAGNOSIS — O09899 Supervision of other high risk pregnancies, unspecified trimester: Secondary | ICD-10-CM

## 2023-03-09 LAB — CBC
HCT: 33.5 % — ABNORMAL LOW (ref 36.0–46.0)
Hemoglobin: 11.1 g/dL — ABNORMAL LOW (ref 12.0–15.0)
MCH: 26.9 pg (ref 26.0–34.0)
MCHC: 33.1 g/dL (ref 30.0–36.0)
MCV: 81.1 fL (ref 80.0–100.0)
Platelets: 149 10*3/uL — ABNORMAL LOW (ref 150–400)
RBC: 4.13 MIL/uL (ref 3.87–5.11)
RDW: 15.3 % (ref 11.5–15.5)
WBC: 6.3 10*3/uL (ref 4.0–10.5)
nRBC: 0 % (ref 0.0–0.2)

## 2023-03-09 LAB — CERVICOVAGINAL ANCILLARY ONLY
Bacterial Vaginitis (gardnerella): POSITIVE — AB
Candida Glabrata: NEGATIVE
Candida Vaginitis: POSITIVE — AB
Chlamydia: NEGATIVE
Comment: NEGATIVE
Comment: NEGATIVE
Comment: NEGATIVE
Comment: NEGATIVE
Comment: NEGATIVE
Comment: NORMAL
Neisseria Gonorrhea: NEGATIVE
Trichomonas: POSITIVE — AB

## 2023-03-09 LAB — TYPE AND SCREEN
ABO/RH(D): B POS
Antibody Screen: NEGATIVE

## 2023-03-09 LAB — POCT FERN TEST: POCT Fern Test: NEGATIVE

## 2023-03-09 MED ORDER — ACETAMINOPHEN 325 MG PO TABS: 650.0000 mg | ORAL_TABLET | ORAL | Status: DC | PRN

## 2023-03-09 MED ORDER — LACTATED RINGERS IV SOLN
500.0000 mL | Freq: Once | INTRAVENOUS | Status: AC
  Administered 2023-03-09: 500 mL via INTRAVENOUS

## 2023-03-09 MED ORDER — EPHEDRINE 5 MG/ML INJ: 10.0000 mg | INTRAVENOUS | Status: DC | PRN

## 2023-03-09 MED ORDER — ONDANSETRON HCL 4 MG/2ML IJ SOLN: 4.0000 mg | Freq: Four times a day (QID) | INTRAMUSCULAR | Status: DC | PRN

## 2023-03-09 MED ORDER — SOD CITRATE-CITRIC ACID 500-334 MG/5ML PO SOLN: 30.0000 mL | ORAL | Status: DC | PRN

## 2023-03-09 MED ORDER — LACTATED RINGERS IV SOLN: 500.0000 mL | INTRAVENOUS | Status: DC | PRN

## 2023-03-09 MED ORDER — LACTATED RINGERS IV SOLN: INTRAVENOUS | Status: DC

## 2023-03-09 MED ORDER — LIDOCAINE HCL (PF) 1 % IJ SOLN
INTRAMUSCULAR | Status: DC | PRN
  Administered 2023-03-09: 2 mL via EPIDURAL
  Administered 2023-03-09: 3 mL via EPIDURAL
  Administered 2023-03-09: 5 mL via EPIDURAL

## 2023-03-09 MED ORDER — METRONIDAZOLE 500 MG PO TABS
500.0000 mg | ORAL_TABLET | Freq: Two times a day (BID) | ORAL | Status: DC
  Administered 2023-03-09: 500 mg via ORAL
  Filled 2023-03-09: qty 1

## 2023-03-09 MED ORDER — OXYTOCIN-SODIUM CHLORIDE 30-0.9 UT/500ML-% IV SOLN
2.5000 [IU]/h | INTRAVENOUS | Status: DC
  Filled 2023-03-09: qty 500

## 2023-03-09 MED ORDER — OXYCODONE-ACETAMINOPHEN 5-325 MG PO TABS: 2.0000 | ORAL_TABLET | ORAL | Status: DC | PRN

## 2023-03-09 MED ORDER — MISOPROSTOL 25 MCG QUARTER TABLET: 25.0000 ug | ORAL_TABLET | Freq: Once | ORAL | Status: DC

## 2023-03-09 MED ORDER — PHENYLEPHRINE 80 MCG/ML (10ML) SYRINGE FOR IV PUSH (FOR BLOOD PRESSURE SUPPORT)
80.0000 ug | PREFILLED_SYRINGE | INTRAVENOUS | Status: DC | PRN
  Administered 2023-03-09: 80 ug via INTRAVENOUS

## 2023-03-09 MED ORDER — OXYTOCIN BOLUS FROM INFUSION: 333.0000 mL | Freq: Once | INTRAVENOUS | Status: DC

## 2023-03-09 MED ORDER — OXYTOCIN-SODIUM CHLORIDE 30-0.9 UT/500ML-% IV SOLN
1.0000 m[IU]/min | INTRAVENOUS | Status: DC
  Administered 2023-03-09: 2 m[IU]/min via INTRAVENOUS

## 2023-03-09 MED ORDER — FLEET ENEMA 7-19 GM/118ML RE ENEM: 1.0000 | ENEMA | RECTAL | Status: DC | PRN

## 2023-03-09 MED ORDER — OXYTOCIN BOLUS FROM INFUSION
333.0000 mL | Freq: Once | INTRAVENOUS | Status: AC
  Administered 2023-03-10: 333 mL via INTRAVENOUS

## 2023-03-09 MED ORDER — FENTANYL CITRATE (PF) 100 MCG/2ML IJ SOLN: 50.0000 ug | INTRAMUSCULAR | Status: DC | PRN

## 2023-03-09 MED ORDER — OXYCODONE-ACETAMINOPHEN 5-325 MG PO TABS: 1.0000 | ORAL_TABLET | ORAL | Status: DC | PRN

## 2023-03-09 MED ORDER — OXYTOCIN-SODIUM CHLORIDE 30-0.9 UT/500ML-% IV SOLN: 2.5000 [IU]/h | INTRAVENOUS | Status: DC

## 2023-03-09 MED ORDER — PHENYLEPHRINE 80 MCG/ML (10ML) SYRINGE FOR IV PUSH (FOR BLOOD PRESSURE SUPPORT)
80.0000 ug | PREFILLED_SYRINGE | INTRAVENOUS | Status: DC | PRN
  Filled 2023-03-09: qty 10

## 2023-03-09 MED ORDER — FENTANYL-BUPIVACAINE-NACL 0.5-0.125-0.9 MG/250ML-% EP SOLN
12.0000 mL/h | EPIDURAL | Status: DC | PRN
  Administered 2023-03-09: 12 mL/h via EPIDURAL
  Filled 2023-03-09: qty 250

## 2023-03-09 MED ORDER — LIDOCAINE HCL (PF) 1 % IJ SOLN: 30.0000 mL | INTRAMUSCULAR | Status: DC | PRN

## 2023-03-09 MED ORDER — DIPHENHYDRAMINE HCL 50 MG/ML IJ SOLN
12.5000 mg | INTRAMUSCULAR | Status: DC | PRN
  Administered 2023-03-09: 12.5 mg via INTRAVENOUS
  Filled 2023-03-09: qty 1

## 2023-03-09 MED ORDER — TERBUTALINE SULFATE 1 MG/ML IJ SOLN: 0.2500 mg | Freq: Once | INTRAMUSCULAR | Status: DC | PRN

## 2023-03-09 MED ORDER — LACTATED RINGERS IV SOLN: 500.0000 mL | Freq: Once | INTRAVENOUS | Status: DC

## 2023-03-09 NOTE — Anesthesia Preprocedure Evaluation (Signed)
Anesthesia Evaluation  Patient identified by MRN, date of birth, ID band Patient awake    Reviewed: Allergy & Precautions, NPO status , Patient's Chart, lab work & pertinent test results  Airway Mallampati: II  TM Distance: >3 FB Neck ROM: Full    Dental  (+) Dental Advisory Given, Poor Dentition   Pulmonary asthma , former smoker   Pulmonary exam normal breath sounds clear to auscultation       Cardiovascular negative cardio ROS Normal cardiovascular exam Rhythm:Regular Rate:Normal     Neuro/Psych  PSYCHIATRIC DISORDERS Anxiety Depression    negative neurological ROS     GI/Hepatic ,GERD  ,,(+)     substance abuse    Endo/Other  Obesity   Renal/GU negative Renal ROS     Musculoskeletal negative musculoskeletal ROS (+)    Abdominal   Peds  Hematology  (+) Blood dyscrasia (Plt 149k), anemia   Anesthesia Other Findings Day of surgery medications reviewed with the patient.  Reproductive/Obstetrics                              Anesthesia Physical Anesthesia Plan  ASA: 2  Anesthesia Plan: Epidural   Post-op Pain Management:    Induction:   PONV Risk Score and Plan: 2 and Treatment may vary due to age or medical condition  Airway Management Planned: Natural Airway  Additional Equipment:   Intra-op Plan:   Post-operative Plan:   Informed Consent: I have reviewed the patients History and Physical, chart, labs and discussed the procedure including the risks, benefits and alternatives for the proposed anesthesia with the patient or authorized representative who has indicated his/her understanding and acceptance.     Dental advisory given  Plan Discussed with:   Anesthesia Plan Comments: (Patient identified. Risks/Benefits/Options discussed with patient including but not limited to bleeding, infection, nerve damage, paralysis, failed block, incomplete pain control, headache,  blood pressure changes, nausea, vomiting, reactions to medication both or allergic, itching and postpartum back pain. Confirmed with bedside nurse the patient's most recent platelet count. Confirmed with patient that they are not currently taking any anticoagulation, have any bleeding history or any family history of bleeding disorders. Patient expressed understanding and wished to proceed. All questions were answered. )         Anesthesia Quick Evaluation

## 2023-03-09 NOTE — Progress Notes (Signed)
Patient ID: Tracy Guzman, female   DOB: 03/16/1996, 27 y.o.   MRN: [id number] LABOR NOTE Tracy Guzman is a 27 y.o. [phone] at [redacted]w[redacted]d admitted for early labor  Subjective: comfortable with epidural  Objective: BP 115/60   Pulse 76   Temp 98.1 F (36.7 C) (Oral)   Resp 16   LMP 06/10/2022   SpO2 99%  Total I/O In: -  Out: 900 [Urine:900]  FHR baseline 125 bpm, Variability: moderate, Accelerations:present, Decelerations:  Absent Toco: q 2-3 mins   SVE:   Dilation: 5 Effacement (%): 50 Station: -2, -3 Exam by:: Joellyn Haff CNM Some blood on bed Attempted AROM, small-mod amt blood/clots, no fluid suspicious for partial marginal abruption  Pitocin @ 6 mu/min  Labs: Lab Results  Component Value Date   WBC 6.3 03/09/2023   HGB 11.1 (L) 03/09/2023   HCT 33.5 (L) 03/09/2023   MCV 81.1 03/09/2023   PLT 149 (L) 03/09/2023    Assessment / Plan: Admitted in early labor, cx unchanged since 1615, attempted AROM, vtx still high, felt like I snagged bag, no fluid, only clots noted as I withdrew my fingers. Suspicious for partial marginal abruption, Cat 1FHR, mom stable.  Updated Dr. Shawnie Pons.   Labor: early Fetal Wellbeing:  Category I Pain Control:  epidural Pre-eclampsia: N/A I/D:  GBS neg Anticipated MOD: NSVB  Cheral Marker CNM, WHNP-BC 03/09/2023, 8:45 PM

## 2023-03-09 NOTE — Anesthesia Procedure Notes (Signed)
Epidural Patient location during procedure: OB Start time: 03/09/2023 3:32 PM End time: 03/09/2023 3:39 PM  Staffing Anesthesiologist: Collene Schlichter, MD Performed: anesthesiologist   Preanesthetic Checklist Completed: patient identified, IV checked, risks and benefits discussed, monitors and equipment checked, pre-op evaluation and timeout performed  Epidural Patient position: sitting Prep: DuraPrep Patient monitoring: blood pressure and continuous pulse ox Approach: midline Location: L3-L4 Injection technique: LOR air  Needle:  Needle type: Tuohy  Needle gauge: 17 G Needle length: 9 cm Needle insertion depth: 6 cm Catheter size: 19 Gauge Catheter at skin depth: 11 cm Test dose: negative and Other (1% Lidocaine)  Additional Notes Patient identified.  Risk benefits discussed including failed block, incomplete pain control, headache, nerve damage, paralysis, blood pressure changes, nausea, vomiting, reactions to medication both toxic or allergic, and postpartum back pain.  Patient expressed understanding and wished to proceed.  All questions were answered.  Sterile technique used throughout procedure and epidural site dressed with sterile barrier dressing. No paresthesia or other complications noted. The patient did not experience any signs of intravascular injection such as tinnitus or metallic taste in mouth nor signs of intrathecal spread such as rapid motor block. Please see nursing notes for vital signs. Reason for block:procedure for pain

## 2023-03-09 NOTE — H&P (Addendum)
Tracy Guzman is a 27 y.o. female presenting for [id number] at [redacted]w[redacted]d by LMP with PMHx notable for asthma, GERD, PTSD, MDD, who presented with contractions and lower back pain that began today. Pt states that the contractions have occurred every 6-9 minutes. She also notes a small amount of bloody vaginal discharge. Pt states she has had decreased fetal movements today, especially when laying on her back.      Vaginal bleeding: bloody show LOF: No Fetal Movement: Yes decreased Contractions: Yes  OB History     Gravida  4   Para  3   Term  2   Preterm  1   AB      Living  3      SAB      IAB      Ectopic      Multiple  0   Live Births  3          Past Medical History:  Diagnosis Date   Abnormal prenatal ultrasound 03/12/2016   Allergy    Smoke, dust, Pollen   Anxiety 08/18/2017   Asthma    Depression    Drug use complicating pregnancy in third trimester 03/12/2016   Endometriosis    GERD (gastroesophageal reflux disease)    Oppositional defiant disorder 06/06/2012   Polysubstance abuse (HCC) 02/12/2012   Prenatal consult 01/20/2016   Formatting of this note might be different from the original. Suspect ASD.  Fine to deliver in [place] and come see me in clinic a few weeks after delivery. Formatting of this note might be different from the original. Suspect ASD.  Fine to deliver in [place] and come see me in clinic a few weeks after delivery.   Right lower quadrant pain 08/13/2011   Seasonal allergies 12/11/2014   Suicidal ideation 02/12/2012   Tobacco smoking complicating pregnancy in third trimester 03/12/2016   Past Surgical History:  Procedure Laterality Date   ADENOIDECTOMY     MYRINGOTOMY     TONSILLECTOMY AND ADENOIDECTOMY  as infant   WRIST SURGERY  age 75   Family History: family history includes Cancer in her maternal grandmother; Colon cancer in her maternal grandfather; Depression in her maternal grandmother and mother; Drug abuse  in her cousin and maternal uncle; Hyperlipidemia in her father and mother; Hypertension in her father and mother. Social History:  reports that she quit smoking about 6 months ago. Her smoking use included cigarettes. She has a 0.25 pack-year smoking history. She has never used smokeless tobacco. She reports that she does not currently use alcohol. She reports that she does not currently use drugs after having used the following drugs: Other-see comments, MDMA (Ecstacy), and Marijuana.     Maternal Diabetes: No Genetic Screening: Normal Maternal Ultrasounds/Referrals: Normal Fetal Ultrasounds or other Referrals:  None Maternal Substance Abuse:  No Significant Maternal Medications:  Meds include: Other:  albuterol inhaler PRN, fluticasone inhaler 2 puffs BID Significant Maternal Lab Results:  Group B Strep negative Number of Prenatal Visits:greater than 3 verified prenatal visits Other Comments:  None  Review of Systems  Genitourinary:  Positive for vaginal discharge.  Musculoskeletal:  Positive for back pain.   Maternal Medical History:  Reason for admission: Contractions.   Contractions: Onset was 3-5 hours ago.   Frequency: regular.   Perceived severity is strong.   Fetal activity: Perceived fetal activity is normal and recently changed.   Last perceived fetal movement was within the past hour.     Dilation: 4.5 Effacement (%):  50 Station: -3 Exam by:: Mercado-Ortiz,MD Blood pressure 117/73, pulse 93, temperature 98.1 F (36.7 C), temperature source Oral, resp. rate 15, last menstrual period 06/10/2022, SpO2 99 %, unknown if currently breastfeeding.  Vitals and nursing note reviewed. Exam conducted with a chaperone present.  Constitutional:      General: She is not in acute distress. Cardiovascular:     Rate and Rhythm: Normal rate and regular rhythm.  Abdominal:     Tenderness: There is no abdominal tenderness.     Comments: Gravid  Neurological:     Mental Status: She  is alert.  Psychiatric:        Mood and Affect: Mood normal.        Behavior: Behavior normal.   Prenatal labs: ABO, Rh: B/Positive/-- (01/09 1413) Antibody: Negative (01/09 1413) Rubella: <0.90 (01/09 1413) RPR: Non Reactive (04/24 0841)  HBsAg: Negative (01/09 1413)  HIV: Non Reactive (04/24 0841)  GBS: Negative/-- (06/13 1702)   Assessment/Plan: Normal labor Continue to monitor progression- Anticipate SVD Epidural desired GBS neg Asthma - continue flovent, avoid hemabate Rubella NI - MMR pp   Para March, DO 03/09/2023, 2:08 PM  ___ GME ATTESTATION:  Evaluation and management procedures were performed by the [facility] Medicine Resident under my supervision. I was immediately available for direct supervision, assistance and direction throughout this encounter.  I also confirm that I have verified the information documented in the resident's note, and that I have also personally reperformed the pertinent components of the physical exam and all of the medical decision making activities.  I have also made any necessary editorial changes.  Myrtie Hawk, DO OB Fellow, Faculty [facility], Center for [facility] 03/09/2023 2:21 PM

## 2023-03-09 NOTE — MAU Note (Signed)
.  Tracy Guzman is a 27 y.o. at [redacted]w[redacted]d here in MAU reporting:   Contractions every: 6-9 minutes Onset of ctx: Today Pain score: 7/10  ROM: Intact Vaginal Bleeding: Bloody show Last SVE: 2cm  Epidural: Planning  Fetal Movement: Reports decreased FM FHT:153 via External  There were no vitals filed for this visit.     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

## 2023-03-09 NOTE — MAU Provider Note (Addendum)
MAU Provider Note  History  [id number]  Arrival date and time: 03/09/23 1131   Chief Complaint  Patient presents with   Contractions   Back Pain     HPI Tracy Guzman is a 27 y.o. [id number] at [redacted]w[redacted]d by LMP with PMHx notable for asthma, GERD, PTSD, MDD, who presents for contractions and lower back pain that began today. Pt states that the contractions have occurred every 6-9 minutes. She also notes a small amount of bloody vaginal discharge. Pt states she has had decreased fetal movements today, especially when laying on her back.    Vaginal bleeding: bloody show LOF: No Fetal Movement: Yes decreased Contractions: Yes  B/Positive/-- (01/09 1413)  OB History  Gravida Para Term Preterm AB Living  4 3 2 1   3   SAB IAB Ectopic Multiple Live Births        0 3    # Outcome Date GA Lbr Len/2nd Weight Sex Delivery Anes PTL Lv  4 Current           3 Term 05/04/21 [redacted]w[redacted]d 12:15 / 00:11 3905 g M Vag-Spont EPI  LIV  2 Preterm 04/23/16 [redacted]w[redacted]d   M Vag-Spont   LIV  1 Term 06/01/15 [redacted]w[redacted]d   F Vag-Spont   LIV     Past Medical History:  Diagnosis Date   Abnormal prenatal ultrasound 03/12/2016   Allergy    Smoke, dust, Pollen   Anxiety 08/18/2017   Asthma    Depression    Drug use complicating pregnancy in third trimester 03/12/2016   Endometriosis    GERD (gastroesophageal reflux disease)    Oppositional defiant disorder 06/06/2012   Polysubstance abuse (HCC) 02/12/2012   Prenatal consult 01/20/2016   Formatting of this note might be different from the original. Suspect ASD.  Fine to deliver in [place] and come see me in clinic a few weeks after delivery. Formatting of this note might be different from the original. Suspect ASD.  Fine to deliver in [place] and come see me in clinic a few weeks after delivery.   Right lower quadrant pain 08/13/2011   Seasonal allergies 12/11/2014   Suicidal ideation 02/12/2012   Tobacco smoking complicating pregnancy in third trimester  03/12/2016    Past Surgical History:  Procedure Laterality Date   ADENOIDECTOMY     MYRINGOTOMY     TONSILLECTOMY AND ADENOIDECTOMY  as infant   WRIST SURGERY  age 18    Family History  Problem Relation Age of Onset   Cancer Maternal Grandmother    Depression Maternal Grandmother    Colon cancer Maternal Grandfather    Hypertension Father    Hyperlipidemia Father    Hypertension Mother    Depression Mother    Hyperlipidemia Mother    Drug abuse Maternal Uncle    Drug abuse Cousin     Social History   Socioeconomic History   Marital status: Single    Spouse name: Not on file   Number of children: Not on file   Years of education: Not on file   Highest education level: GED or equivalent  Occupational History   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  Tobacco Use   Smoking status: Former    Packs/day: 0.25    Years: 1.00    Additional pack years: 0.00    Total pack years: 0.25    Types: Cigarettes    Quit date: 08/14/2022    Years since quitting: 0.5  Smokeless tobacco: Never  Vaping Use   Vaping Use: Never used  Substance and Sexual Activity   Alcohol use: Not Currently    Comment: not since confirmed pregnancy   Drug use: Not Currently    Types: Other-see comments, MDMA (Ecstacy), Marijuana    Comment: last used end of December   Sexual activity: Yes    Partners: Male    Birth control/protection: None  Other Topics Concern   Not on file  Social History Narrative   ** Merged History Encounter **       Social Determinants of Health   Financial Resource Strain: Low Risk  (12/08/2022)   Overall Financial Resource Strain (CARDIA)    Difficulty of Paying Living Expenses: Not very hard  Food Insecurity: No Food Insecurity (12/08/2022)   Hunger Vital Sign    Worried About Running Out of Food in the Last Year: Never true    Ran Out of Food in the Last Year: Never true  Transportation Needs: No Transportation Needs (12/08/2022)    PRAPARE - Administrator, Civil Service (Medical): No    Lack of Transportation (Non-Medical): No  Physical Activity: Unknown (12/08/2022)   Exercise Vital Sign    Days of Exercise per Week: 0 days    Minutes of Exercise per Session: Not on file  Stress: No Stress Concern Present (12/08/2022)   [employer] of Occupational Health - Occupational Stress Questionnaire    Feeling of Stress : Not at all  Social Connections: Unknown (12/08/2022)   Social Connection and Isolation Panel [NHANES]    Frequency of Communication with Friends and Family: Twice a week    Frequency of Social Gatherings with Friends and Family: Patient declined    Attends Religious Services: More than 4 times per year    Active Member of [employer] or Organizations: Yes    Attends Engineer, structural: More than 4 times per year    Marital Status: Never married  Intimate Partner Violence: Not on file    Allergies  Allergen Reactions   Lamotrigine Rash    No current facility-administered medications on file prior to encounter.   Current Outpatient Medications on File Prior to Encounter  Medication Sig Dispense Refill   cyclobenzaprine (FLEXERIL) 10 MG tablet Take 1 tablet (10 mg total) by mouth 2 (two) times daily as needed for muscle spasms. 20 tablet 0   fluticasone (FLOVENT HFA) 44 MCG/ACT inhaler INHALE 2 PUFFS INTO THE LUNGS TWICE DAILY 10.6 g 5   ondansetron (ZOFRAN-ODT) 4 MG disintegrating tablet Take 1-2 tablets (4-8 mg total) by mouth every 8 (eight) hours as needed for nausea or vomiting. 30 tablet 1   Prenatal 28-0.8 MG TABS Take 1 tablet by mouth daily. 30 tablet 12   terconazole (TERAZOL 3) 0.8 % vaginal cream Place 1 applicator vaginally at bedtime. 20 g 0   albuterol (VENTOLIN HFA) 108 (90 Base) MCG/ACT inhaler Inhale 1-2 puffs into the lungs every 6 (six) hours as needed for wheezing or shortness of breath. 1 each 0   Blood Pressure Monitor DEVI Please check blood pressure 1-2 per  week 1 each 0   Blood Pressure Monitoring (BLOOD PRESSURE KIT) DEVI 1 Device by Does not apply route once a week. 1 each 0   metroNIDAZOLE (FLAGYL) 500 MG tablet Take 1 tablet (500 mg total) by mouth 2 (two) times daily. 14 tablet 0   [DISCONTINUED] mometasone-formoterol (DULERA) 100-5 MCG/ACT AERO Inhale 2 puffs into the lungs 2 (two) times  daily.      ROS: Pertinent positives and negative per HPI  Physical Exam   BP 117/73   Pulse 93   Temp 98.1 F (36.7 C) (Oral)   Resp 15   LMP 06/10/2022   SpO2 99%   Patient Vitals for the past 24 hrs:  BP Temp Temp src Pulse Resp SpO2  03/09/23 1349 -- -- -- -- -- 99 %  03/09/23 1344 -- -- -- -- -- 98 %  03/09/23 1339 -- -- -- -- -- 98 %  03/09/23 1334 -- -- -- -- -- 98 %  03/09/23 1332 117/73 -- -- 93 -- --  03/09/23 1324 -- -- -- -- -- 98 %  03/09/23 1319 -- -- -- -- -- 98 %  03/09/23 1314 -- -- -- -- -- 99 %  03/09/23 1309 -- -- -- -- -- 97 %  03/09/23 1304 -- -- -- -- -- 98 %  03/09/23 1259 -- -- -- -- -- 98 %  03/09/23 1254 -- -- -- -- -- 98 %  03/09/23 1249 -- -- -- -- -- 98 %  03/09/23 1244 -- -- -- -- -- 98 %  03/09/23 1239 -- -- -- -- -- 99 %  03/09/23 1234 -- -- -- -- -- 99 %  03/09/23 1229 -- -- -- -- -- 98 %  03/09/23 1224 -- -- -- -- -- 99 %  03/09/23 1219 -- -- -- -- -- 98 %  03/09/23 1214 -- -- -- -- -- 98 %  03/09/23 1209 -- -- -- -- -- 99 %  03/09/23 1204 -- -- -- -- -- 99 %  03/09/23 1159 -- -- -- -- -- 99 %  03/09/23 1154 -- -- -- -- -- 99 %  03/09/23 1149 -- -- -- -- -- 99 %  03/09/23 1142 119/68 98.1 F (36.7 C) Oral (!) 119 15 --    Physical Exam Vitals and nursing note reviewed. Exam conducted with a chaperone present.  Constitutional:      General: She is not in acute distress. HENT:     Head: Atraumatic.     Right Ear: External ear normal.     Left Ear: External ear normal.     Nose: Nose normal.     Mouth/Throat:     Mouth: Mucous membranes are moist.     Pharynx: Oropharynx is clear.  Eyes:      Pupils: Pupils are equal, round, and reactive to light.  Cardiovascular:     Rate and Rhythm: Normal rate and regular rhythm.  Abdominal:     Tenderness: There is no abdominal tenderness.     Comments: Gravid  Genitourinary:    General: Normal vulva.     Comments: Pelvic: Speculum exam performed showing mild blood tinged vaginal discharge. Cervix visualized and 3/50/-3. No vaginal wall abnormalities noted.  Neurological:     Mental Status: She is alert.  Psychiatric:        Mood and Affect: Mood normal.        Behavior: Behavior normal.     Cervical Exam Dilation: 4.5 Effacement (%): 50 Station: -3 Presentation: Vertex Exam by:: Mercado-Ortiz,MD  Bedside Ultrasound Done, vertex presentation  FHT 160bpm/Moderate variability/ none accels/ None decels CAT: 1 Toco: regular every 6-8 minutes  Labs Results for orders placed or performed during the hospital encounter of 03/09/23 (from the past 24 hour(s))  Fern Test     Status: Normal   Collection Time: 03/09/23 12:47 PM  Result Value Ref  Range   POCT Fern Test Negative = intact amniotic membranes     Imaging No results found.  MAU Course  MDM:   1152: Fetal HR deceleration noted, pt turned to right lateral side with improvement of FHT. Unclear if late or early deceleration.   1215: Pt declines any medication for pain at this time   1350: Pt now 4.5cm dilated, contractions more frequent every 4-5 minutes, will send pt to L&D  This patient presents to the ED for concern of   Chief Complaint  Patient presents with   Contractions   Back Pain     External records from outside source obtained and reviewed including Prenatal care records   MAU Course: 27 year old female G4 P2-1-0-3 at 38 weeks 6 days presented with contractions and lower back pain.  Patient now 4.5 cm with more frequent contractions.  Will admit to labor and delivery.  Transfer of care to Dr. Nobie Putnam.    Dispostion: admitted to the  hospital   Assessment and Plan  1. Normal labor  2. Supervision of other normal pregnancy, antepartum - CBC; Standing - Type and screen; Standing - RPR; Standing - CBC - Type and screen - RPR  3. [redacted] weeks gestation of pregnancy  Admit to L&D, transfer of care to Dr. Nobie Putnam Orders placed Epidural desired GBS neg Rubella NI- MMR pp Asthma- continue flovent, avoid hemabate  Para March, DO Resident, Faculty practice [facility], Center for [facility] 03/09/23  2:08 PM __ GME ATTESTATION:  Evaluation and management procedures were performed by the [facility] Medicine Resident under my supervision. I was immediately available for direct supervision, assistance and direction throughout this encounter.  I also confirm that I have verified the information documented in the resident's note, and that I have also personally reperformed the pertinent components of the physical exam and all of the medical decision making activities.  I have also made any necessary editorial changes.  Myrtie Hawk, DO OB Fellow, Faculty [facility], Center for [facility] 03/09/2023 2:11 PM

## 2023-03-10 ENCOUNTER — Other Ambulatory Visit: Payer: Self-pay

## 2023-03-10 ENCOUNTER — Encounter (HOSPITAL_COMMUNITY): Payer: Self-pay | Admitting: Obstetrics and Gynecology

## 2023-03-10 ENCOUNTER — Encounter (HOSPITAL_COMMUNITY)
Admission: AD | Disposition: A | Discharge status: Home / Self Care | Payer: Self-pay | Source: Home / Self Care | Attending: Family Medicine

## 2023-03-10 ENCOUNTER — Inpatient Hospital Stay (HOSPITAL_COMMUNITY): Payer: Medicaid Other | Admitting: Anesthesiology

## 2023-03-10 DIAGNOSIS — Z3A39 39 weeks gestation of pregnancy: Secondary | ICD-10-CM

## 2023-03-10 DIAGNOSIS — O368130 Decreased fetal movements, third trimester, not applicable or unspecified: Secondary | ICD-10-CM

## 2023-03-10 DIAGNOSIS — Z302 Encounter for sterilization: Secondary | ICD-10-CM

## 2023-03-10 DIAGNOSIS — O458X3 Other premature separation of placenta, third trimester: Secondary | ICD-10-CM

## 2023-03-10 HISTORY — PX: TUBAL LIGATION: SHX77

## 2023-03-10 LAB — RPR: RPR Ser Ql: NONREACTIVE

## 2023-03-10 SURGERY — LIGATION, FALLOPIAN TUBE, POSTPARTUM
Anesthesia: Epidural

## 2023-03-10 MED ORDER — LIDOCAINE-EPINEPHRINE (PF) 2 %-1:200000 IJ SOLN
INTRAMUSCULAR | Status: AC
  Filled 2023-03-10: qty 20

## 2023-03-10 MED ORDER — KETOROLAC TROMETHAMINE 30 MG/ML IJ SOLN
INTRAMUSCULAR | Status: DC | PRN
  Administered 2023-03-10: 30 mg via INTRAVENOUS

## 2023-03-10 MED ORDER — SODIUM CHLORIDE 0.9% FLUSH: 3.0000 mL | INTRAVENOUS | Status: DC | PRN

## 2023-03-10 MED ORDER — ACETAMINOPHEN 10 MG/ML IV SOLN: 1000.0000 mg | Freq: Once | INTRAVENOUS | Status: DC | PRN

## 2023-03-10 MED ORDER — METRONIDAZOLE 500 MG PO TABS
1500.0000 mg | ORAL_TABLET | Freq: Once | ORAL | Status: AC
  Administered 2023-03-10: 1500 mg via ORAL
  Filled 2023-03-10: qty 3

## 2023-03-10 MED ORDER — ONDANSETRON HCL 4 MG PO TABS: 4.0000 mg | ORAL_TABLET | ORAL | Status: DC | PRN

## 2023-03-10 MED ORDER — FLEET ENEMA 7-19 GM/118ML RE ENEM: 1.0000 | ENEMA | Freq: Every day | RECTAL | Status: DC | PRN

## 2023-03-10 MED ORDER — DEXMEDETOMIDINE HCL IN NACL 80 MCG/20ML IV SOLN
INTRAVENOUS | Status: AC
  Filled 2023-03-10: qty 20

## 2023-03-10 MED ORDER — ONDANSETRON HCL 4 MG/2ML IJ SOLN
INTRAMUSCULAR | Status: AC
  Filled 2023-03-10: qty 2

## 2023-03-10 MED ORDER — ACETAMINOPHEN 160 MG/5ML PO SOLN: 325.0000 mg | ORAL | Status: DC | PRN

## 2023-03-10 MED ORDER — FENTANYL CITRATE (PF) 100 MCG/2ML IJ SOLN
INTRAMUSCULAR | Status: AC
  Filled 2023-03-10: qty 2

## 2023-03-10 MED ORDER — ACETAMINOPHEN 10 MG/ML IV SOLN
INTRAVENOUS | Status: DC | PRN
  Administered 2023-03-10: 1000 mg via INTRAVENOUS

## 2023-03-10 MED ORDER — METOCLOPRAMIDE HCL 10 MG PO TABS
10.0000 mg | ORAL_TABLET | Freq: Once | ORAL | Status: AC
  Administered 2023-03-10: 10 mg via ORAL
  Filled 2023-03-10: qty 1

## 2023-03-10 MED ORDER — OXYCODONE HCL 5 MG PO TABS: 5.0000 mg | ORAL_TABLET | Freq: Once | ORAL | Status: DC | PRN

## 2023-03-10 MED ORDER — MIDAZOLAM HCL 2 MG/2ML IJ SOLN
INTRAMUSCULAR | Status: AC
  Filled 2023-03-10: qty 2

## 2023-03-10 MED ORDER — MEASLES, MUMPS & RUBELLA VAC IJ SOLR: 0.5000 mL | Freq: Once | INTRAMUSCULAR | Status: DC

## 2023-03-10 MED ORDER — FENTANYL CITRATE (PF) 100 MCG/2ML IJ SOLN: 25.0000 ug | INTRAMUSCULAR | Status: DC | PRN

## 2023-03-10 MED ORDER — ONDANSETRON HCL 4 MG/2ML IJ SOLN
INTRAMUSCULAR | Status: DC | PRN
  Administered 2023-03-10: 4 mg via INTRAVENOUS

## 2023-03-10 MED ORDER — TETANUS-DIPHTH-ACELL PERTUSSIS 5-2.5-18.5 LF-MCG/0.5 IM SUSY: 0.5000 mL | PREFILLED_SYRINGE | Freq: Once | INTRAMUSCULAR | Status: DC

## 2023-03-10 MED ORDER — LIDOCAINE-EPINEPHRINE (PF) 2 %-1:200000 IJ SOLN
INTRAMUSCULAR | Status: DC | PRN
  Administered 2023-03-10: 3 mL via EPIDURAL
  Administered 2023-03-10: 2 mL via EPIDURAL
  Administered 2023-03-10: 3 mL via EPIDURAL
  Administered 2023-03-10: 5 mL via EPIDURAL
  Administered 2023-03-10: 7 mL via EPIDURAL

## 2023-03-10 MED ORDER — ZOLPIDEM TARTRATE 5 MG PO TABS: 5.0000 mg | ORAL_TABLET | Freq: Every evening | ORAL | Status: DC | PRN

## 2023-03-10 MED ORDER — FENTANYL CITRATE (PF) 100 MCG/2ML IJ SOLN
INTRAMUSCULAR | Status: DC | PRN
  Administered 2023-03-10: 100 ug via EPIDURAL

## 2023-03-10 MED ORDER — ACETAMINOPHEN 325 MG PO TABS
650.0000 mg | ORAL_TABLET | ORAL | Status: DC | PRN
  Administered 2023-03-10: 650 mg via ORAL
  Filled 2023-03-10: qty 2

## 2023-03-10 MED ORDER — FENTANYL CITRATE (PF) 100 MCG/2ML IJ SOLN
INTRAMUSCULAR | Status: DC | PRN
  Administered 2023-03-10 (×2): 50 ug via INTRAVENOUS

## 2023-03-10 MED ORDER — FAMOTIDINE 20 MG PO TABS
40.0000 mg | ORAL_TABLET | Freq: Once | ORAL | Status: AC
  Administered 2023-03-10: 40 mg via ORAL
  Filled 2023-03-10: qty 2

## 2023-03-10 MED ORDER — DIPHENHYDRAMINE HCL 25 MG PO CAPS: 25.0000 mg | ORAL_CAPSULE | Freq: Four times a day (QID) | ORAL | Status: DC | PRN

## 2023-03-10 MED ORDER — SENNOSIDES-DOCUSATE SODIUM 8.6-50 MG PO TABS
2.0000 | ORAL_TABLET | Freq: Every day | ORAL | Status: DC
  Administered 2023-03-11: 2 via ORAL
  Filled 2023-03-10: qty 2

## 2023-03-10 MED ORDER — OXYCODONE HCL 5 MG/5ML PO SOLN: 5.0000 mg | Freq: Once | ORAL | Status: DC | PRN

## 2023-03-10 MED ORDER — ACETAMINOPHEN 10 MG/ML IV SOLN
INTRAVENOUS | Status: AC
  Filled 2023-03-10: qty 100

## 2023-03-10 MED ORDER — WITCH HAZEL-GLYCERIN EX PADS
1.0000 | MEDICATED_PAD | CUTANEOUS | Status: DC | PRN
  Administered 2023-03-10: 1 via TOPICAL

## 2023-03-10 MED ORDER — BUPIVACAINE HCL (PF) 0.25 % IJ SOLN
INTRAMUSCULAR | Status: AC
  Filled 2023-03-10: qty 10

## 2023-03-10 MED ORDER — DEXMEDETOMIDINE HCL IN NACL 80 MCG/20ML IV SOLN
INTRAVENOUS | Status: DC | PRN
  Administered 2023-03-10: 4 ug via INTRAVENOUS
  Administered 2023-03-10: 8 ug via INTRAVENOUS

## 2023-03-10 MED ORDER — SODIUM CHLORIDE 0.9% FLUSH
3.0000 mL | Freq: Two times a day (BID) | INTRAVENOUS | Status: DC
  Administered 2023-03-10 (×2): 3 mL via INTRAVENOUS

## 2023-03-10 MED ORDER — DEXAMETHASONE SODIUM PHOSPHATE 4 MG/ML IJ SOLN
INTRAMUSCULAR | Status: AC
  Filled 2023-03-10: qty 2

## 2023-03-10 MED ORDER — KETOROLAC TROMETHAMINE 30 MG/ML IJ SOLN
INTRAMUSCULAR | Status: AC
  Filled 2023-03-10: qty 1

## 2023-03-10 MED ORDER — MIDAZOLAM HCL 5 MG/5ML IJ SOLN
INTRAMUSCULAR | Status: DC | PRN
  Administered 2023-03-10 (×4): .5 mg via INTRAVENOUS

## 2023-03-10 MED ORDER — SIMETHICONE 80 MG PO CHEW: 80.0000 mg | CHEWABLE_TABLET | ORAL | Status: DC | PRN

## 2023-03-10 MED ORDER — FENTANYL CITRATE (PF) 100 MCG/2ML IJ SOLN
12.5000 ug | INTRAMUSCULAR | Status: DC | PRN
  Administered 2023-03-10: 12.5 ug via INTRAVENOUS
  Filled 2023-03-10: qty 2

## 2023-03-10 MED ORDER — BUPIVACAINE HCL (PF) 0.25 % IJ SOLN
INTRAMUSCULAR | Status: DC | PRN
  Administered 2023-03-10: 10 mL

## 2023-03-10 MED ORDER — BENZOCAINE-MENTHOL 20-0.5 % EX AERO: 1.0000 | INHALATION_SPRAY | CUTANEOUS | Status: DC | PRN

## 2023-03-10 MED ORDER — LACTATED RINGERS IV SOLN: INTRAVENOUS | Status: DC

## 2023-03-10 MED ORDER — ACETAMINOPHEN 325 MG PO TABS: 325.0000 mg | ORAL_TABLET | ORAL | Status: DC | PRN

## 2023-03-10 MED ORDER — BISACODYL 10 MG RE SUPP: 10.0000 mg | Freq: Every day | RECTAL | Status: DC | PRN

## 2023-03-10 MED ORDER — ONDANSETRON HCL 4 MG/2ML IJ SOLN: 4.0000 mg | INTRAMUSCULAR | Status: DC | PRN

## 2023-03-10 MED ORDER — COCONUT OIL OIL: 1.0000 | TOPICAL_OIL | Status: DC | PRN

## 2023-03-10 MED ORDER — SODIUM CHLORIDE 0.9 % IV SOLN: 250.0000 mL | INTRAVENOUS | Status: DC | PRN

## 2023-03-10 MED ORDER — ACETAMINOPHEN 325 MG PO TABS
650.0000 mg | ORAL_TABLET | Freq: Four times a day (QID) | ORAL | Status: DC
  Administered 2023-03-11 (×2): 650 mg via ORAL
  Filled 2023-03-10 (×2): qty 2

## 2023-03-10 MED ORDER — DIBUCAINE (PERIANAL) 1 % EX OINT: 1.0000 | TOPICAL_OINTMENT | CUTANEOUS | Status: DC | PRN

## 2023-03-10 MED ORDER — SODIUM BICARBONATE 8.4 % IV SOLN
INTRAVENOUS | Status: AC
  Filled 2023-03-10: qty 50

## 2023-03-10 MED ORDER — IBUPROFEN 600 MG PO TABS
600.0000 mg | ORAL_TABLET | Freq: Four times a day (QID) | ORAL | Status: DC
  Administered 2023-03-10 – 2023-03-11 (×4): 600 mg via ORAL
  Filled 2023-03-10 (×4): qty 1

## 2023-03-10 MED ORDER — PRENATAL MULTIVITAMIN CH
1.0000 | ORAL_TABLET | Freq: Every day | ORAL | Status: DC
  Administered 2023-03-11: 1 via ORAL
  Filled 2023-03-10: qty 1

## 2023-03-10 MED ORDER — DEXAMETHASONE SODIUM PHOSPHATE 4 MG/ML IJ SOLN
INTRAMUSCULAR | Status: DC | PRN
  Administered 2023-03-10: 8 mg via INTRAVENOUS

## 2023-03-10 SURGICAL SUPPLY — 21 items
DRSG OPSITE 4X5.5 SM (GAUZE/BANDAGES/DRESSINGS) IMPLANT
DRSG OPSITE POSTOP 3X4 (GAUZE/BANDAGES/DRESSINGS) ×1 IMPLANT
DURAPREP 26ML APPLICATOR (WOUND CARE) ×1 IMPLANT
GLOVE BIOGEL PI IND STRL 6.5 (GLOVE) ×1 IMPLANT
GLOVE BIOGEL PI IND STRL 7.0 (GLOVE) ×1 IMPLANT
GLOVE SURG SS PI 6.0 STRL IVOR (GLOVE) ×1 IMPLANT
GOWN STRL REUS W/TWL LRG LVL3 (GOWN DISPOSABLE) ×2 IMPLANT
NEEDLE HYPO 22GX1.5 SAFETY (NEEDLE) IMPLANT
NS IRRIG 1000ML POUR BTL (IV SOLUTION) ×1 IMPLANT
PACK ABDOMINAL MINOR (CUSTOM PROCEDURE TRAY) ×1 IMPLANT
PROTECTOR NERVE ULNAR (MISCELLANEOUS) ×1 IMPLANT
SPONGE GAUZE 2X2 8PLY STRL LF (GAUZE/BANDAGES/DRESSINGS) ×1 IMPLANT
SPONGE LAP 4X18 RFD (DISPOSABLE) IMPLANT
SUT PLAIN 0 NONE (SUTURE) ×1 IMPLANT
SUT VIC AB 0 CT1 27 (SUTURE) ×1
SUT VIC AB 0 CT1 27XBRD ANBCTR (SUTURE) ×1 IMPLANT
SUT VIC AB 3-0 PS2 18 (SUTURE) ×1 IMPLANT
SYR CONTROL 10ML LL (SYRINGE) IMPLANT
TOWEL OR 17X24 6PK STRL BLUE (TOWEL DISPOSABLE) ×2 IMPLANT
TRAY FOLEY CATH SILVER 14FR (SET/KITS/TRAYS/PACK) ×1 IMPLANT
WATER STERILE IRR 1000ML POUR (IV SOLUTION) ×1 IMPLANT

## 2023-03-10 NOTE — Anesthesia Preprocedure Evaluation (Addendum)
Anesthesia Evaluation  Patient identified by MRN, date of birth, ID band Patient awake    Reviewed: Allergy & Precautions, NPO status , Patient's Chart, lab work & pertinent test results  Airway Mallampati: II  TM Distance: >3 FB Neck ROM: Full    Dental  (+) Teeth Intact, Dental Advisory Given   Pulmonary asthma , former smoker   breath sounds clear to auscultation       Cardiovascular  Rhythm:Regular Rate:Normal     Neuro/Psych  PSYCHIATRIC DISORDERS Anxiety Depression    negative neurological ROS     GI/Hepatic Neg liver ROS,GERD  ,,  Endo/Other  negative endocrine ROS    Renal/GU negative Renal ROS     Musculoskeletal negative musculoskeletal ROS (+)    Abdominal   Peds  Hematology negative hematology ROS (+)   Anesthesia Other Findings   Reproductive/Obstetrics                             Anesthesia Physical Anesthesia Plan  ASA: 2  Anesthesia Plan: Epidural   Post-op Pain Management: Tylenol PO (pre-op)* and Toradol IV (intra-op)*   Induction: Intravenous  PONV Risk Score and Plan: 3 and Ondansetron, Dexamethasone and Midazolam  Airway Management Planned: Natural Airway and Nasal Cannula  Additional Equipment: None  Intra-op Plan:   Post-operative Plan:   Informed Consent: I have reviewed the patients History and Physical, chart, labs and discussed the procedure including the risks, benefits and alternatives for the proposed anesthesia with the patient or authorized representative who has indicated his/her understanding and acceptance.       Plan Discussed with: CRNA  Anesthesia Plan Comments:        Anesthesia Quick Evaluation

## 2023-03-10 NOTE — Lactation Note (Signed)
This note was copied from a baby's chart. Lactation Consultation Note  Patient Name: Tracy Guzman [id number] Date: 03/10/2023 Age:26 hours  Reason for consult: Initial assessment;Term;Exclusive pumping and bottle feeding  P4, 39.0 GA  Mother reports she wants to formula feed and give baby some colostrum. She does not want to latch baby. She reports that she did have milk production with her other babies and leaked "a lot" following delivery. Hand expression taught and colostrum flowed with ease. Taught mother how to collect colostrum and give by curved tip syringe with finger feeding or she can spoon feed. Mother wanted to use a manual pump in the hospital and will use her personal pump at home. It was noted that mother has bilateral inverted nipples and the manual pump everted the nipple, exposing the inner skin. Cautioned mother to pump gently and avoid trauma to her nipples. Mother preferred hand expression.Praised for wanting to give her daughter colostrum.   Encouraged to ask nursing staff or [facility] for assistance, as needed. Mom made aware of O/P services, breastfeeding support groups, and our phone # for post-discharge questions.     Maternal Data Has patient been taught Hand Expression?: Yes Does the patient have breastfeeding experience prior to this delivery?: No  Feeding Mother's Current Feeding Choice: Breast Milk and Formula Nipple Type: Slow - flow   Lactation Tools Discussed/Used Tools: Pump Breast pump type: Manual Pump Education: Setup, frequency, and cleaning;Milk Storage Reason for Pumping: mother wants to give baby colostrum Pumping frequency: PRN  Interventions Interventions: Hand express;Hand pump;LC Services brochure;Education  Discharge Pump: DEBP;Personal;Manual;Hands Free  Consult Status Consult Status: PRN    Omar Person 03/10/2023, 4:59 PM

## 2023-03-10 NOTE — Transfer of Care (Signed)
Immediate Anesthesia Transfer of Care Note  Patient: [name]  Procedure(s) Performed: POST PARTUM TUBAL LIGATION  Patient Location: PACU  Anesthesia Type:Epidural  Level of Consciousness: awake, alert , and oriented  Airway & Oxygen Therapy: Patient Spontanous Breathing  Post-op Assessment: Report given to RN and Post -op Vital signs reviewed and stable  Post vital signs: Reviewed and stable  Last Vitals:  Vitals Value Taken Time  BP 95/60 03/10/23 1104  Temp 37.1 C 03/10/23 1104  Pulse 70 03/10/23 1108  Resp 12 03/10/23 1108  SpO2 96 % 03/10/23 1108  Vitals shown include unvalidated device data.  Last Pain:  Vitals:   03/10/23 1104  TempSrc: Oral  PainSc: Asleep         Complications: No notable events documented.

## 2023-03-10 NOTE — Discharge Summary (Addendum)
Postpartum Discharge Summary  Date of Service updated 03/11/23     Patient Name: Tracy Guzman DOB: 18-Mar-1996 MRN: [id number]  Date of admission: 03/09/2023 Delivery date:03/10/2023  Delivering provider: Shawna Clamp R  Date of discharge: 03/11/2023  Admitting diagnosis: Indication for care in labor or delivery [O75.9] Post-dates pregnancy [O48.0] Intrauterine pregnancy: [redacted]w[redacted]d     Secondary diagnosis:  Principal Problem:   Indication for care in labor or delivery Active Problems:   Supervision of other normal pregnancy, antepartum   Rubella non-immune status, antepartum   Post-dates pregnancy  Additional problems: None   Discharge diagnosis: Term Pregnancy Delivered                                              Post partum procedures:postpartum tubal ligation Augmentation: AROM and Pitocin Complications: suspected partial marginal abruption  Hospital course: Onset of Labor With Vaginal Delivery      27 y.o. yo [phone] at [redacted]w[redacted]d was admitted in Latent Labor on 03/09/2023. Labor course was complicated by increased bleeding.  Membrane Rupture Time/Date: 1:06 AM ,03/10/2023   Delivery Method:Vaginal, Spontaneous  Episiotomy: None  Lacerations:  None  Patient had uneventful post-partum course. She is ambulating, tolerating a regular diet, passing flatus, and urinating well. Patient is discharged home in stable condition on 03/11/23.  Newborn Data: Birth date:03/10/2023  Birth time:5:01 AM  Gender:Female  Living status:Living  Apgars:9 ,9  Weight:3150 g   Magnesium Sulfate received: No BMZ received: No Rhophylac:N/A MMR:N/A T-DaP:Given prenatally Flu: N/A Transfusion:No  Physical exam  Vitals:   03/10/23 1740 03/10/23 1949 03/10/23 2321 03/11/23 0527  BP: 121/60 107/61 102/65 111/66  Pulse: 83 68 67 68  Resp: 16 17 16 16   Temp: 97.8 F (36.6 C) 97.6 F (36.4 C) 97.9 F (36.6 C) 98.2 F (36.8 C)  TempSrc: Oral Oral Oral Oral  SpO2: 99% 99% 98% 100%   General:  alert, cooperative, and no distress Lochia: appropriate Uterine Fundus: firm Incision: Healing well with no significant drainage DVT Evaluation: No evidence of DVT seen on physical exam. Labs: Lab Results  Component Value Date   WBC 6.3 03/09/2023   HGB 11.1 (L) 03/09/2023   HCT 33.5 (L) 03/09/2023   MCV 81.1 03/09/2023   PLT 149 (L) 03/09/2023      Latest Ref Rng & Units 04/13/2021    6:12 PM  CMP  Glucose 70 - 99 mg/dL 324   BUN 6 - 20 mg/dL 5   Creatinine 4.01 - 0.27 mg/dL 2.53   Sodium 664 - 403 mmol/L 135   Potassium 3.5 - 5.1 mmol/L 3.7   Chloride 98 - 111 mmol/L 105   CO2 22 - 32 mmol/L 21   Calcium 8.9 - 10.3 mg/dL 9.0   Total Protein 6.5 - 8.1 g/dL 6.1   Total Bilirubin 0.3 - 1.2 mg/dL 1.0   Alkaline Phos 38 - 126 U/L 137   AST 15 - 41 U/L 25   ALT 0 - 44 U/L 20    Edinburgh Score:    03/10/2023    5:36 PM  Edinburgh Postnatal Depression Scale Screening Tool  I have been able to laugh and see the funny side of things. 0  I have looked forward with enjoyment to things. 0  I have blamed myself unnecessarily when things went wrong. 0  I have been anxious or worried for no good  reason. 0  I have felt scared or panicky for no good reason. 0  Things have been getting on top of me. 0  I have been so unhappy that I have had difficulty sleeping. 0  I have felt sad or miserable. 0  I have been so unhappy that I have been crying. 0  The thought of harming myself has occurred to me. 0  Edinburgh Postnatal Depression Scale Total 0     After visit meds:  Allergies as of 03/11/2023       Reactions   Lamotrigine Rash        Medication List     STOP taking these medications    albuterol 108 (90 Base) MCG/ACT inhaler Commonly known as: VENTOLIN HFA   Blood Pressure Kit Devi   Blood Pressure Monitor Devi   cyclobenzaprine 10 MG tablet Commonly known as: FLEXERIL   fluticasone 44 MCG/ACT inhaler Commonly known as: FLOVENT HFA   ondansetron 4 MG  disintegrating tablet Commonly known as: ZOFRAN-ODT   Prenatal 28-0.8 MG Tabs   terconazole 0.8 % vaginal cream Commonly known as: TERAZOL 3       TAKE these medications    acetaminophen 325 MG tablet Commonly known as: Tylenol Take 2 tablets (650 mg total) by mouth every 6 (six) hours.   benzocaine-Menthol 20-0.5 % Aero Commonly known as: DERMOPLAST Apply 1 Application topically as needed (perineal discomfort).   ibuprofen 600 MG tablet Commonly known as: ADVIL Take 1 tablet (600 mg total) by mouth every 6 (six) hours for 30 doses.   metroNIDAZOLE 500 MG tablet Commonly known as: Flagyl Take 1 tablet (500 mg total) by mouth 2 (two) times daily for 4 days.   senna-docusate 8.6-50 MG tablet Commonly known as: Senokot-S Take 2 tablets by mouth daily.        Discharge home in stable condition Infant Feeding: Bottle and Breast Infant Disposition:home with mother Discharge instruction: per After Visit Summary and Postpartum booklet. Activity: Advance as tolerated. Pelvic rest for 6 weeks.  Diet: routine diet Future Appointments: Future Appointments  Date Time Provider Department Center  05/06/2023  8:35 AM Katrinka Blazing, [place], CNM [facility]   Follow up Visit: Cheral Marker, CNM  P [facility] Admin Pool Please schedule this patient for PP visit in: 4-6wks Low risk pregnancy complicated by: nothing Delivery mode:  SVD Anticipated Birth Control:  plans BTL in hospital PP Procedures needed: none Schedule Integrated BH visit: yes Provider: Any provider  Laurie Panda, MD PGY-3 Visiting Resident ___ GME ATTESTATION:  Evaluation and management procedures were performed by the [facility] Medicine Resident under my supervision. I was immediately available for direct supervision, assistance and direction throughout this encounter.  I also confirm that I have verified the information documented in the resident's note, and that I have also personally reperformed the pertinent  components of the physical exam and all of the medical decision making activities.  I have also made any necessary editorial changes.  Myrtie Hawk, DO OB Fellow, Faculty [facility], Center for [facility] Healthcare 03/11/2023 8:02 AM

## 2023-03-10 NOTE — Progress Notes (Signed)
Patient ID: Tracy Guzman, female   DOB: 1996-08-02, 27 y.o.   MRN: [id number] LABOR NOTE Ayli Sarduy is a 27 y.o. [phone] at [redacted]w[redacted]d admitted for early labor  Subjective: no complaints and comfortable with epidural  Objective: BP 103/62   Pulse 72   Temp 98.6 F (37 C) (Oral)   Resp 16   LMP 06/10/2022   SpO2 99%  Total I/O In: -  Out: 1175 [Urine:1175]  FHR baseline 125 bpm, Variability: moderate, Accelerations:present, Decelerations:  Absent Toco: q 2-4 mins   SVE:   Dilation: 6.5 Effacement (%): 70 Station: -2, -3 Exam by:: Joellyn Haff CNM AROM mod amt clear fluid Bleeding stable  Pitocin @ 14 mu/min  Labs: Lab Results  Component Value Date   WBC 6.3 03/09/2023   HGB 11.1 (L) 03/09/2023   HCT 33.5 (L) 03/09/2023   MCV 81.1 03/09/2023   PLT 149 (L) 03/09/2023    Assessment / Plan: Admitted in early labor, started on pit agumentation @ 1830, currently at 14, now AROM'd. Heavier bleeding/clots earlier, suspicious for partial marginal abruption, stable currently. Updated Dr. Shawnie Pons   Labor: active Fetal Wellbeing:  Category I Pain Control:  epidural Pre-eclampsia: N/A I/D:  GBS neg Anticipated MOD: NSVB  Cheral Marker CNM, WHNP-BC 03/10/2023, 1:20 AM

## 2023-03-10 NOTE — Anesthesia Postprocedure Evaluation (Signed)
Anesthesia Post Note  Patient: Pharmacist, community  Procedure(s) Performed: POST PARTUM TUBAL LIGATION     Patient location during evaluation: Mother Baby Anesthesia Type: Epidural Level of consciousness: awake, awake and alert and oriented Pain management: pain level controlled Vital Signs Assessment: post-procedure vital signs reviewed and stable Respiratory status: spontaneous breathing, nonlabored ventilation and respiratory function stable Cardiovascular status: blood pressure returned to baseline and stable Postop Assessment: no headache, no backache, patient able to bend at knees, no apparent nausea or vomiting, able to ambulate, adequate PO intake and epidural receding Anesthetic complications: no   No notable events documented.  Last Vitals:  Vitals:   03/10/23 1445 03/10/23 1740  BP: 111/78 121/60  Pulse: 73 83  Resp: 18 16  Temp: 36.9 C 36.6 C  SpO2: 96% 99%    Last Pain:  Vitals:   03/10/23 1740  TempSrc: Oral  PainSc:    Pain Goal:                   Zohaib Heeney

## 2023-03-10 NOTE — Progress Notes (Signed)
26 y.o. yo [id number]  status post vaginal delivery who desires permanent sterilization. Risks and benefits of procedure discussed with patient including permanence of method, bleeding, infection, injury to surrounding organs and need for additional procedures. Risk failure of 0.5-1% with increased risk of ectopic gestation if pregnancy occurs was also discussed with patient. Patient consented for bilateral salpingectomy

## 2023-03-10 NOTE — Anesthesia Postprocedure Evaluation (Signed)
Anesthesia Post Note  Patient: Pharmacist, community  Procedure(s) Performed: AN AD HOC LABOR EPIDURAL     Patient location during evaluation: Mother Baby Anesthesia Type: Epidural Level of consciousness: awake and alert Pain management: pain level controlled Vital Signs Assessment: post-procedure vital signs reviewed and stable Respiratory status: spontaneous breathing, nonlabored ventilation and respiratory function stable Cardiovascular status: stable Postop Assessment: no headache, no backache and epidural receding Anesthetic complications: no   No notable events documented.  Last Vitals:  Vitals:   03/10/23 0550 03/10/23 0601  BP: 114/67 107/71  Pulse: 73 (!) 148  Resp:    Temp:    SpO2:      Last Pain:  Vitals:   03/10/23 0315  TempSrc:   PainSc: 7    Pain Goal:                Epidural/Spinal Function Cutaneous sensation: Able to Discern Pressure (03/10/23 0530), Patient able to flex knees: Yes (03/10/23 0530), Patient able to lift hips off bed: Yes (03/10/23 0530), Back pain beyond tenderness at insertion site: No (03/10/23 0530), Progressively worsening motor and/or sensory loss: No (03/10/23 0530), Bowel and/or bladder incontinence post epidural: No (03/10/23 0530)  Telesha Deguzman

## 2023-03-10 NOTE — Op Note (Signed)
NAME@ 03/09/2023 - 03/10/2023  PREOPERATIVE DIAGNOSIS:  Undesired fertility  POSTOPERATIVE DIAGNOSIS:  Undesired fertility  PROCEDURE:  Postpartum Bilateral salpingectomy ANESTHESIA:  Epidural  COMPLICATIONS:  None immediate.  ESTIMATED BLOOD LOSS:  Less than 20cc.  FLUIDS: 1000 cc LR.   INDICATIONS: 27 y.o. yo [phone]  with undesired fertility,status post vaginal delivery, desires permanent sterilization. Risks and benefits of procedure discussed with patient including permanence of method, bleeding, infection, injury to surrounding organs and need for additional procedures. Risk failure of 0.5-1% with increased risk of ectopic gestation if pregnancy occurs was also discussed with patient.   FINDINGS:  Normal uterus, tubes, and ovaries.  TECHNIQUE: After informed consent was obtained, the patient was taken to the operating room where anesthesia was induced and found to be adequate. A small transverse, infraumbilical skin incision was made with the scalpel. This incision was carried down to the underlying layer of fascia. The fascia was grasped with Kocher clamps tented up and entered sharply with Mayo scissors. Underlying peritoneum was then identified tented up and entered sharply with Metzenbaum scissors. The fascia was tagged with 0 Vicryl. The patient's left fallopian tube was then identified, brought to the incision, and grasped with a Babcock clamp. The tube was then followed out to the fimbria. The Babcock clamp was then used to grasp the tube from the fimbria region to the proximity of the cornual region. This segment was doubly clamped with Kelly clamps, transected and suture ligated x 2. Good hemostasis was noted. The right fallopian tube was then identified to its fimbriated end, clamped in a similar fashion, transected and ligated. Excellent hemostasis was noted. The fascia was re-approximated with 0 Vicryl. The skin was closed in a subcuticular fashion with 3-0 Vicryl. Quarter percent  Marcaine solution was then injected at the incision site. The patient tolerated the procedure well. Sponge, lap, and needle count were correct x2. The patient was taken to recovery room in stable condition.

## 2023-03-11 ENCOUNTER — Encounter (HOSPITAL_COMMUNITY): Payer: Self-pay | Admitting: Obstetrics and Gynecology

## 2023-03-11 MED ORDER — SENNOSIDES-DOCUSATE SODIUM 8.6-50 MG PO TABS: 2.0000 | ORAL_TABLET | Freq: Every day | ORAL | 0 refills | Status: DC

## 2023-03-11 MED ORDER — IBUPROFEN 600 MG PO TABS: 600.0000 mg | ORAL_TABLET | Freq: Four times a day (QID) | ORAL | 0 refills | Status: AC

## 2023-03-11 MED ORDER — BENZOCAINE-MENTHOL 20-0.5 % EX AERO: 1.0000 | INHALATION_SPRAY | CUTANEOUS | 0 refills | Status: DC | PRN

## 2023-03-11 MED ORDER — ACETAMINOPHEN 325 MG PO TABS: 650.0000 mg | ORAL_TABLET | Freq: Four times a day (QID) | ORAL | 0 refills | Status: AC

## 2023-03-11 MED ORDER — METRONIDAZOLE 500 MG PO TABS: 500.0000 mg | ORAL_TABLET | Freq: Two times a day (BID) | ORAL | 0 refills | Status: AC

## 2023-03-11 MED ORDER — OXYCODONE HCL 5 MG PO TABS: 5.0000 mg | ORAL_TABLET | Freq: Four times a day (QID) | ORAL | 0 refills | Status: DC | PRN

## 2023-03-11 NOTE — Lactation Note (Signed)
This note was copied from a baby's chart. Lactation Consultation Note  Patient Name: Tracy Guzman [id number] Date: 03/11/2023 Age:26 hours  Reason for consult: Follow-up assessment  P4, 39.0 GA, 2% weight loss  Infant is formula feeding with bottle and tolerating well. Mother reports she has given approximately 30 ml of colostrum over the last several hours. She has been hand expressing and feeding baby by curved tip syringe along with formula. Mother has bilateral inverted nipples and states she would like to try to latch with a nipple shield. She has attempted the NS in the past, but her babies got frustrated and she stopped.  Baby recently formula fed and sleeping. Mother was fitted with a 24 mm NS and mother was able to return demonstration. Her nipple did extend into the NS and she reports understanding of use. Reviewed signs of good latch with NS and how to determine if milk transfer. Mother is familiar with the NS as she has attempted to use with other babies.   Instructed if she uses the NS, would recommend a OP Lactation feeding assessment to verify good milk transfer. Mother has a personal breast pump. She has a positive  history of milk production and aware of how to manage engorgement.       Feeding Mother's Current Feeding Choice: Breast Milk and Formula   Lactation Tools Discussed/Used Tools: Nipple Shields Nipple shield size: 24 Breast pump type: Manual    Discharge Discharge Education: Engorgement and breast care;Warning signs for feeding baby  Consult Status Consult Status: Complete Date: 03/11/23    Omar Person 03/11/2023, 11:21 AM

## 2023-03-11 NOTE — Progress Notes (Signed)
CSW received a consult for anxiety and depression. CSW met [name] at bedside to complete a mental health assessment. CSW entered the room, introduced herself and explained the reason for the visit. [name] was polite, easy to engage, receptive to meeting with CSW, and appeared forthcoming.   CSW asked [name] was she still living at [facility] at inn. [name] reported she has just signed her lease for her new apartment; and she has graduated from there program so she will not be returning; CSW congratulated [name] on her accomplishments.  CSW asked [name] about any CPS history regarding her other children. [name] reported her two oldest children Copy (06-01-2015) and Cherylann Ratel (04-23-16) were involved with a case regarding their dad with DV, [name]'s mental health and Jacobi's health conditions. [name] reported they are currently living with a TSP that is there grandmother Governor Specking since 2020. [name] reported she still has custody of the her kids and sees them every other weekend. CSW made a report to [facility] regarding [name]'s CPS history.   CSW inquired about [name]'s mental health history. [name] reported being diagnosed with GAD in the past; and currently participating in therapy every 2 weeks with [facility]. [name] reported once discharge she will begin seeing natalie on a weekly basis in preparation for the PP period. [name] reported PPD after her 2nd pregnancy due to infant being in the  NICU for over 100 days. [name] reported currently feeling good and bonded well with infant. CSW provided education regarding the baby blues period vs. perinatal mood disorders, discussed treatment and gave resources for mental health follow up if concerns arise.  CSW recommends self-evaluation during the postpartum time period using the New Mom Checklist from Postpartum Progress and encouraged [name] to contact a medical professional if symptoms are noted at any time. CSW assessed for safety with [name]  SI/HI/DV;[name] denied all.   CSW asked [name] has she selected a pediatrician for infant follow up visits; [facility] - [place]. [name] reported having all essential items for infant including a carseat and bassinet for safe sleeping.   CSW identifies no further need for intervention and no barriers to discharge at this time.  Enos Fling, Theresia Majors Clinical Social Worker [phone]

## 2023-03-12 LAB — SURGICAL PATHOLOGY

## 2023-03-15 ENCOUNTER — Encounter: Payer: Self-pay | Admitting: Obstetrics and Gynecology

## 2023-03-18 ENCOUNTER — Ambulatory Visit: Payer: Medicaid Other

## 2023-03-24 ENCOUNTER — Inpatient Hospital Stay (HOSPITAL_COMMUNITY): Admission: AD | Admit: 2023-03-24 | Payer: Medicaid Other | Source: Home / Self Care | Admitting: Family Medicine

## 2023-03-24 ENCOUNTER — Inpatient Hospital Stay (HOSPITAL_COMMUNITY): Payer: Medicaid Other

## 2023-04-05 ENCOUNTER — Telehealth (HOSPITAL_COMMUNITY): Payer: Self-pay | Admitting: *Deleted

## 2023-04-05 NOTE — Telephone Encounter (Signed)
04/05/2023  Name: Tracy Guzman MRN: [id number] DOB: 08-05-1996  Reason for Call:  Transition of Care Hospital Discharge Call  Contact Status: Patient Contact Status: Complete Call was dropped. RN returned call. No answer received. Language assistant needed:          Follow-Up Questions: Do You Have Any Concerns About Your Health As You Heal From Delivery?: No Do You Have Any Concerns About Your Infants Health?: No  Edinburgh Postnatal Depression Scale:  In the Past 7 Days:   EPDS not completed at this time. Call was dropped when RN began EPDS. No answer on return call. PHQ2-9 Depression Scale:     Discharge Follow-up: Edinburgh score requires follow up?: N/A  Post-discharge interventions: Reviewed Newborn Safe Sleep Practices  Signature Deforest Hoyles, RN, [phone]

## 2023-05-06 ENCOUNTER — Ambulatory Visit: Payer: Medicaid Other | Admitting: Advanced Practice Midwife

## 2023-07-10 ENCOUNTER — Ambulatory Visit: Payer: Self-pay

## 2023-07-15 ENCOUNTER — Ambulatory Visit: Payer: Medicaid Other

## 2023-07-15 ENCOUNTER — Ambulatory Visit
Admission: RE | Admit: 2023-07-15 | Discharge: 2023-07-15 | Disposition: A | Discharge status: Home / Self Care | Payer: Medicaid Other | Source: Ambulatory Visit

## 2023-07-15 VITALS — BP 110/72 | HR 87 | Temp 98.7°F | Resp 16

## 2023-07-15 DIAGNOSIS — M25571 Pain in right ankle and joints of right foot: Secondary | ICD-10-CM

## 2023-07-15 DIAGNOSIS — S93401A Sprain of unspecified ligament of right ankle, initial encounter: Secondary | ICD-10-CM

## 2023-07-15 MED ORDER — IBUPROFEN 800 MG PO TABS: 800.0000 mg | ORAL_TABLET | Freq: Three times a day (TID) | ORAL | 0 refills | Status: DC | PRN

## 2023-07-15 MED ORDER — KETOROLAC TROMETHAMINE 30 MG/ML IJ SOLN
30.0000 mg | Freq: Once | INTRAMUSCULAR | Status: AC
  Administered 2023-07-15: 30 mg via INTRAMUSCULAR

## 2023-07-15 NOTE — Discharge Instructions (Addendum)
Your x-rays showed no broken bones or dislocations. They were sent to a radiologist for further evaluation and we will call you if the radiologist sees anything different.   Please wear the splint at all times for the next week. I recommend follow up with Orthopedics given how long the pain has been ongoing. Recommend rest, ice, compression, and elevation. You can alternate Tylenol with the Ibuprofen I sent to your pharmacy. You should not combine any other NSAIDs (aleve, advil, ibuprofen, motrin) with the ibuprofen you were prescribed.  You were given an injection (Toradol) for your ankle today. This will help with the pain and inflammation. Do NOT take any other NSAIDs today given your injection.   Return in 2 to 3 days if no improvement. It is very important for you to pay attention to any new symptoms or worsening of your current condition. Please go directly to the Emergency Department immediately should you begin to feel worse in any way or have any of the following symptoms: increased pain, increased swelling, color changes, increased numbness/tingling, shortness of breath, or chest pain.

## 2023-07-15 NOTE — ED Triage Notes (Signed)
Pt states she rolled her right ankle about one week ago and pain has been worsening, bruising is decreasing, swelling persists.

## 2023-07-15 NOTE — ED Provider Notes (Signed)
[facility]  Note:  This document was prepared using Dragon voice recognition software and may include unintentional dictation errors.  MRN: [id number] DOB: 1995-09-16 DATE: 07/15/23   Subjective:  Chief Complaint:  Chief Complaint  Patient presents with   Foot Injury    Ankle injury, possible fracture. - Entered by patient    HPI: Tracy Guzman is a 27 y.o. female presenting for right ankle pain for one week. Patient states that she tripped over her child's toy at home and rolled her ankle about a week ago. Denies hitting her head or LOC. She states that for the first two days she did not have pain and then suddenly started having right ankle pain this week. She reports no additional injuries following the initial incident. She has been taking ibuprofen with no relief. Pain worse with walking and weightbearing. No prior injuries or fractures to the ankle. Denies fever, nausea/vomiting, SOB, chest pain, numbness. Endorses right ankle pain, edema, bruising. Presents NAD.  Prior to Admission medications   Medication Sig Start Date End Date Taking? Authorizing Provider  escitalopram (LEXAPRO) 10 MG tablet Take 5 mg by mouth daily. 07/10/23  Yes [provider]  propranolol (INDERAL) 10 MG tablet Take 10 mg by mouth 3 (three) times daily as needed. 07/10/23  Yes [provider]  mometasone-formoterol (DULERA) 100-5 MCG/ACT AERO Inhale 2 puffs into the lungs 2 (two) times daily.  04/18/20  [provider]     Allergies  Allergen Reactions   Lamotrigine Rash    History:   Past Medical History:  Diagnosis Date   Abnormal prenatal ultrasound 03/12/2016   Allergy    Smoke, dust, Pollen   Anxiety 08/18/2017   Asthma    Depression    Drug use complicating pregnancy in third trimester 03/12/2016   Endometriosis    GERD (gastroesophageal reflux disease)    Oppositional defiant disorder 06/06/2012   Polysubstance abuse (HCC) 02/12/2012   Prenatal  consult 01/20/2016   Formatting of this note might be different from the original. Suspect ASD.  Fine to deliver in [place] and come see me in clinic a few weeks after delivery. Formatting of this note might be different from the original. Suspect ASD.  Fine to deliver in [place] and come see me in clinic a few weeks after delivery.   Right lower quadrant pain 08/13/2011   Seasonal allergies 12/11/2014   Suicidal ideation 02/12/2012   Tobacco smoking complicating pregnancy in third trimester 03/12/2016     Past Surgical History:  Procedure Laterality Date   ADENOIDECTOMY     MYRINGOTOMY     TONSILLECTOMY AND ADENOIDECTOMY  as infant   TUBAL LIGATION N/A 03/10/2023   Procedure: POST PARTUM TUBAL LIGATION;  Surgeon: Catalina Antigua, MD;  Location: [facility];  Service: Gynecology;  Laterality: N/A;   WRIST SURGERY  age 79    Family History  Problem Relation Age of Onset   Cancer Maternal Grandmother    Depression Maternal Grandmother    Colon cancer Maternal Grandfather    Hypertension Father    Hyperlipidemia Father    Hypertension Mother    Depression Mother    Hyperlipidemia Mother    Drug abuse Maternal Uncle    Drug abuse Cousin     Social History   Tobacco Use   Smoking status: Former    Current packs/day: 0.00    Average packs/day: 0.3 packs/day for 1 year (0.3 ttl pk-yrs)    Types: Cigarettes    Start  date: 08/14/2021    Quit date: 08/14/2022    Years since quitting: 0.9   Smokeless tobacco: Never  Vaping Use   Vaping status: Never Used  Substance Use Topics   Alcohol use: Not Currently    Comment: not since confirmed pregnancy   Drug use: Not Currently    Types: Other-see comments, MDMA (Ecstacy), Marijuana    Comment: last used end of December    Review of Systems  Constitutional:  Negative for fever.  Respiratory:  Negative for shortness of breath.   Cardiovascular:  Negative for chest pain.  Gastrointestinal:  Negative for nausea and vomiting.   Musculoskeletal:  Positive for arthralgias.  Neurological:  Negative for numbness.     Objective:   Vitals: BP 110/72 (BP Location: Right Arm)   Pulse 87   Temp 98.7 F (37.1 C) (Oral)   Resp 16   LMP 07/11/2023 (Exact Date)   SpO2 97%   Breastfeeding No   Physical Exam Constitutional:      General: She is not in acute distress.    Appearance: Normal appearance. She is well-developed and normal weight. She is not ill-appearing or toxic-appearing.     Comments: Patient presented in a wheelchair  HENT:     Head: Normocephalic and atraumatic.  Cardiovascular:     Rate and Rhythm: Normal rate and regular rhythm.     Pulses:          Dorsalis pedis pulses are 2+ on the right side and 2+ on the left side.     Heart sounds: Normal heart sounds.  Pulmonary:     Effort: Pulmonary effort is normal.     Breath sounds: Normal breath sounds.     Comments: Clear to auscultation bilaterally  Abdominal:     General: Bowel sounds are normal.     Palpations: Abdomen is soft.     Tenderness: There is no abdominal tenderness.  Musculoskeletal:     Right lower leg: Edema present.     Right ankle: Swelling and ecchymosis present. Tenderness present over the medial malleolus. Decreased range of motion. Normal pulse.     Left ankle: Normal.     Right foot: Normal.     Left foot: Normal.     Comments: Decreased ROM in right ankle due to pain with walking and weightbearing.  Tenderness palpation along right medial malleolus.  Bruising present.  Nonpitting edema overlying right medial and lateral malleolus.  Neurovascular intact.  No warmth, erythema, discharge.  Skin:    General: Skin is warm and dry.     Findings: Bruising and ecchymosis present.  Neurological:     General: No focal deficit present.     Mental Status: She is alert.  Psychiatric:        Mood and Affect: Mood and affect normal.     Results:  Labs: No results found for this or any previous visit (from the past 24  hour(s)).  Radiology: No results found.   UC Course/Treatments:  Procedures: Procedures   Medications Ordered in UC: Medications  ketorolac (TORADOL) 30 MG/ML injection 30 mg (has no administration in time range)     Assessment and Plan :     ICD-10-CM   1. Sprain of right ankle, unspecified ligament, initial encounter  S93.401A     2. Acute right ankle pain  M25.571      Acute right ankle pain Afebrile, nontoxic-appearing, NAD. VSS. DDX includes but not limited to: fracture, dislocation, sprain, contusion Imaging was  negative for fracture or dislocation today in office. Suspect sprain. Ibuprofen 800mg  every 8 hours PRN was prescribed to help with pain and inflammation. She was placed in a boot at today's visit and instructed to follow up with orthopedics given ongoing symptoms. Toradol 30mg  IM was given today in office. Strict ED precautions were given and patient verbalized understanding.  ED Discharge Orders          Ordered    ibuprofen (ADVIL) 800 MG tablet  Every 8 hours PRN        07/15/23 0927             I have reviewed the PDMP during this encounter.     Cynda Acres, PA-C 07/15/23 0932

## 2023-09-28 ENCOUNTER — Ambulatory Visit
Admission: EM | Admit: 2023-09-28 | Discharge: 2023-09-28 | Disposition: A | Discharge status: Home / Self Care | Payer: Medicaid Other | Attending: Internal Medicine | Admitting: Internal Medicine

## 2023-09-28 ENCOUNTER — Encounter: Payer: Self-pay | Admitting: *Deleted

## 2023-09-28 DIAGNOSIS — N898 Other specified noninflammatory disorders of vagina: Secondary | ICD-10-CM | POA: Diagnosis present

## 2023-09-28 DIAGNOSIS — R21 Rash and other nonspecific skin eruption: Secondary | ICD-10-CM | POA: Diagnosis present

## 2023-09-28 MED ORDER — METRONIDAZOLE 500 MG PO TABS: 500.0000 mg | ORAL_TABLET | Freq: Two times a day (BID) | ORAL | 0 refills | Status: AC

## 2023-09-28 MED ORDER — TRIAMCINOLONE ACETONIDE 0.1 % EX CREA: 1.0000 | TOPICAL_CREAM | Freq: Two times a day (BID) | CUTANEOUS | 0 refills | Status: AC

## 2023-09-28 NOTE — ED Triage Notes (Addendum)
C/O vaginal odor onset approx 4-5 days ago, but denies vaginal discharge. Wishes to be tested for STIs. Also c/o vaginal bleeding after intercourse. C/O non-pruritic fine bumpy rash to RUE onset this AM.

## 2023-09-28 NOTE — Discharge Instructions (Signed)
Your swab was sent to the lab for further testing.  You will be called with results. Flagyl is an antibiotic given to treat vaginal infections. Take the prescription as directed.  You should avoid all sexual activity until you have been notified of all your results and have undergone any necessary treatment.  If you are positive, it is recommended that you inform all sexual partners so they can treat be treated as well before having sex again.

## 2023-09-28 NOTE — ED Provider Notes (Signed)
[facility]  Note:  This document was prepared using Dragon voice recognition software and may include unintentional dictation errors.  MRN: [id number] DOB: [birth year] DATE: 09/28/23   Subjective:  Chief Complaint:  Chief Complaint  Patient presents with   STI Testing   Rash     HPI: Tracy Guzman is a 28 y.o. female presenting for vaginal discharge and rash. First, patient reports increase in vaginal discharge and slight odor for the past 4-5 days. She states that the discharge is more watery. She became concerned when in the past 2-3 days she has had slight bleeding vaginal after sex. Reports no pain and only have a small amount of bleeding. Reports no sexual partner and they recently began having unprotected sex.  Denies vaginal lesions, dysuria, hematuria.  Endorses vaginal discharge, odor.  Patient also presenting for rash on her right arm at anterior elbow.  She states she just noticed the rash this morning.  She states that the rash does not itch or burn.  She reports no similar rashes in the past.  No new medications, foods, detergents, lotions, or washes. Denies fever, nausea/vomiting, abdominal pain, pruritus, discharge. Endorses rash. Presents NAD.  Prior to Admission medications   Medication Sig Start Date End Date Taking? Authorizing Provider  escitalopram (LEXAPRO) 10 MG tablet Take 5 mg by mouth daily. 07/10/23  Yes [provider]  propranolol (INDERAL) 10 MG tablet Take 10 mg by mouth 3 (three) times daily as needed. 07/10/23  Yes [provider]  mometasone-formoterol (DULERA) 100-5 MCG/ACT AERO Inhale 2 puffs into the lungs 2 (two) times daily.  04/18/20  [provider]     Allergies  Allergen Reactions   Lamotrigine Rash    History:   Past Medical History:  Diagnosis Date   Abnormal prenatal ultrasound 03/12/2016   Allergy    Smoke, dust, Pollen   Anxiety 08/18/2017   Asthma    Depression    Drug use complicating  pregnancy in third trimester 03/12/2016   Endometriosis    GERD (gastroesophageal reflux disease)    Oppositional defiant disorder 06/06/2012   Polysubstance abuse (HCC) 02/12/2012   Prenatal consult 01/20/2016   Formatting of this note might be different from the original. Suspect ASD.  Fine to deliver in [place] and come see me in clinic a few weeks after delivery. Formatting of this note might be different from the original. Suspect ASD.  Fine to deliver in [place] and come see me in clinic a few weeks after delivery.   Right lower quadrant pain 08/13/2011   Seasonal allergies 12/11/2014   Suicidal ideation 02/12/2012   Tobacco smoking complicating pregnancy in third trimester 03/12/2016     Past Surgical History:  Procedure Laterality Date   ADENOIDECTOMY     MYRINGOTOMY     TONSILLECTOMY AND ADENOIDECTOMY  as infant   TUBAL LIGATION N/A 03/10/2023   Procedure: POST PARTUM TUBAL LIGATION;  Surgeon: Catalina Antigua, MD;  Location: [facility];  Service: Gynecology;  Laterality: N/A;   WRIST SURGERY  age 28    Family History  Problem Relation Age of Onset   Cancer Maternal Grandmother    Depression Maternal Grandmother    Colon cancer Maternal Grandfather    Hypertension Father    Hyperlipidemia Father    Hypertension Mother    Depression Mother    Hyperlipidemia Mother    Drug abuse Maternal Uncle    Drug abuse Cousin     Social History   Tobacco Use  Smoking status: Former    Current packs/day: 0.00    Average packs/day: 0.3 packs/day for 1 year (0.3 ttl pk-yrs)    Types: Cigarettes    Start date: 08/14/2021    Quit date: 08/14/2022    Years since quitting: 1.1   Smokeless tobacco: Never  Vaping Use   Vaping status: Never Used  Substance Use Topics   Alcohol use: Yes    Comment: occasionally   Drug use: Not Currently    Types: Other-see comments, MDMA (Ecstacy), Marijuana    Review of Systems  Constitutional:  Negative for fever.  Gastrointestinal:   Negative for abdominal pain, nausea and vomiting.  Genitourinary:  Positive for vaginal bleeding and vaginal discharge. Negative for dysuria, flank pain, genital sores and hematuria.  Skin:  Positive for rash.     Objective:   Vitals: BP 135/82   Pulse 74   Temp 97.7 F (36.5 C) (Oral)   Resp 16   LMP 09/07/2023   SpO2 97%   Breastfeeding No   Physical Exam Constitutional:      General: She is not in acute distress.    Appearance: Normal appearance. She is well-developed and overweight. She is not ill-appearing or toxic-appearing.  HENT:     Head: Normocephalic and atraumatic.  Cardiovascular:     Rate and Rhythm: Normal rate and regular rhythm.     Heart sounds: Normal heart sounds.  Pulmonary:     Effort: Pulmonary effort is normal.     Breath sounds: Normal breath sounds.     Comments: Clear to auscultation bilaterally  Abdominal:     General: Bowel sounds are normal.     Palpations: Abdomen is soft.     Tenderness: There is no abdominal tenderness. There is no right CVA tenderness or left CVA tenderness.     Comments: No acute abdomen   Skin:    General: Skin is warm and dry.     Findings: Rash present. Rash is macular and papular.     Comments: Patient has pink maculopapular rash on right anterior elbow.  No warmth, erythema, discharge.  Slight crusting.  Neurological:     General: No focal deficit present.     Mental Status: She is alert.  Psychiatric:        Mood and Affect: Mood and affect normal.     Results:  Labs: No results found for this or any previous visit (from the past 24 hours).  Radiology: No results found.   UC Course/Treatments:  Procedures: Procedures   Medications Ordered in UC: Medications - No data to display   Assessment and Plan :     ICD-10-CM   1. Vaginal discharge  N89.8     2. Rash and nonspecific skin eruption  R21       Vaginal discharge Afebrile, nontoxic-appearing, NAD. VSS. DDX includes but not limited to:  BV, candidiasis, STD Cytology is pending.  Given increased discharge and odor, Flagyl 500 mg twice daily was prescribed.  Will adjust treatment plan based on cytology results.  Safe sex precautions advised. Strict ED precautions were given and patient verbalized understanding.  Rash and nonspecific skin eruption Afebrile, nontoxic-appearing, NAD. VSS. DDX includes but not limited to: Contact dermatitis, atopic dermatitis, urticaria, cellulitis, impetigo Given location and appearance, rash appears more consistent with atopic dermatitis.  Triamcinolone 0.1% 1 application twice daily was prescribed. Strict ED precautions were given and patient verbalized understanding.   ED Discharge Orders  Ordered    triamcinolone cream (KENALOG) 0.1 %  2 times daily        09/28/23 1339    metroNIDAZOLE (FLAGYL) 500 MG tablet  Every 12 hours        09/28/23 1339             PDMP not reviewed this encounter.     Cynda Acres, PA-C 09/28/23 1347

## 2023-09-29 ENCOUNTER — Telehealth (HOSPITAL_COMMUNITY): Payer: Self-pay

## 2023-09-29 LAB — CERVICOVAGINAL ANCILLARY ONLY
Bacterial Vaginitis (gardnerella): POSITIVE — AB
Candida Glabrata: NEGATIVE
Candida Vaginitis: NEGATIVE
Chlamydia: POSITIVE — AB
Comment: NEGATIVE
Comment: NEGATIVE
Comment: NEGATIVE
Comment: NEGATIVE
Comment: NEGATIVE
Comment: NORMAL
Neisseria Gonorrhea: NEGATIVE
Trichomonas: NEGATIVE

## 2023-09-29 MED ORDER — AZITHROMYCIN 250 MG PO TABS: 1000.0000 mg | ORAL_TABLET | Freq: Once | ORAL | 0 refills | Status: AC

## 2023-09-29 NOTE — Telephone Encounter (Signed)
For treatment of Chlamydia, pt requesting medication other than Doxycycline, which she states makes her "extremely sick." Azithromycin 1g x1 dose sent per protocol.  Reviewed with patient, verified pharmacy, prescription sent.

## 2023-11-14 ENCOUNTER — Telehealth: Admitting: Physician Assistant

## 2023-11-14 DIAGNOSIS — N760 Acute vaginitis: Secondary | ICD-10-CM

## 2023-11-14 DIAGNOSIS — B9689 Other specified bacterial agents as the cause of diseases classified elsewhere: Secondary | ICD-10-CM | POA: Diagnosis not present

## 2023-11-14 MED ORDER — METRONIDAZOLE 0.75 % VA GEL: 1.0000 | Freq: Every day | VAGINAL | 0 refills | Status: AC

## 2023-11-14 NOTE — Progress Notes (Signed)

## 2023-11-15 ENCOUNTER — Telehealth: Admitting: Physician Assistant

## 2023-11-15 DIAGNOSIS — B3731 Acute candidiasis of vulva and vagina: Secondary | ICD-10-CM

## 2023-11-15 MED ORDER — FLUCONAZOLE 150 MG PO TABS: 150.0000 mg | ORAL_TABLET | Freq: Once | ORAL | 0 refills | Status: AC

## 2023-11-15 NOTE — Progress Notes (Signed)

## 2023-11-26 ENCOUNTER — Telehealth: Admitting: Family Medicine

## 2023-11-26 DIAGNOSIS — J069 Acute upper respiratory infection, unspecified: Secondary | ICD-10-CM

## 2023-11-26 MED ORDER — FLUTICASONE PROPIONATE 50 MCG/ACT NA SUSP: 2.0000 | Freq: Every day | NASAL | 6 refills | Status: DC

## 2023-11-26 MED ORDER — BENZONATATE 200 MG PO CAPS: 200.0000 mg | ORAL_CAPSULE | Freq: Two times a day (BID) | ORAL | 0 refills | Status: DC | PRN

## 2023-11-26 NOTE — Progress Notes (Signed)

## 2024-01-09 ENCOUNTER — Telehealth: Admitting: Physician Assistant

## 2024-01-09 DIAGNOSIS — B9689 Other specified bacterial agents as the cause of diseases classified elsewhere: Secondary | ICD-10-CM | POA: Diagnosis not present

## 2024-01-09 DIAGNOSIS — N760 Acute vaginitis: Secondary | ICD-10-CM | POA: Diagnosis not present

## 2024-01-09 MED ORDER — METRONIDAZOLE 0.75 % VA GEL
1.0000 | Freq: Every day | VAGINAL | 0 refills | Status: AC
Start: 2024-01-09 — End: 2024-01-14

## 2024-01-09 NOTE — Progress Notes (Signed)
 I have spent 5 minutes in review of e-visit questionnaire, review and updating patient chart, medical decision making and response to patient.   Laure Kidney, PA-C

## 2024-01-09 NOTE — Progress Notes (Signed)

## 2024-01-11 ENCOUNTER — Telehealth: Admitting: Family Medicine

## 2024-01-11 DIAGNOSIS — B3731 Acute candidiasis of vulva and vagina: Secondary | ICD-10-CM

## 2024-01-11 MED ORDER — FLUCONAZOLE 150 MG PO TABS: 150.0000 mg | ORAL_TABLET | Freq: Every day | ORAL | 0 refills | Status: DC

## 2024-01-11 NOTE — Progress Notes (Signed)

## 2024-02-28 ENCOUNTER — Telehealth: Admitting: Family Medicine

## 2024-02-28 DIAGNOSIS — N760 Acute vaginitis: Secondary | ICD-10-CM

## 2024-02-28 NOTE — Progress Notes (Signed)
  Because we have treated you several times over the last few months for this, at this time you will need to follow up in person for swab testing. We feel your condition warrants further evaluation and we recommend that you be seen in a face-to-face visit at your PCP and or local urgent care.   NOTE: There will be NO CHARGE for this E-Visit   If you are having a true medical emergency, please call 911.

## 2024-03-16 ENCOUNTER — Telehealth: Admitting: Physician Assistant

## 2024-03-16 DIAGNOSIS — A6004 Herpesviral vulvovaginitis: Secondary | ICD-10-CM

## 2024-03-16 MED ORDER — VALACYCLOVIR HCL 500 MG PO TABS: 500.0000 mg | ORAL_TABLET | Freq: Two times a day (BID) | ORAL | 0 refills | Status: AC

## 2024-03-16 NOTE — Progress Notes (Signed)
 I have spent 5 minutes in review of e-visit questionnaire, review and updating patient chart, medical decision making and response to patient.   Piedad Climes, PA-C

## 2024-03-16 NOTE — Progress Notes (Signed)
 E-Visit for Herpes Simplex  We are sorry that you are not feeling well.  Here is how we plan to help!  Based on what you have shared ith me, it looks like you may be having an outbreak/flare-up of genital herpes.    I have prescribed I have prescribed Valacyclovir 500 mg Take one by mouth twice a day for 3 days.    If you have been prescribed long term medications to be taken on a regular basis, it is important to follow the recommendations and take them as ordered.    Outbreaks usually include blisters and open sores in the genital area. Outbreaks that happen after the first time are usually not as severe and do not last as long. Genital Herpes Simplex is a commonly sexually transmitted viral infection that is found worldwide. Most of these genital infections are caused by one or two herpes simplex viruses that is passed from person to person during vaginal, oral, or anal sex. Sometimes, people do not know they have herpes because they do not have any symptoms.  Please be aware that if you have genital herpes you can be contagious even when you are not having rash or flare-up and you may not have any symptoms, even when you are taking suppressive medicines.  Herpes cannot be cured. The disease usually causes most problems during the first few years. After that, the virus is still there, but it causes few to no symptoms. Even when the virus is active, people with herpes can take medicines to reduce and help prevent symptoms.  Herpes is an infection that can cause blisters and open sores on the genital area. Herpes is caused by a virus that is passed from person to person during vaginal, oral, or anal sex. Sometimes, people do not know they have herpes because they do not have any symptoms. Herpes cannot be cured. The disease usually causes most problems during the first few years. After that, the virus is still there, but it causes few to no symptoms. Even when the virus is active, people with herpes  can take medicines to reduce and help prevent symptoms.  If you have been prescribed medications to be taken on a regular basis, it is important to follow the recommendations and take them as ordered.  Some people with herpes never have any symptoms. But other people can develop symptoms within a few weeks of being infected with the herpes virus   Symptoms usually include blisters in the genital area. In women, this area includes the vagina, buttocks, anus, or thighs. In men, this area includes the penis, scrotum, anus, butt, or thighs. The blisters can become painful open sores, which then crust over as they heal. Sometimes, people can have other symptoms that include:  ?Blisters on the mouth or lips ?Fever, headache, or pain in the joints ?Trouble urinating  Outbreaks might occur every month or more often, or just once or twice a year. Sometimes, people can tell when an outbreak will occur, because they feel itching or pain beforehand. Sometimes they do not know that an outbreak is coming because they have no symptoms. Whatever your pattern is, keep in mind that herpes outbreaks usually become less frequent over time as you get older. Certain things, called "triggers," can make outbreaks more likely to occur. These include stress, sunlight, menstrual periods,or getting sick.  Antiviral therapy can shorten the duration of symptoms and signs in primary infection, which, when untreated, can be associated with significant increase in the  symptoms of the disease.  HOME CARE Use a portable bath (such as a "Sitz bath") where you can sit in warm water for about 20 minutes. Your bathtub could also work. Avoid bubble baths.  Keep the genital area clean and dry and avoid tight clothes.  Take over-the-counter pain medicine such as acetaminophen (brand name: Tylenol) or ibuprofen sample brand names: Advil, Motrin). But avoid aspirin.  Only take medications as instructed by your medical team.  You are  most likely to spread herpes to a sex partner when you have blisters and open sores on your body. But it's also possible to spread herpes to your partner when you do not have any symptoms. That is because herpes can be present on your body without causing any symptoms, like blisters or pain.  Telling your sex partner that you have herpes can be hard. But it can help protect them, since there are ways to lower the risk of spreading the infection.   Using a condom every time you have sex  Not having sex when you have symptoms  Not having oral sex if you have blisters or open sores (in the genital area or around your mouth)  MAKE SURE YOU   Understand these instructions. Do not have sex without using a condom until you have been seen by a doctor and as instructed by the provider If you are not better or improved within 7 days, you MUST have a follow up at your doctor or the health department for evaluation. There are other causes of rashes in the genital region.  Thank you for choosing an e-visit.  Your e-visit answers were reviewed by a board certified advanced clinical practitioner to complete your personal care plan. Depending upon the condition, your plan could have included both over the counter or prescription medications.  Please review your pharmacy choice. Make sure the pharmacy is open so you can pick up prescription now. If there is a problem, you may contact your provider through [employer] and have the prescription routed to another pharmacy.  Your safety is important to [place]. If you have drug allergies check your prescription carefully.   For the next 24 hours you can use MyChart to ask questions about today's visit, request a non-urgent call back, or ask for a work or school excuse. You will get an email in the next two days asking about your experience. I hope that your e-visit has been valuable and will speed your recovery.

## 2024-04-18 ENCOUNTER — Telehealth: Admitting: Physician Assistant

## 2024-04-18 DIAGNOSIS — N760 Acute vaginitis: Secondary | ICD-10-CM

## 2024-04-18 NOTE — Progress Notes (Signed)
  Because of multiple occurrences of vaginitis over the past few months treated virtually, and need for exam and testing at this point, I feel your condition warrants further evaluation and I recommend that you be seen in a face-to-face visit.   NOTE: There will be NO CHARGE for this E-Visit   If you are having a true medical emergency, please call 911.     For an urgent face to face visit, [place] has multiple urgent care centers for your convenience.  Click the link below for the full list of locations and hours, walk-in wait times, appointment scheduling options and driving directions:  Urgent Care - [place], [place], [place], [place], [place], [place]  [place]     Your MyChart E-visit questionnaire answers were reviewed by a board certified advanced clinical practitioner to complete your personal care plan based on your specific symptoms.    Thank you for using e-Visits.

## 2024-05-06 ENCOUNTER — Telehealth: Admitting: Physician Assistant

## 2024-05-06 DIAGNOSIS — T3695XA Adverse effect of unspecified systemic antibiotic, initial encounter: Secondary | ICD-10-CM

## 2024-05-06 DIAGNOSIS — B379 Candidiasis, unspecified: Secondary | ICD-10-CM | POA: Diagnosis not present

## 2024-05-06 MED ORDER — FLUCONAZOLE 150 MG PO TABS: ORAL_TABLET | ORAL | 0 refills | Status: DC

## 2024-05-06 NOTE — Progress Notes (Signed)

## 2024-05-06 NOTE — Progress Notes (Signed)
 I have spent 5 minutes in review of e-visit questionnaire, review and updating patient chart, medical decision making and response to patient.   Tracy Velma Lunger, PA-C

## 2024-05-14 ENCOUNTER — Telehealth: Admitting: Family

## 2024-05-14 DIAGNOSIS — R399 Unspecified symptoms and signs involving the genitourinary system: Secondary | ICD-10-CM

## 2024-05-14 MED ORDER — CEPHALEXIN 500 MG PO CAPS: 500.0000 mg | ORAL_CAPSULE | Freq: Two times a day (BID) | ORAL | 0 refills | Status: DC

## 2024-05-14 NOTE — Progress Notes (Signed)

## 2024-05-14 NOTE — Progress Notes (Signed)
Approximately 5 minutes was spent documenting and reviewing patient's chart.

## 2024-05-27 ENCOUNTER — Telehealth

## 2024-05-27 DIAGNOSIS — K649 Unspecified hemorrhoids: Secondary | ICD-10-CM | POA: Diagnosis not present

## 2024-05-28 MED ORDER — HYDROCORTISONE ACETATE 25 MG RE SUPP
25.0000 mg | Freq: Two times a day (BID) | RECTAL | 0 refills | Status: AC
Start: 2024-05-28 — End: ?

## 2024-05-28 MED ORDER — HYDROCORTISONE 2.5 % EX CREA: TOPICAL_CREAM | Freq: Two times a day (BID) | CUTANEOUS | 0 refills | Status: AC

## 2024-05-28 NOTE — Progress Notes (Signed)

## 2024-05-28 NOTE — Progress Notes (Signed)
Approximately 5 minutes was spent documenting and reviewing patient's chart.

## 2024-05-31 ENCOUNTER — Other Ambulatory Visit (HOSPITAL_COMMUNITY): Payer: Self-pay

## 2024-06-02 ENCOUNTER — Encounter: Payer: Self-pay | Admitting: Emergency Medicine

## 2024-06-02 ENCOUNTER — Ambulatory Visit
Admission: EM | Admit: 2024-06-02 | Discharge: 2024-06-02 | Disposition: A | Discharge status: Home / Self Care | Attending: Emergency Medicine | Admitting: Emergency Medicine

## 2024-06-02 DIAGNOSIS — K047 Periapical abscess without sinus: Secondary | ICD-10-CM | POA: Diagnosis not present

## 2024-06-02 MED ORDER — IBUPROFEN 800 MG PO TABS: 800.0000 mg | ORAL_TABLET | Freq: Three times a day (TID) | ORAL | 0 refills | Status: AC | PRN

## 2024-06-02 MED ORDER — PENICILLIN V POTASSIUM 500 MG PO TABS: 500.0000 mg | ORAL_TABLET | Freq: Three times a day (TID) | ORAL | 0 refills | Status: AC

## 2024-06-02 MED ORDER — IBUPROFEN 800 MG PO TABS
800.0000 mg | ORAL_TABLET | Freq: Once | ORAL | Status: AC
  Administered 2024-06-02: 800 mg via ORAL

## 2024-06-02 NOTE — Discharge Instructions (Addendum)
 Please reach out to [facility] to see if they can get you in any sooner.  According to their website, they do take Medicaid.  I have sent a prescription for penicillin  to your pharmacy.  Please take 1 tablet 3 times daily for the next 14 days.  I have also sent a prescription for ibuprofen  800 milligram tablets you can take every 8 hours as needed for pain.  Thank you for visiting [facility] today.

## 2024-06-02 NOTE — ED Provider Notes (Signed)
 Tracy Guzman    CSN: [id number] Arrival date & time: 06/02/24  1420    HISTORY   Chief Complaint  Patient presents with   Dental Pain   HPI Tracy Guzman is a pleasant, 28 y.o. female who presents to urgent care today. Patient complains of pain in her right lower wisdom tooth for the past 2 days.  Patient states has been keeping her awake at night and has noticed some swelling on the right side of her jaw as well.  Patient states she has an appointment to have her wisdom tooth removed but it is not until December, 2-1/2 months from now.  Patient states she has been taking alternating doses of Tylenol  and ibuprofen  and gargling with warm salt water  but this has not helped.  Patient denies fever, body aches, otalgia, difficulty chewing or swallowing.  The history is provided by the patient.  Dental Pain  Past Medical History:  Diagnosis Date   Abnormal prenatal ultrasound 03/12/2016   Allergy    Smoke, dust, Pollen   Anxiety 08/18/2017   Asthma    Depression    Drug use complicating pregnancy in third trimester 03/12/2016   Endometriosis    GERD (gastroesophageal reflux disease)    Oppositional defiant disorder 06/06/2012   Polysubstance abuse (HCC) 02/12/2012   Prenatal consult 01/20/2016   Formatting of this note might be different from the original. Suspect ASD.  Fine to deliver in [place] and come see me in clinic a few weeks after delivery. Formatting of this note might be different from the original. Suspect ASD.  Fine to deliver in [place] and come see me in clinic a few weeks after delivery.   Right lower quadrant pain 08/13/2011   Seasonal allergies 12/11/2014   Suicidal ideation 02/12/2012   Tobacco smoking complicating pregnancy in third trimester 03/12/2016   Patient Active Problem List   Diagnosis Date Noted   Indication for care in labor or delivery 03/09/2023   Post-dates pregnancy 03/09/2023   Unwanted fertility 12/08/2022   Rubella  non-immune status, antepartum 09/15/2022   Supervision of other normal pregnancy, antepartum 08/21/2022   H/O fetal anomaly in prior pregnancy, currently pregnant, second trimester 10/22/2020   MDD (major depressive disorder), recurrent episode, moderate (HCC) 08/16/2013   PTSD (post-traumatic stress disorder) 08/15/2013   Asthma 06/23/2010   GERD 06/23/2010   Past Surgical History:  Procedure Laterality Date   ADENOIDECTOMY     MYRINGOTOMY     TONSILLECTOMY AND ADENOIDECTOMY  as infant   TUBAL LIGATION N/A 03/10/2023   Procedure: POST PARTUM TUBAL LIGATION;  Surgeon: Alger Gong, MD;  Location: [facility] LD ORS;  Service: Gynecology;  Laterality: N/A;   WRIST SURGERY  age 4   OB History     Gravida  4   Para  4   Term  3   Preterm  1   AB      Living  4      SAB      IAB      Ectopic      Multiple  0   Live Births  4          Home Medications    Prior to Admission medications   Medication Sig Start Date End Date Taking? Authorizing Provider  escitalopram (LEXAPRO) 10 MG tablet Take 5 mg by mouth daily. 07/10/23   [provider]  hydrocortisone  (ANUSOL -HC) 25 MG suppository Place 1 suppository (25 mg total) rectally 2 (two) times daily. 05/28/24  Hawks, Christy A, FNP  hydrocortisone  2.5 % cream Apply topically 2 (two) times daily. 05/28/24   Lavell Bari LABOR, FNP  propranolol (INDERAL) 10 MG tablet Take 10 mg by mouth 3 (three) times daily as needed. 07/10/23   [provider]  valACYclovir  (VALTREX ) 1000 MG tablet Take 1,000 mg by mouth daily. 11/07/23   [provider]  valACYclovir  (VALTREX ) 500 MG tablet Take 1 tablet (500 mg total) by mouth 2 (two) times daily. 03/16/24   Gladis Elsie BROCKS, PA-C  mometasone -formoterol  (DULERA ) 100-5 MCG/ACT AERO Inhale 2 puffs into the lungs 2 (two) times daily.  04/18/20  [provider]    Family History Family History  Problem Relation Age of Onset   Cancer Maternal Grandmother     Depression Maternal Grandmother    Colon cancer Maternal Grandfather    Hypertension Father    Hyperlipidemia Father    Hypertension Mother    Depression Mother    Hyperlipidemia Mother    Drug abuse Maternal Uncle    Drug abuse Cousin    Social History Social History   Tobacco Use   Smoking status: Former    Current packs/day: 0.00    Average packs/day: 0.3 packs/day for 1 year (0.3 ttl pk-yrs)    Types: Cigarettes    Start date: 08/14/2021    Quit date: 08/14/2022    Years since quitting: 1.8   Smokeless tobacco: Never  Vaping Use   Vaping status: Never Used  Substance Use Topics   Alcohol use: Yes    Comment: occasionally   Drug use: Not Currently    Types: Other-see comments, MDMA (Ecstacy), Marijuana   Allergies   Doxycycline  and Lamotrigine  Review of Systems Review of Systems Pertinent findings revealed after performing a 14 point review of systems has been noted in the history of present illness.  Physical Exam Vital Signs Pulse 72   Temp 98.2 F (36.8 C) (Oral)   Resp 17   LMP 05/07/2024 (Exact Date)   SpO2 96%   No data found.  Physical Exam Vitals and nursing note reviewed.  Constitutional:      General: She is awake. She is not in acute distress.    Appearance: Normal appearance. She is well-developed and well-groomed. She is not ill-appearing.  HENT:     Head:      Mouth/Throat:   Neurological:     Mental Status: She is alert.  Psychiatric:        Behavior: Behavior is cooperative.     Visual Acuity Right Eye Distance:   Left Eye Distance:   Bilateral Distance:    Right Eye Near:   Left Eye Near:    Bilateral Near:     Guzman Couse / Diagnostics / Procedures:     Radiology No results found.  Procedures Procedures (including critical care time) EKG  Pending results:  Labs Reviewed - No data to display  Medications Ordered in Guzman: Medications  ibuprofen  (ADVIL ) tablet 800 mg (800 mg Oral Given 06/02/24 1504)    Guzman  Diagnoses / Final Clinical Impressions(s)   I have reviewed the triage vital signs and the nursing notes.  Pertinent labs & imaging results that were available during my care of the patient were reviewed by me and considered in my medical decision making (see chart for details).    Final diagnoses:  Infected tooth   Suspect odontogenic infection based on physical exam findings history provided by patient.  Patient provided with a 14-day course  of penicillin  and ibuprofen  100 mg.  Patient also provided with the name of another maxillofacial surgeon in [facility] that takes Medicaid.  Conservative care recommended.  Return precautions advised.  Please see discharge instructions below for details of plan of care as provided to patient. ED Prescriptions     Medication Sig Dispense Auth. Provider   penicillin  v potassium (VEETID) 500 MG tablet Take 1 tablet (500 mg total) by mouth 3 (three) times daily for 14 days. 42 tablet Joesph Shaver Scales, PA-C   ibuprofen  (ADVIL ) 800 MG tablet Take 1 tablet (800 mg total) by mouth every 8 (eight) hours as needed for up to 14 days. 42 tablet Joesph Shaver Scales, PA-C      PDMP not reviewed this encounter.  Pending results:  Labs Reviewed - No data to display    Discharge Instructions      Please reach out to [id number] to see if they can get you in any sooner.  According to their website, they do take Medicaid.  I have sent a prescription for penicillin  to your pharmacy.  Please take 1 tablet 3 times daily for the next 14 days.  I have also sent a prescription for ibuprofen  800 milligram tablets you can take every 8 hours as needed for pain.  Thank you for visiting [facility] today.       Disposition Upon Discharge:  Condition: stable for discharge home  Patient presented with an acute illness with associated systemic symptoms and significant discomfort requiring urgent management. In my  opinion, this is a condition that a prudent lay person (someone who possesses an average knowledge of health and medicine) may potentially expect to result in complications if not addressed urgently such as respiratory distress, impairment of bodily function or dysfunction of bodily organs.   Routine symptom specific, illness specific and/or disease specific instructions were discussed with the patient and/or caregiver at length.   As such, the patient has been evaluated and assessed, work-up was performed and treatment was provided in alignment with urgent care protocols and evidence based medicine.  Patient/parent/caregiver has been advised that the patient may require follow up for further testing and treatment if the symptoms continue in spite of treatment, as clinically indicated and appropriate.  Patient/parent/caregiver has been advised to return to the [facility] or PCP if no better; to PCP or the Emergency Department if new signs and symptoms develop, or if the current signs or symptoms continue to change or worsen for further workup, evaluation and treatment as clinically indicated and appropriate  The patient will follow up with their current PCP if and as advised. If the patient does not currently have a PCP we will assist them in obtaining one.   The patient may need specialty follow up if the symptoms continue, in spite of conservative treatment and management, for further workup, evaluation, consultation and treatment as clinically indicated and appropriate.  Patient/parent/caregiver verbalized understanding and agreement of plan as discussed.  All questions were addressed during visit.  Please see discharge instructions below for further details of plan.  This office note has been dictated using Teaching laboratory technician.  Unfortunately, this method of dictation can sometimes lead to typographical or grammatical errors.  I apologize for your inconvenience in advance if this occurs.   Please do not hesitate to reach out to me if clarification is needed.      Joesph Shaver Scales, PA-C 06/02/24 1544

## 2024-06-02 NOTE — ED Triage Notes (Signed)
 Pt reports right lower posterior dental pain for 2 days. Alternating tylenol  and ibuprofen  as well as gargle salt water . Repots made an appt to get wisdom teeth removed but not til December.

## 2024-06-06 ENCOUNTER — Other Ambulatory Visit (HOSPITAL_COMMUNITY): Payer: Self-pay

## 2024-06-20 ENCOUNTER — Telehealth: Admitting: Physician Assistant

## 2024-06-20 DIAGNOSIS — K649 Unspecified hemorrhoids: Secondary | ICD-10-CM

## 2024-06-20 NOTE — Progress Notes (Signed)
  Because of continued symptoms despite recent treatment via e-visit, I feel your condition warrants further evaluation and I recommend that you be seen in a face-to-face visit.   NOTE: There will be NO CHARGE for this E-Visit   If you are having a true medical emergency, please call 911.     For an urgent face to face visit, [place] has multiple urgent care centers for your convenience.  Click the link below for the full list of locations and hours, walk-in wait times, appointment scheduling options and driving directions:  Urgent Care - [place], [place], [place], [place], [place], [place]  [place]     Your MyChart E-visit questionnaire answers were reviewed by a board certified advanced clinical practitioner to complete your personal care plan based on your specific symptoms.    Thank you for using e-Visits.

## 2024-07-23 ENCOUNTER — Telehealth: Admitting: Family

## 2024-07-23 DIAGNOSIS — N760 Acute vaginitis: Secondary | ICD-10-CM

## 2024-07-23 DIAGNOSIS — B9689 Other specified bacterial agents as the cause of diseases classified elsewhere: Secondary | ICD-10-CM

## 2024-07-23 MED ORDER — METRONIDAZOLE 500 MG PO TABS: 500.0000 mg | ORAL_TABLET | Freq: Two times a day (BID) | ORAL | 0 refills | Status: AC

## 2024-07-23 NOTE — Progress Notes (Signed)
 We are sorry that you are not feeling well. Here is how we plan to help! Based on what you shared with me it looks like you: May have a vaginosis due to bacteria  Vaginosis is an inflammation of the vagina that can result in discharge, itching and pain. The cause is usually a change in the normal balance of vaginal bacteria or an infection. Vaginosis can also result from reduced estrogen levels after menopause.  The most common causes of vaginosis are:   Bacterial vaginosis which results from an overgrowth of one on several organisms that are normally present in your vagina.   Yeast infections which are caused by a naturally occurring fungus called candida.   Vaginal atrophy (atrophic vaginosis) which results from the thinning of the vagina from reduced estrogen levels after menopause.   Trichomoniasis which is caused by a parasite and is commonly transmitted by sexual intercourse.  Factors that increase your risk of developing vaginosis include: Medications, such as antibiotics and steroids Uncontrolled diabetes Use of hygiene products such as bubble bath, vaginal spray or vaginal deodorant Douching Wearing damp or tight-fitting clothing Using an intrauterine device (IUD) for birth control Hormonal changes, such as those associated with pregnancy, birth control pills or menopause Sexual activity Having a sexually transmitted infection  Your treatment plan is Metronidazole  or Flagyl  500mg  twice a day for 7 days.  I have electronically sent this prescription into the pharmacy that you have chosen.  Be sure to take all of the medication as directed. Stop taking any medication if you develop a rash, tongue swelling or shortness of breath. Mothers who are breast feeding should consider pumping and discarding their breast milk while on these antibiotics. However, there is no consensus that infant exposure at these doses would be harmful.  Remember that medication creams can weaken latex  condoms.   HOME CARE:  Good hygiene may prevent some types of vaginosis from recurring and may relieve some symptoms:  Avoid baths, hot tubs and whirlpool spas. Rinse soap from your outer genital area after a shower, and dry the area well to prevent irritation. Don't use scented or harsh soaps, such as those with deodorant or antibacterial action. Avoid irritants. These include scented tampons and pads. Wipe from front to back after using the toilet. Doing so avoids spreading fecal bacteria to your vagina.  Other things that may help prevent vaginosis include:  Don't douche. Your vagina doesn't require cleansing other than normal bathing. Repetitive douching disrupts the normal organisms that reside in the vagina and can actually increase your risk of vaginal infection. Douching won't clear up a vaginal infection. Use a latex condom. Both female and female latex condoms may help you avoid infections spread by sexual contact. Wear cotton underwear. Also wear pantyhose with a cotton crotch. If you feel comfortable without it, skip wearing underwear to bed. Yeast thrives in [employer] Your symptoms should improve in the next day or two.  GET HELP RIGHT AWAY IF:  You have pain in your lower abdomen ( pelvic area or over your ovaries) You develop nausea or vomiting You develop a fever Your discharge changes or worsens You have persistent pain with intercourse You develop shortness of breath, a rapid pulse, or you faint.  These symptoms could be signs of problems or infections that need to be evaluated by a medical provider now.  MAKE SURE YOU   Understand these instructions. Will watch your condition. Will get help right away if you are not doing  well or get worse.  Your e-visit answers were reviewed by a board certified advanced clinical practitioner to complete your personal care plan. Depending upon the condition, your plan could have included both over the counter or  prescription medications. Please review your pharmacy choice to make sure that you have choses a pharmacy that is open for you to pick up any needed prescription, Your safety is important to us . If you have drug allergies check your prescription carefully.   You can use MyChart to ask questions about today's visit, request a non-urgent call back, or ask for a work or school excuse for 24 hours related to this e-Visit. If it has been greater than 24 hours you will need to follow up with your provider, or enter a new e-Visit to address those concerns. You will get a MyChart message within the next two days asking about your experience. I hope that your e-visit has been valuable and will speed your recovery.  I have spent 5 minutes in review of e-visit questionnaire, review and updating patient chart, medical decision making and response to patient.   Bari Learn, FNP

## 2024-09-11 ENCOUNTER — Telehealth: Admitting: Physician Assistant

## 2024-09-11 DIAGNOSIS — N760 Acute vaginitis: Secondary | ICD-10-CM

## 2024-09-11 DIAGNOSIS — B9689 Other specified bacterial agents as the cause of diseases classified elsewhere: Secondary | ICD-10-CM | POA: Diagnosis not present

## 2024-09-11 MED ORDER — METRONIDAZOLE 0.75 % VA GEL: 1.0000 | Freq: Every day | VAGINAL | 0 refills | Status: AC

## 2024-09-11 NOTE — Progress Notes (Signed)
 We are sorry that you are not feeling well. Here is how we plan to help! Based on what you shared with me it looks like you: May have a vaginosis due to bacteria  Vaginosis is an inflammation of the vagina that can result in discharge, itching and pain. The cause is usually a change in the normal balance of vaginal bacteria or an infection. Vaginosis can also result from reduced estrogen levels after menopause.  The most common causes of vaginosis are:   Bacterial vaginosis which results from an overgrowth of one on several organisms that are normally present in your vagina.   Yeast infections which are caused by a naturally occurring fungus called candida.   Vaginal atrophy (atrophic vaginosis) which results from the thinning of the vagina from reduced estrogen levels after menopause.   Trichomoniasis which is caused by a parasite and is commonly transmitted by sexual intercourse.  Factors that increase your risk of developing vaginosis include: Medications, such as antibiotics and steroids Uncontrolled diabetes Use of hygiene products such as bubble bath, vaginal spray or vaginal deodorant Douching Wearing damp or tight-fitting clothing Using an intrauterine device (IUD) for birth control Hormonal changes, such as those associated with pregnancy, birth control pills or menopause Sexual activity Having a sexually transmitted infection  Your treatment plan is Metronidazole  vaginal gel Use one applicatorful nightly at bedtime for 7 days.  I have electronically sent this prescription into the pharmacy that you have chosen.  Be sure to take all of the medication as directed. Stop taking any medication if you develop a rash, tongue swelling or shortness of breath. Mothers who are breast feeding should consider pumping and discarding their breast milk while on these antibiotics. However, there is no consensus that infant exposure at these doses would be harmful.  Remember that medication  creams can weaken latex condoms.   HOME CARE:  Good hygiene may prevent some types of vaginosis from recurring and may relieve some symptoms:  Avoid baths, hot tubs and whirlpool spas. Rinse soap from your outer genital area after a shower, and dry the area well to prevent irritation. Don't use scented or harsh soaps, such as those with deodorant or antibacterial action. Avoid irritants. These include scented tampons and pads. Wipe from front to back after using the toilet. Doing so avoids spreading fecal bacteria to your vagina.  Other things that may help prevent vaginosis include:  Don't douche. Your vagina doesn't require cleansing other than normal bathing. Repetitive douching disrupts the normal organisms that reside in the vagina and can actually increase your risk of vaginal infection. Douching won't clear up a vaginal infection. Use a latex condom. Both female and female latex condoms may help you avoid infections spread by sexual contact. Wear cotton underwear. Also wear pantyhose with a cotton crotch. If you feel comfortable without it, skip wearing underwear to bed. Yeast thrives in [employer] Your symptoms should improve in the next day or two.  GET HELP RIGHT AWAY IF:  You have pain in your lower abdomen ( pelvic area or over your ovaries) You develop nausea or vomiting You develop a fever Your discharge changes or worsens You have persistent pain with intercourse You develop shortness of breath, a rapid pulse, or you faint.  These symptoms could be signs of problems or infections that need to be evaluated by a medical provider now.  MAKE SURE YOU   Understand these instructions. Will watch your condition. Will get help right away if you are  not doing well or get worse.  Your e-visit answers were reviewed by a board certified advanced clinical practitioner to complete your personal care plan. Depending upon the condition, your plan could have included both over  the counter or prescription medications. Please review your pharmacy choice to make sure that you have choses a pharmacy that is open for you to pick up any needed prescription, Your safety is important to us . If you have drug allergies check your prescription carefully.   You can use MyChart to ask questions about todays visit, request a non-urgent call back, or ask for a work or school excuse for 24 hours related to this e-Visit. If it has been greater than 24 hours you will need to follow up with your provider, or enter a new e-Visit to address those concerns. You will get a MyChart message within the next two days asking about your experience. I hope that your e-visit has been valuable and will speed your recovery.  I have spent 5 minutes in review of e-visit questionnaire, review and updating patient chart, medical decision making and response to patient.   Delon CHRISTELLA Dickinson, PA-C
